# Patient Record
Sex: Female | Born: 1970 | Race: Black or African American | Hispanic: No | Marital: Single | Smoking: Current every day smoker
Health system: Southern US, Community
[De-identification: ages and names within clinical notes are randomized; demographics above are authoritative.]

## PROBLEM LIST (undated history)

## (undated) DIAGNOSIS — I10 Essential (primary) hypertension: Secondary | ICD-10-CM

## (undated) DIAGNOSIS — Z8669 Personal history of other diseases of the nervous system and sense organs: Secondary | ICD-10-CM

## (undated) DIAGNOSIS — K859 Acute pancreatitis without necrosis or infection, unspecified: Secondary | ICD-10-CM

## (undated) HISTORY — PX: LEEP: SHX91

---

## 1998-06-29 ENCOUNTER — Other Ambulatory Visit: Admission: RE | Admit: 1998-06-29 | Discharge: 1998-06-29 | Payer: Self-pay | Admitting: Obstetrics

## 1998-09-14 ENCOUNTER — Other Ambulatory Visit: Admission: RE | Admit: 1998-09-14 | Discharge: 1998-09-14 | Payer: Self-pay | Admitting: Obstetrics & Gynecology

## 1998-09-14 ENCOUNTER — Encounter: Admission: RE | Admit: 1998-09-14 | Discharge: 1998-09-14 | Payer: Self-pay | Admitting: Obstetrics & Gynecology

## 1998-10-19 ENCOUNTER — Encounter: Admission: RE | Admit: 1998-10-19 | Discharge: 1998-10-19 | Payer: Self-pay | Admitting: Obstetrics & Gynecology

## 1999-12-31 ENCOUNTER — Encounter: Admission: RE | Admit: 1999-12-31 | Discharge: 1999-12-31 | Payer: Self-pay | Admitting: Obstetrics & Gynecology

## 2000-02-18 ENCOUNTER — Encounter: Admission: RE | Admit: 2000-02-18 | Discharge: 2000-02-18 | Payer: Self-pay | Admitting: Obstetrics & Gynecology

## 2001-02-02 ENCOUNTER — Other Ambulatory Visit: Admission: RE | Admit: 2001-02-02 | Discharge: 2001-02-02 | Payer: Self-pay | Admitting: Internal Medicine

## 2001-11-12 ENCOUNTER — Emergency Department (HOSPITAL_COMMUNITY): Admission: EM | Admit: 2001-11-12 | Discharge: 2001-11-12 | Payer: Self-pay

## 2002-07-11 ENCOUNTER — Emergency Department (HOSPITAL_COMMUNITY): Admission: EM | Admit: 2002-07-11 | Discharge: 2002-07-11 | Payer: Self-pay | Admitting: Emergency Medicine

## 2002-10-18 ENCOUNTER — Inpatient Hospital Stay (HOSPITAL_COMMUNITY): Admission: AD | Admit: 2002-10-18 | Discharge: 2002-10-18 | Payer: Self-pay | Admitting: *Deleted

## 2004-08-05 ENCOUNTER — Inpatient Hospital Stay (HOSPITAL_COMMUNITY): Admission: AD | Admit: 2004-08-05 | Discharge: 2004-08-05 | Payer: Self-pay | Admitting: Obstetrics & Gynecology

## 2004-08-05 ENCOUNTER — Ambulatory Visit: Payer: Self-pay | Admitting: Certified Nurse Midwife

## 2004-10-12 ENCOUNTER — Ambulatory Visit: Payer: Self-pay | Admitting: Obstetrics and Gynecology

## 2004-10-12 ENCOUNTER — Inpatient Hospital Stay (HOSPITAL_COMMUNITY): Admission: AD | Admit: 2004-10-12 | Discharge: 2004-10-14 | Payer: Self-pay | Admitting: Obstetrics & Gynecology

## 2005-05-23 ENCOUNTER — Emergency Department (HOSPITAL_COMMUNITY): Admission: EM | Admit: 2005-05-23 | Discharge: 2005-05-23 | Payer: Self-pay | Admitting: Emergency Medicine

## 2005-05-23 IMAGING — US US OB COMP LESS 14 WK
1 series · 14 of 28 positions shown · non-contrast
Comparison: None.

CLINICAL DATA: Vaginal bleeding since [DATE].  Beta hCG [ZR] today.
OBSTETRICAL ULTRASOUND <14 WKS AND TRANSVAGINAL OB US:
TECHNIQUE: Both transabdominal and transvaginal ultrasound examinations were performed for complete evaluation of the gestation as well as the maternal uterus, adnexal regions, and pelvic cul-de-sac.

[Series 1: unknown · 0.28mm/px · 14 of 58 slices shown]
[im 3/58]
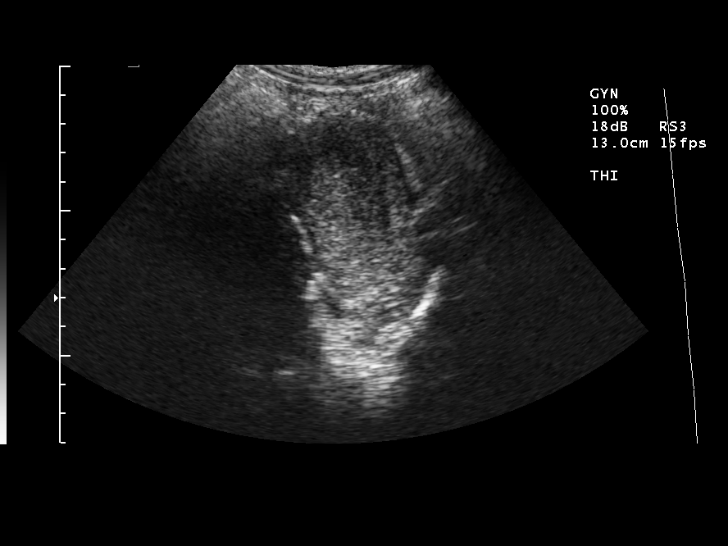
[im 7/58]
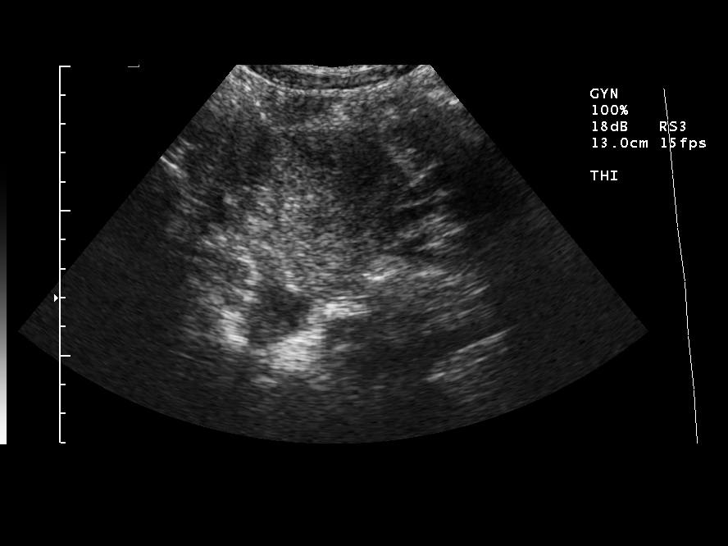
[im 11/58]
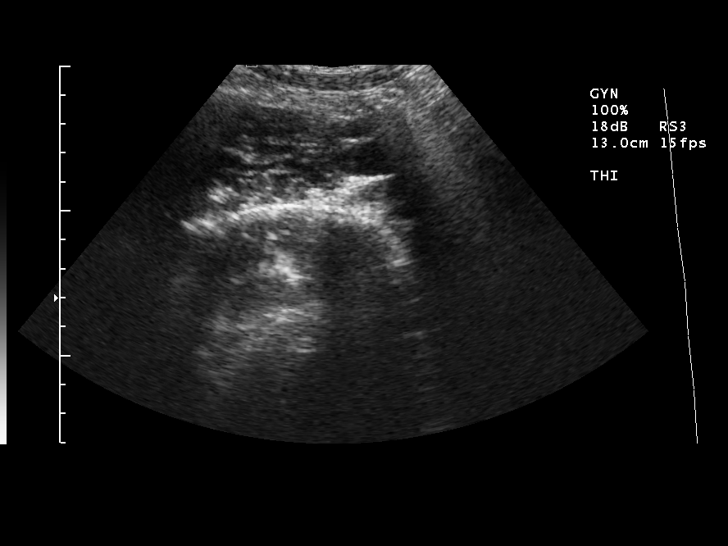
[im 15/58]
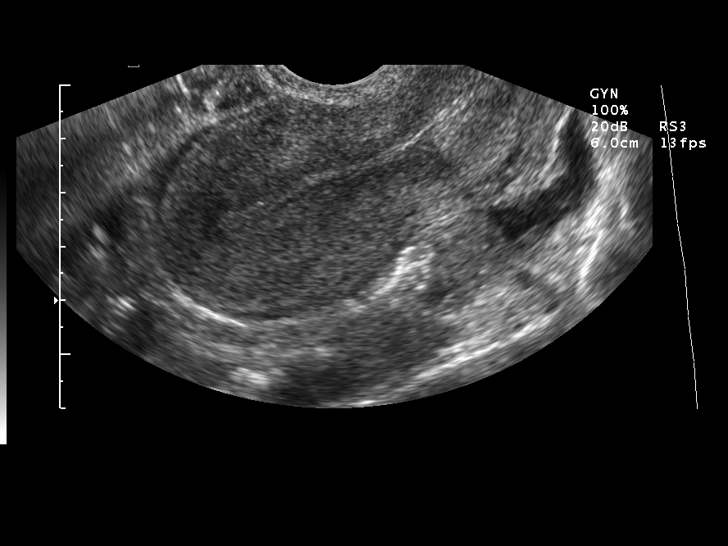
[im 20/58]
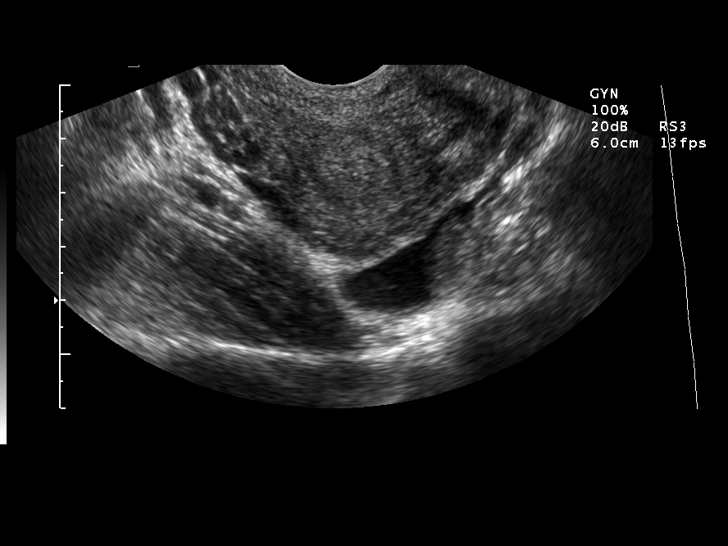
[im 24/58]
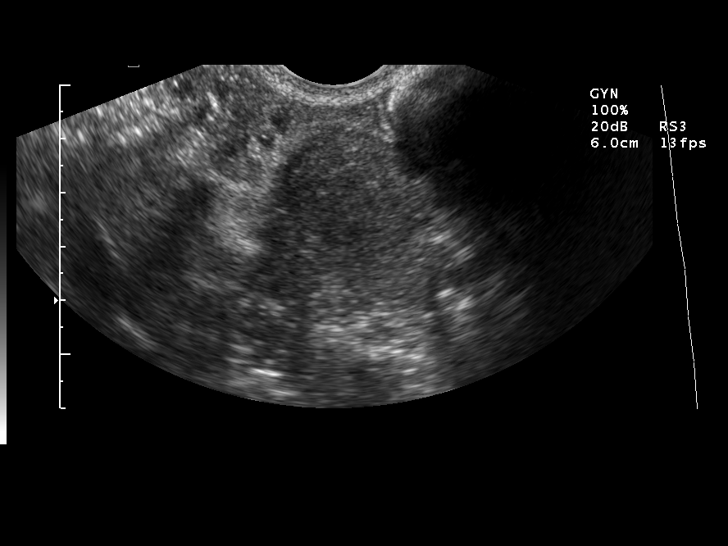
[im 28/58]
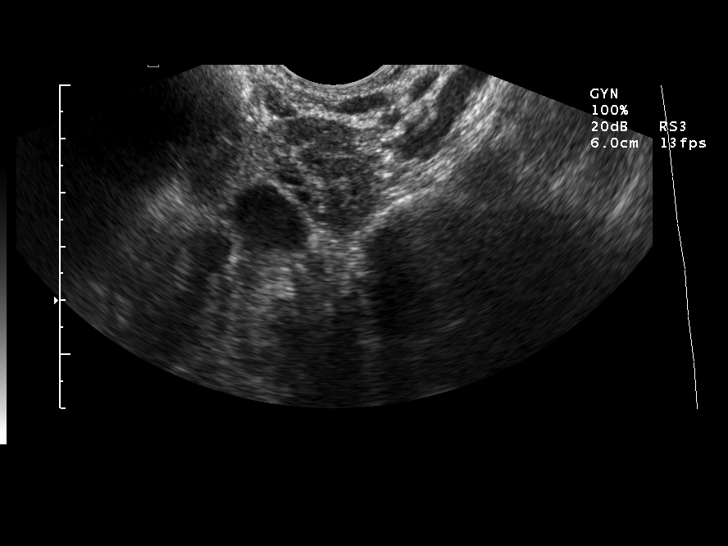
[im 32/58]
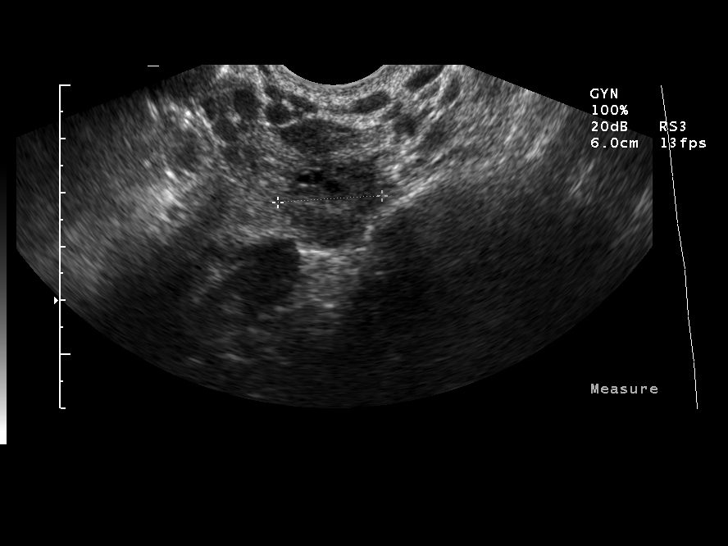
[im 36/58]
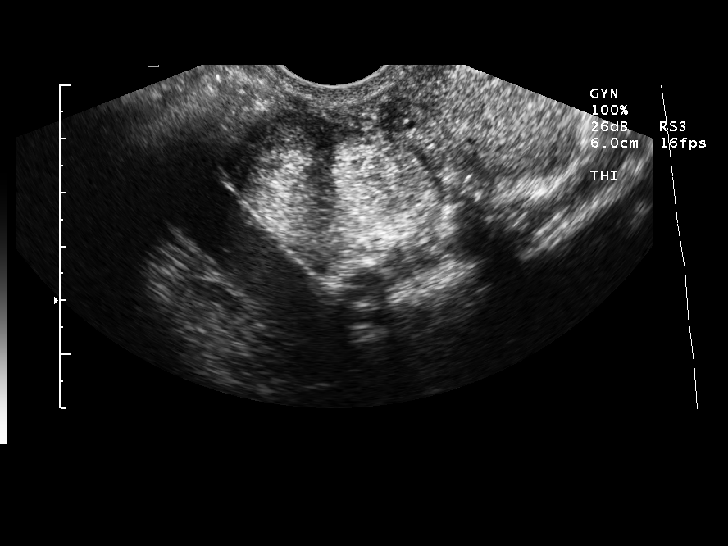
[im 41/58]
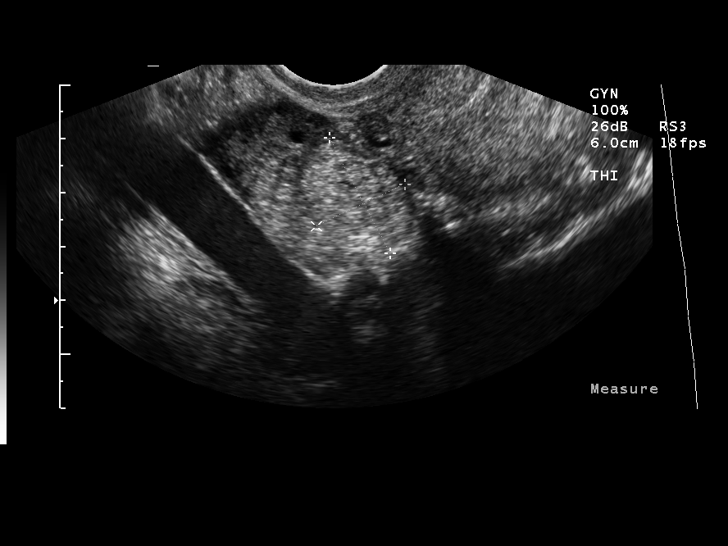
[im 45/58]
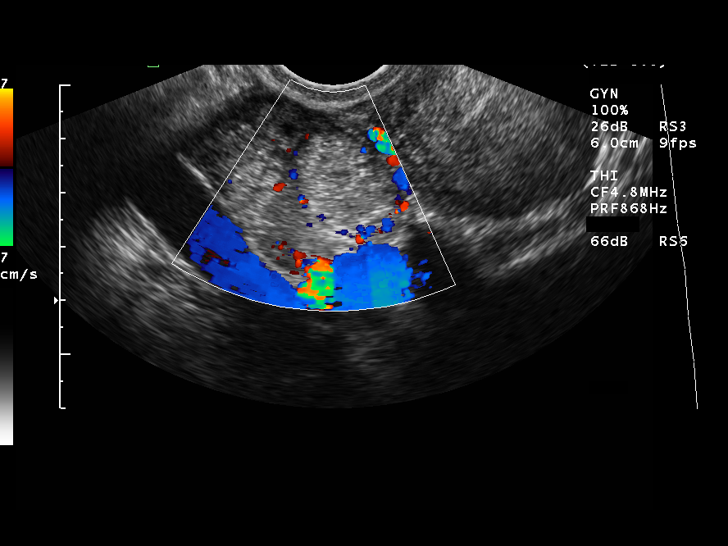
[im 49/58]
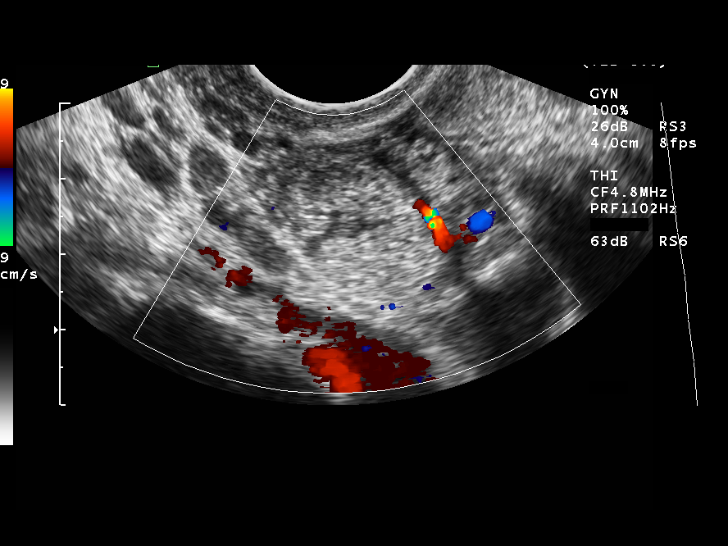
[im 53/58]
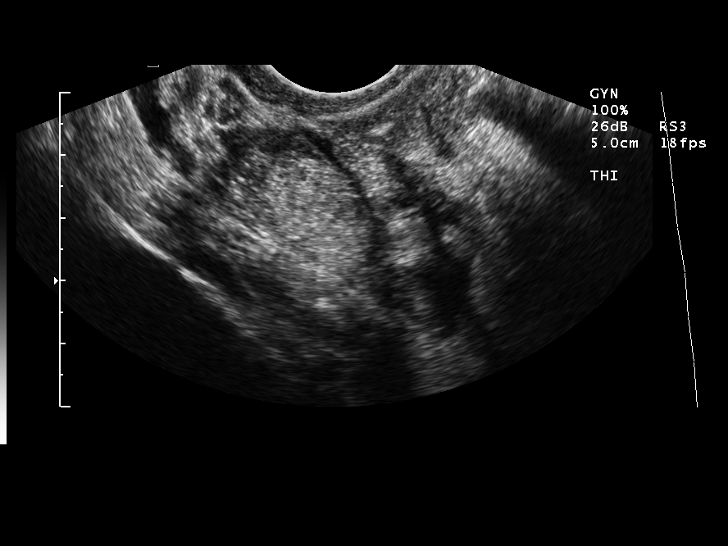
[im 58/58]
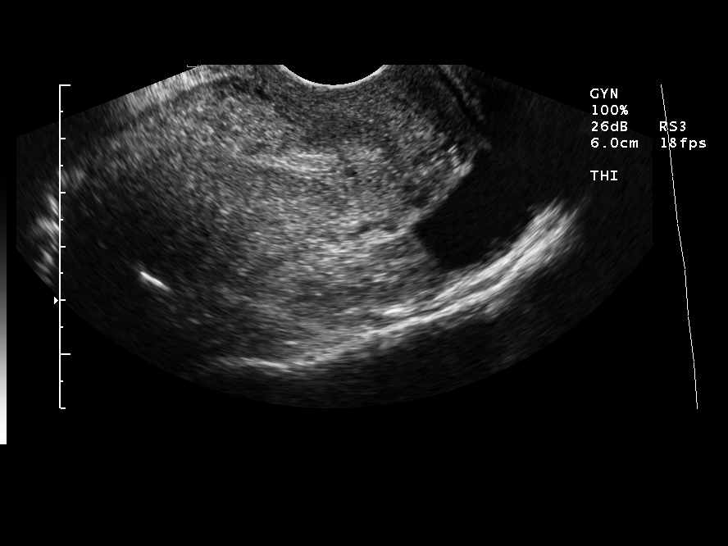

[14 of 28 positions shown; findings below may reference images not displayed]

FINDINGS: No intrauterine gestational sac is identified.  Endometrium measures 3.9 mm and is normal.  
The left ovary measures 25 x 18 mm and appears normal.  The right ovary measures 24 x 18 mm and appears normal with some small collapsed follicles.  There is a small amount of free fluid.  No adnexal mass is identified.
IMPRESSION: Negative for intrauterine pregnancy.  Findings could be due to ectopic pregnancy given the small amount of free fluid.  It is also possible that the patient has had a spontaneous abortion.  Continued  follow-up beta hCG and ultrasound is suggested.

## 2005-11-08 ENCOUNTER — Emergency Department (HOSPITAL_COMMUNITY): Admission: EM | Admit: 2005-11-08 | Discharge: 2005-11-08 | Payer: Self-pay | Admitting: Emergency Medicine

## 2005-11-08 IMAGING — CR DG HAND COMPLETE 3+V*R*
3 series · 3 of 3 positions shown · non-contrast
Comparison: none

CLINICAL DATA: 35 year old with question of fracture.  Right hand pain.
 RIGHT HAND COMPLETE ? 3 VIEWS:

[view not recorded (1 of 3)]
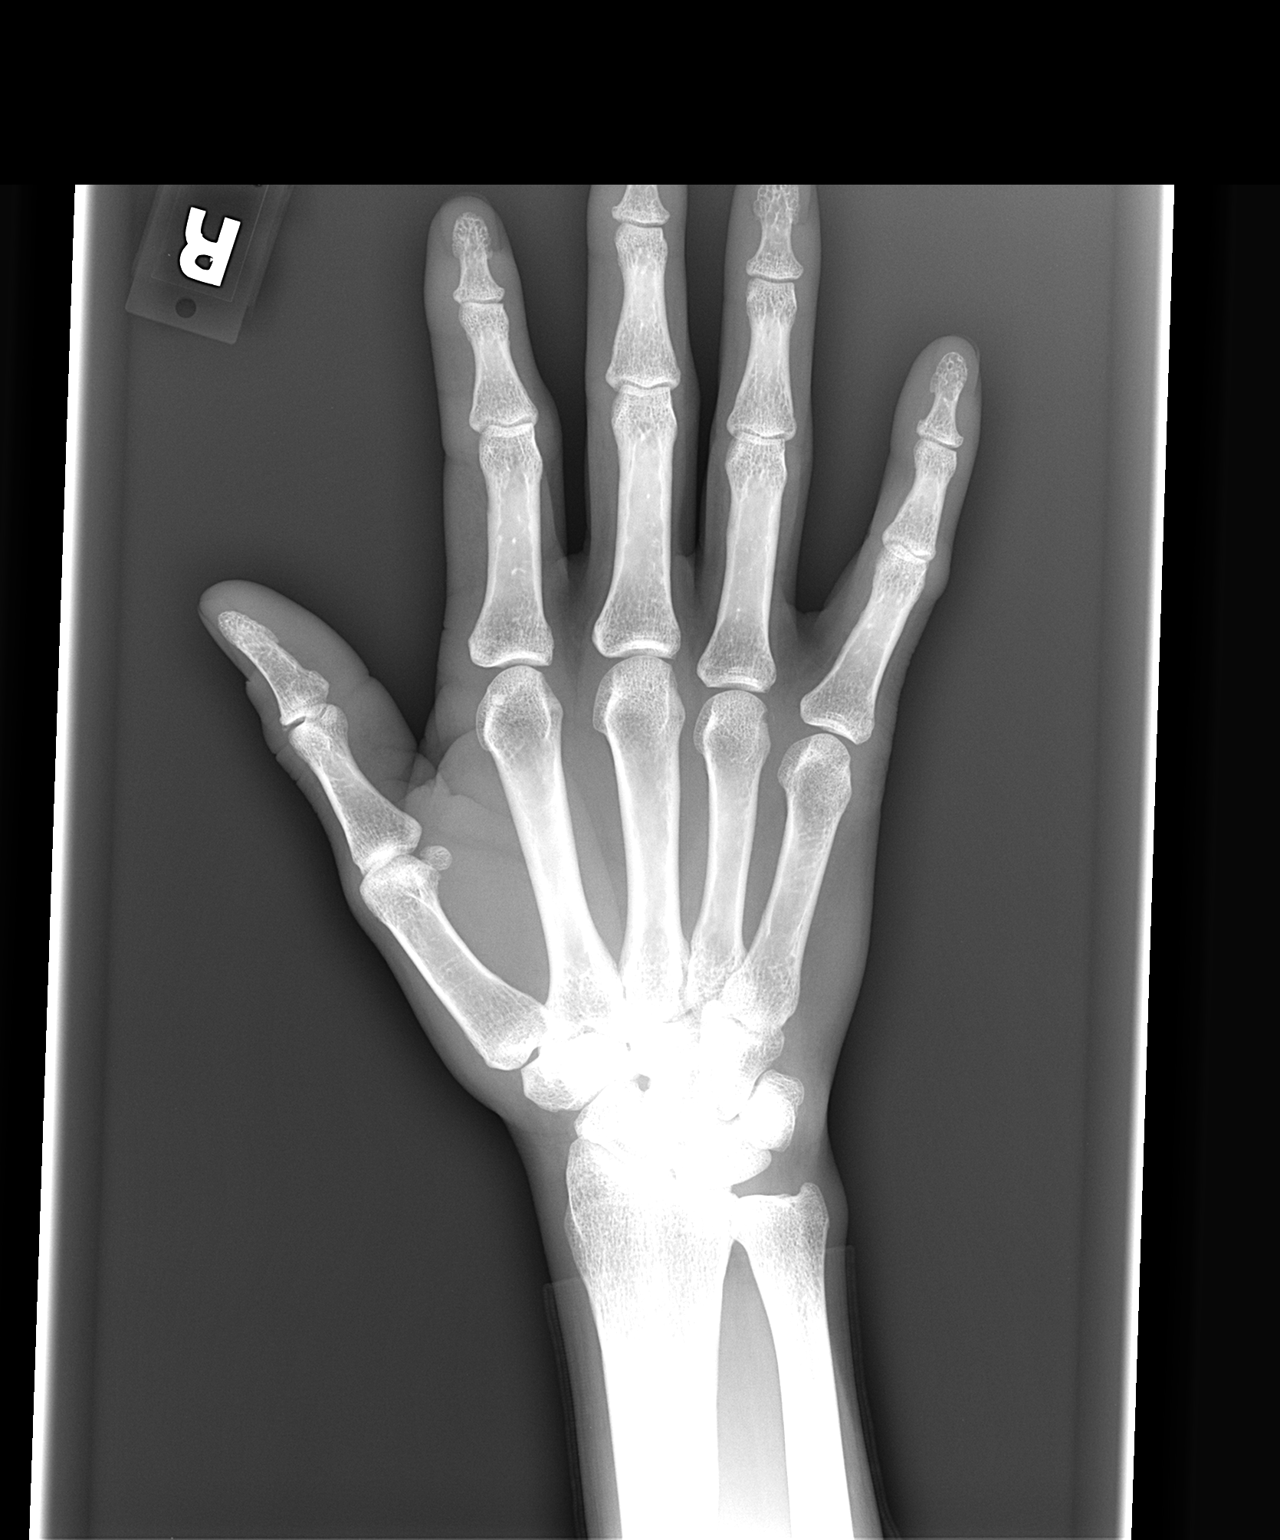

[view not recorded (2 of 3)]
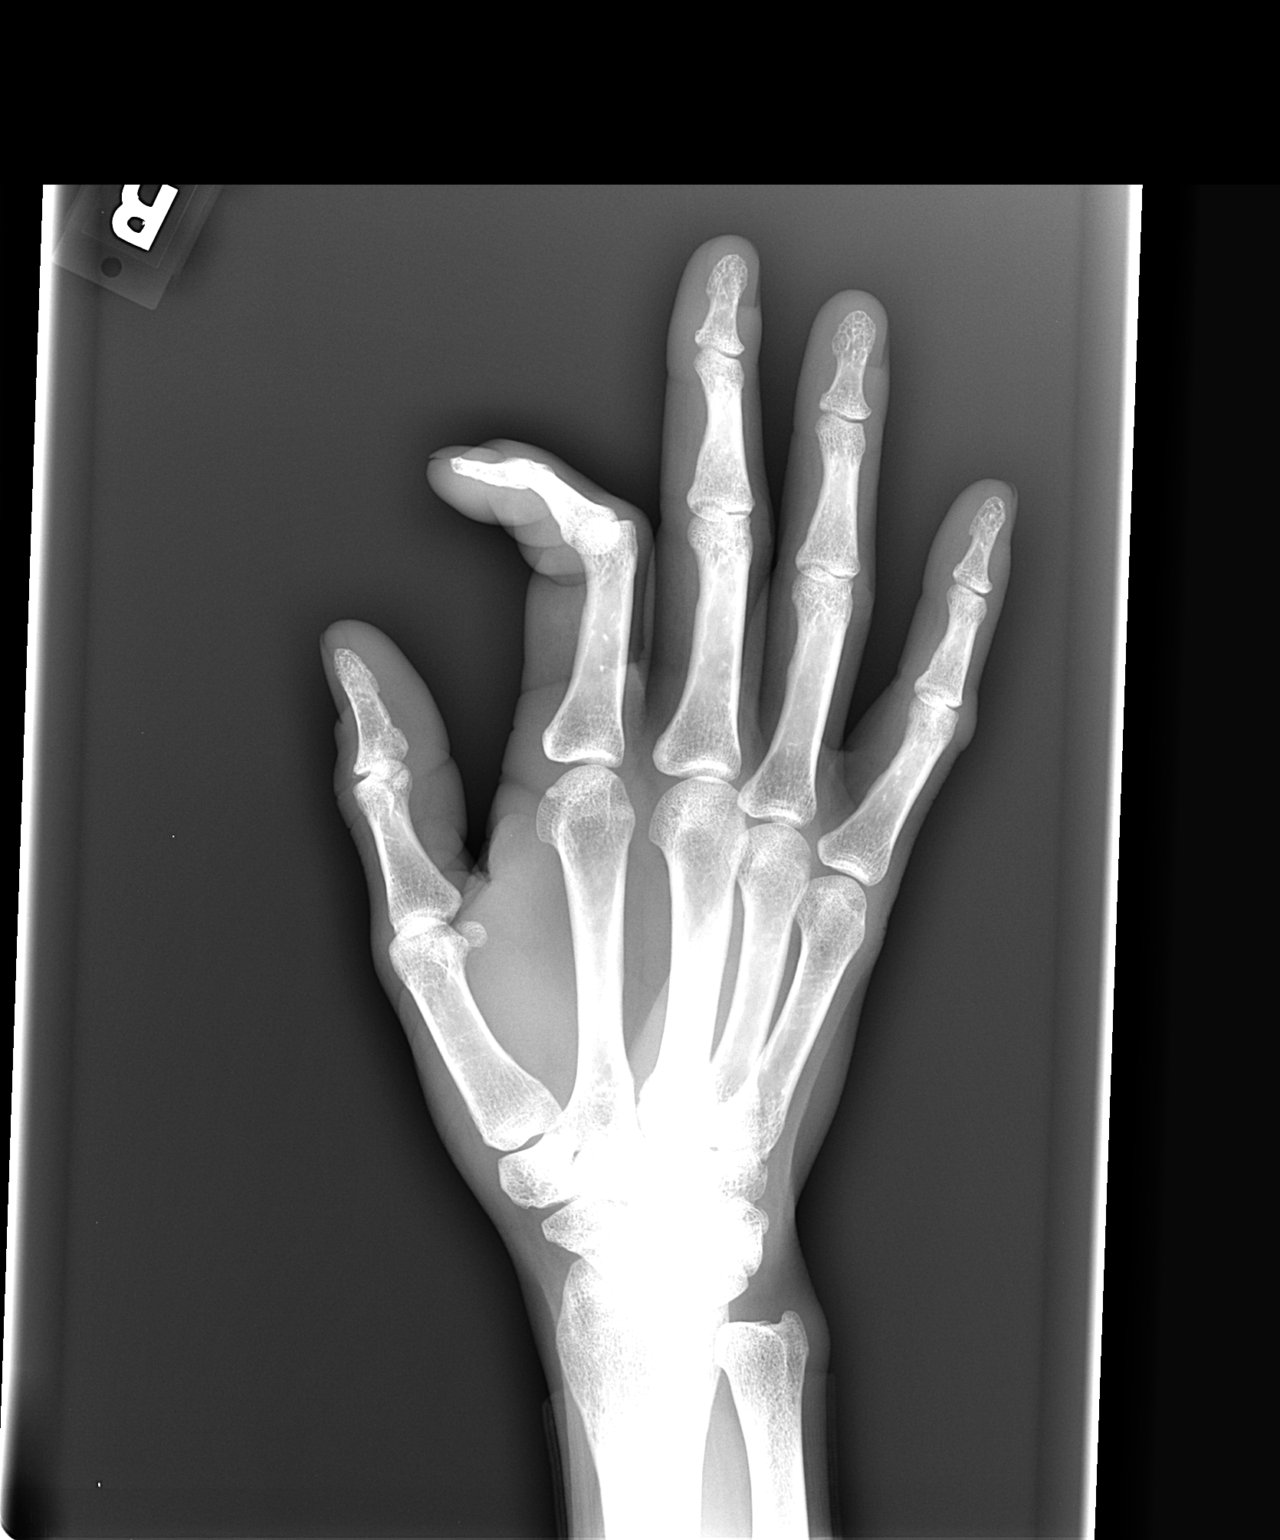

[view not recorded (3 of 3)]
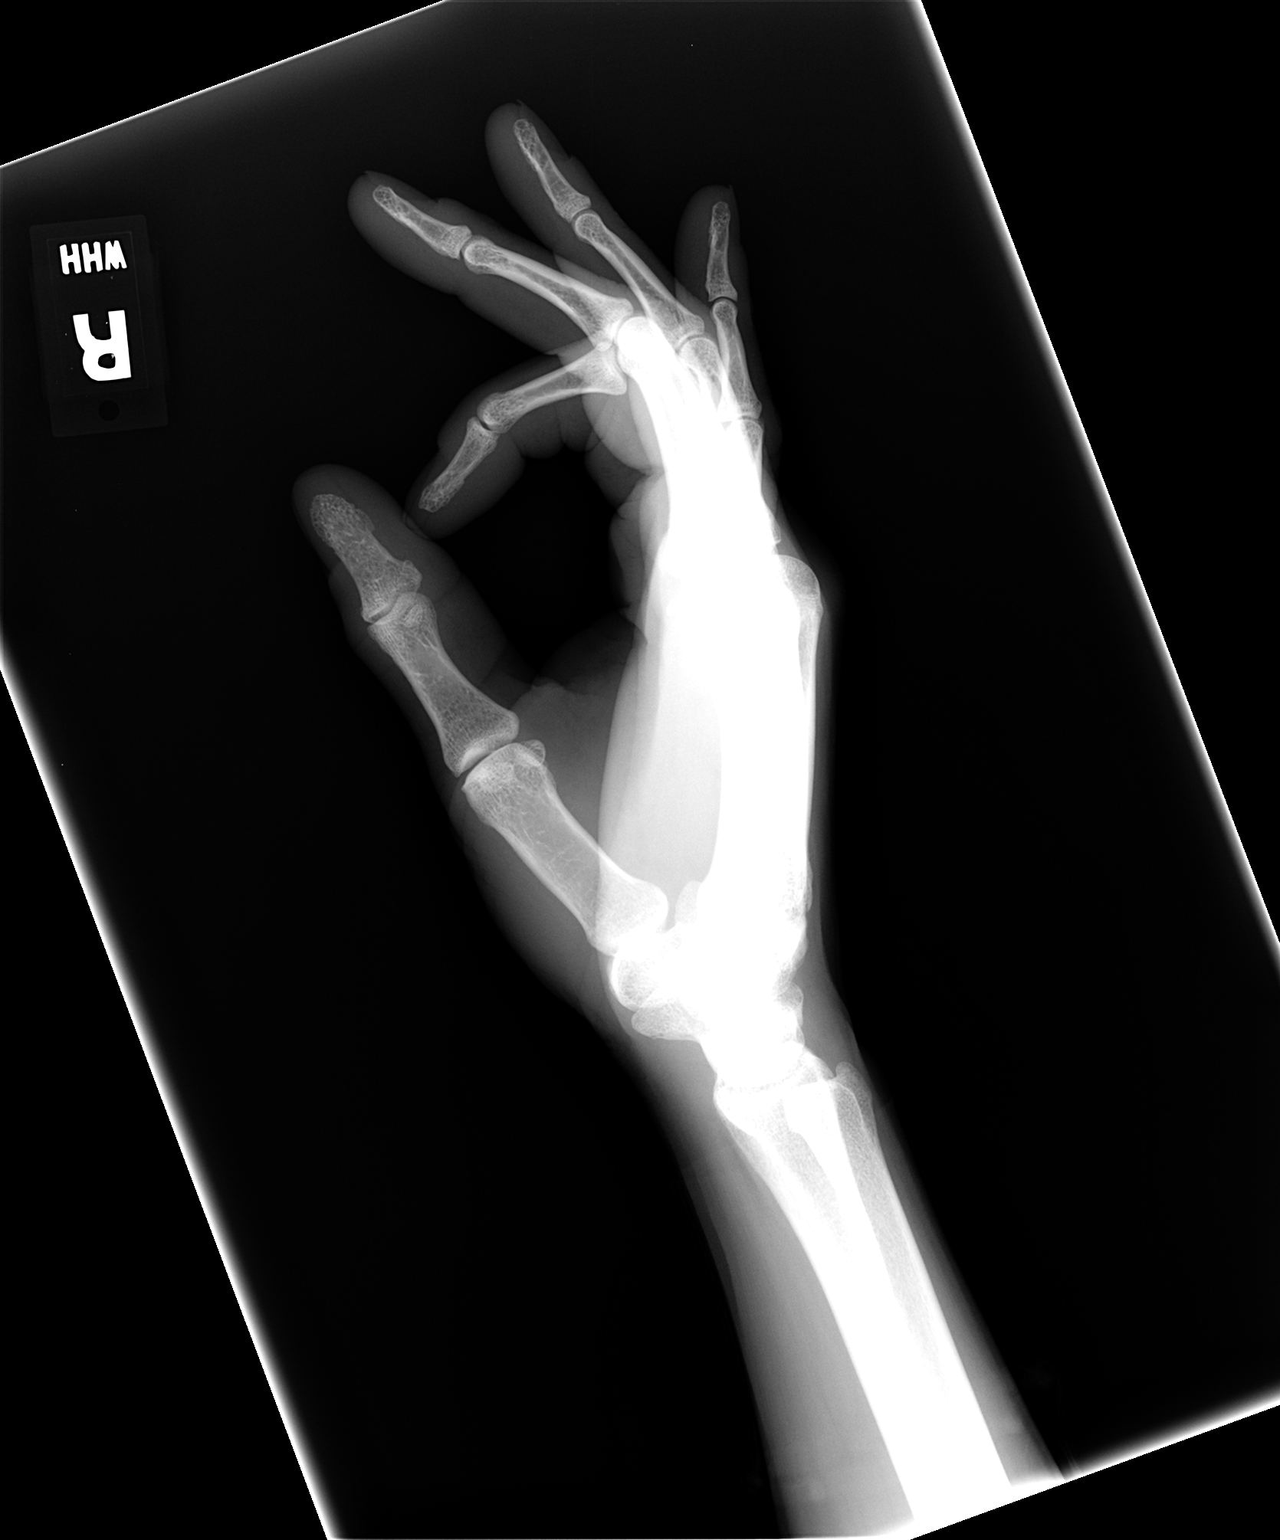

[3 of 3 positions shown; findings below may reference images not displayed]

FINDINGS: Two of the three views are performed with the patient flexing the second digit at the proximal interphalangeal joint.  There is no evidence for acute fracture or dislocation otherwise.
IMPRESSION: No evidence for acute abnormality.

## 2006-04-15 ENCOUNTER — Emergency Department (HOSPITAL_COMMUNITY): Admission: EM | Admit: 2006-04-15 | Discharge: 2006-04-15 | Payer: Self-pay | Admitting: Emergency Medicine

## 2006-12-26 ENCOUNTER — Emergency Department (HOSPITAL_COMMUNITY): Admission: EM | Admit: 2006-12-26 | Discharge: 2006-12-26 | Payer: Self-pay | Admitting: Emergency Medicine

## 2006-12-26 IMAGING — CR DG HAND COMPLETE 3+V*R*
3 series · 3 of 3 positions shown · non-contrast
Comparison: [DATE]

CLINICAL DATA: Trauma

RIGHT HAND - 3 VIEW

[x hand pa right]
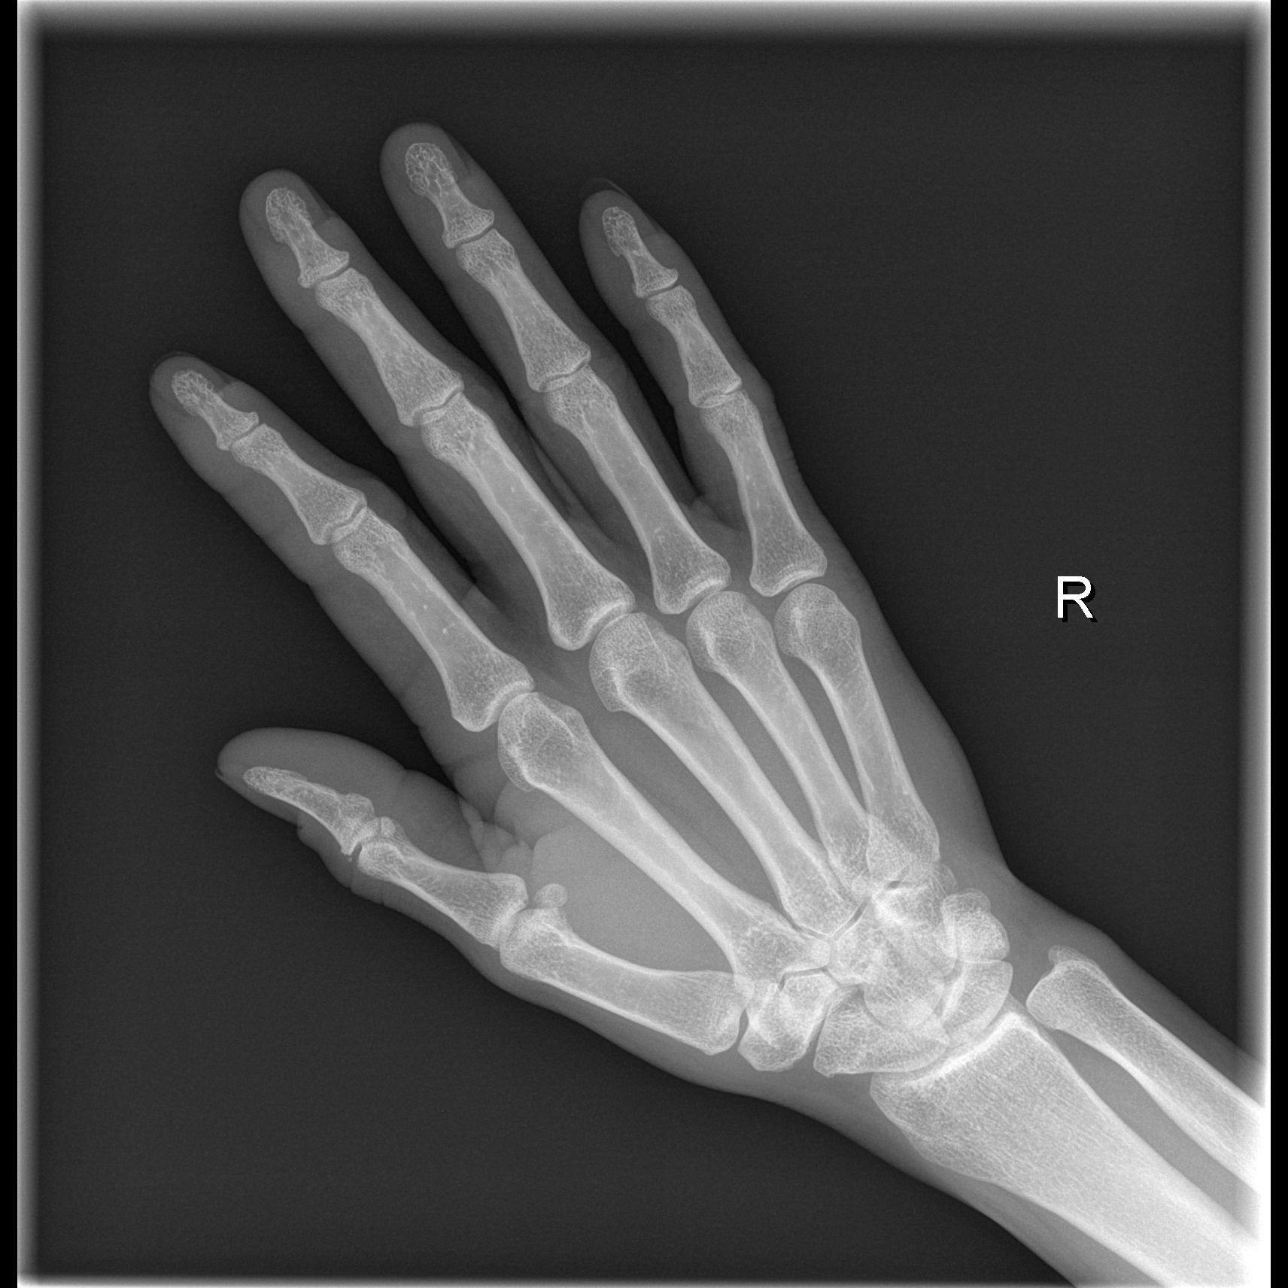

[x hand oblique right]
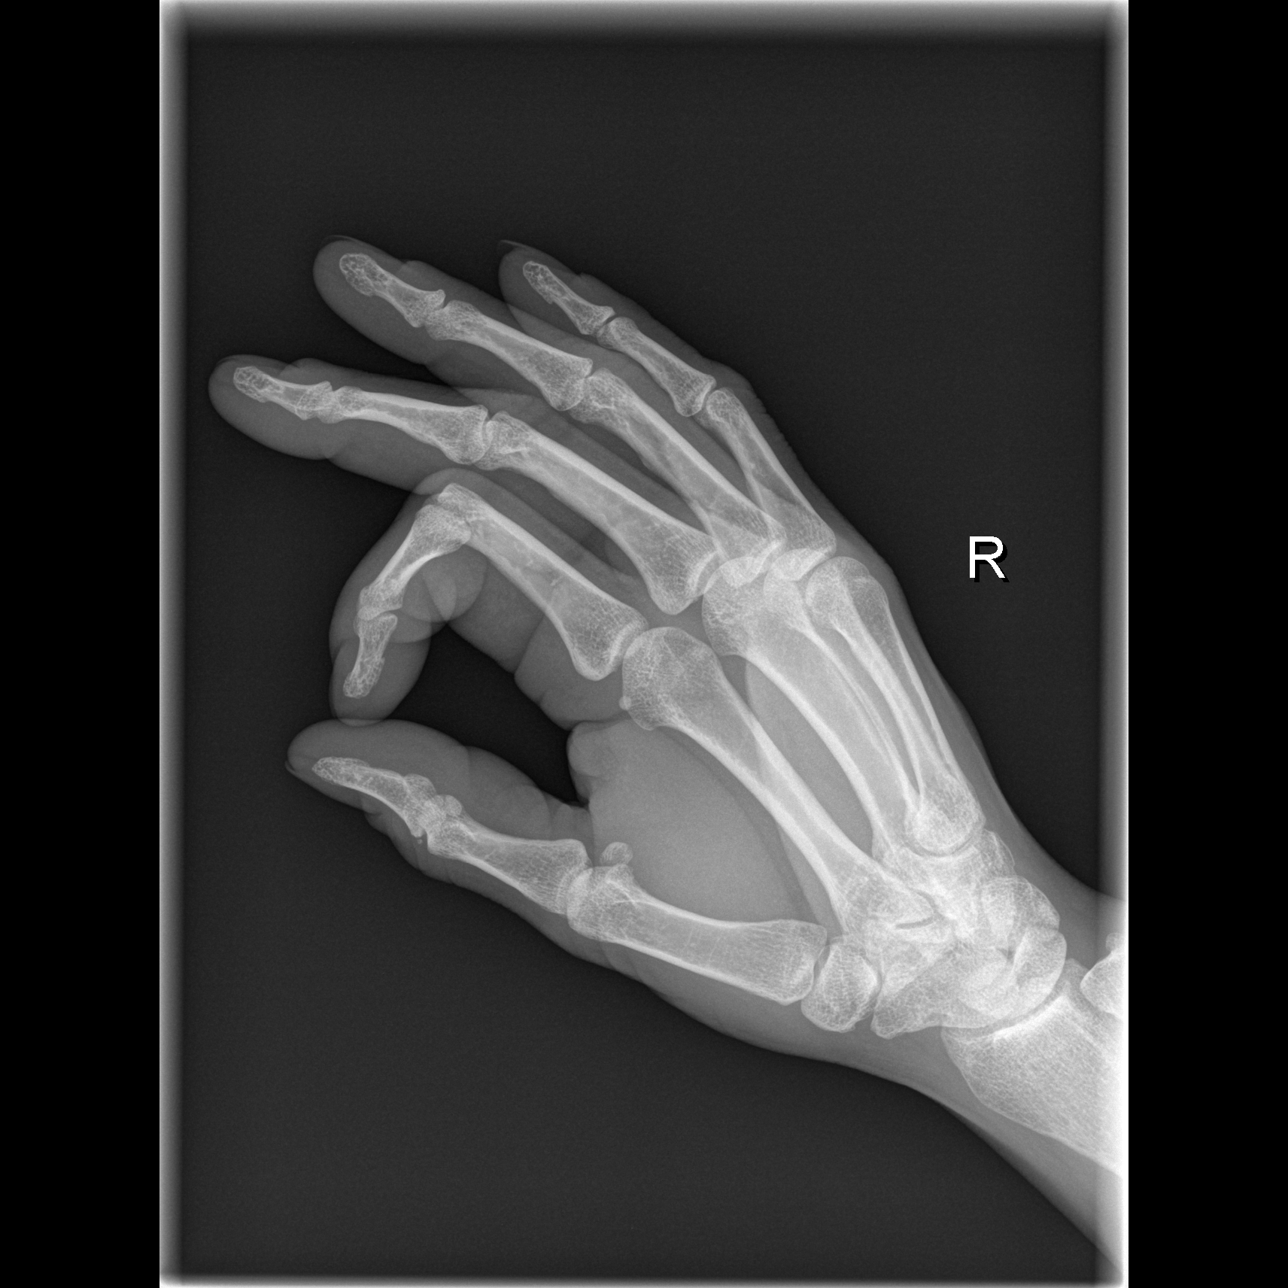

[x hand lat right]
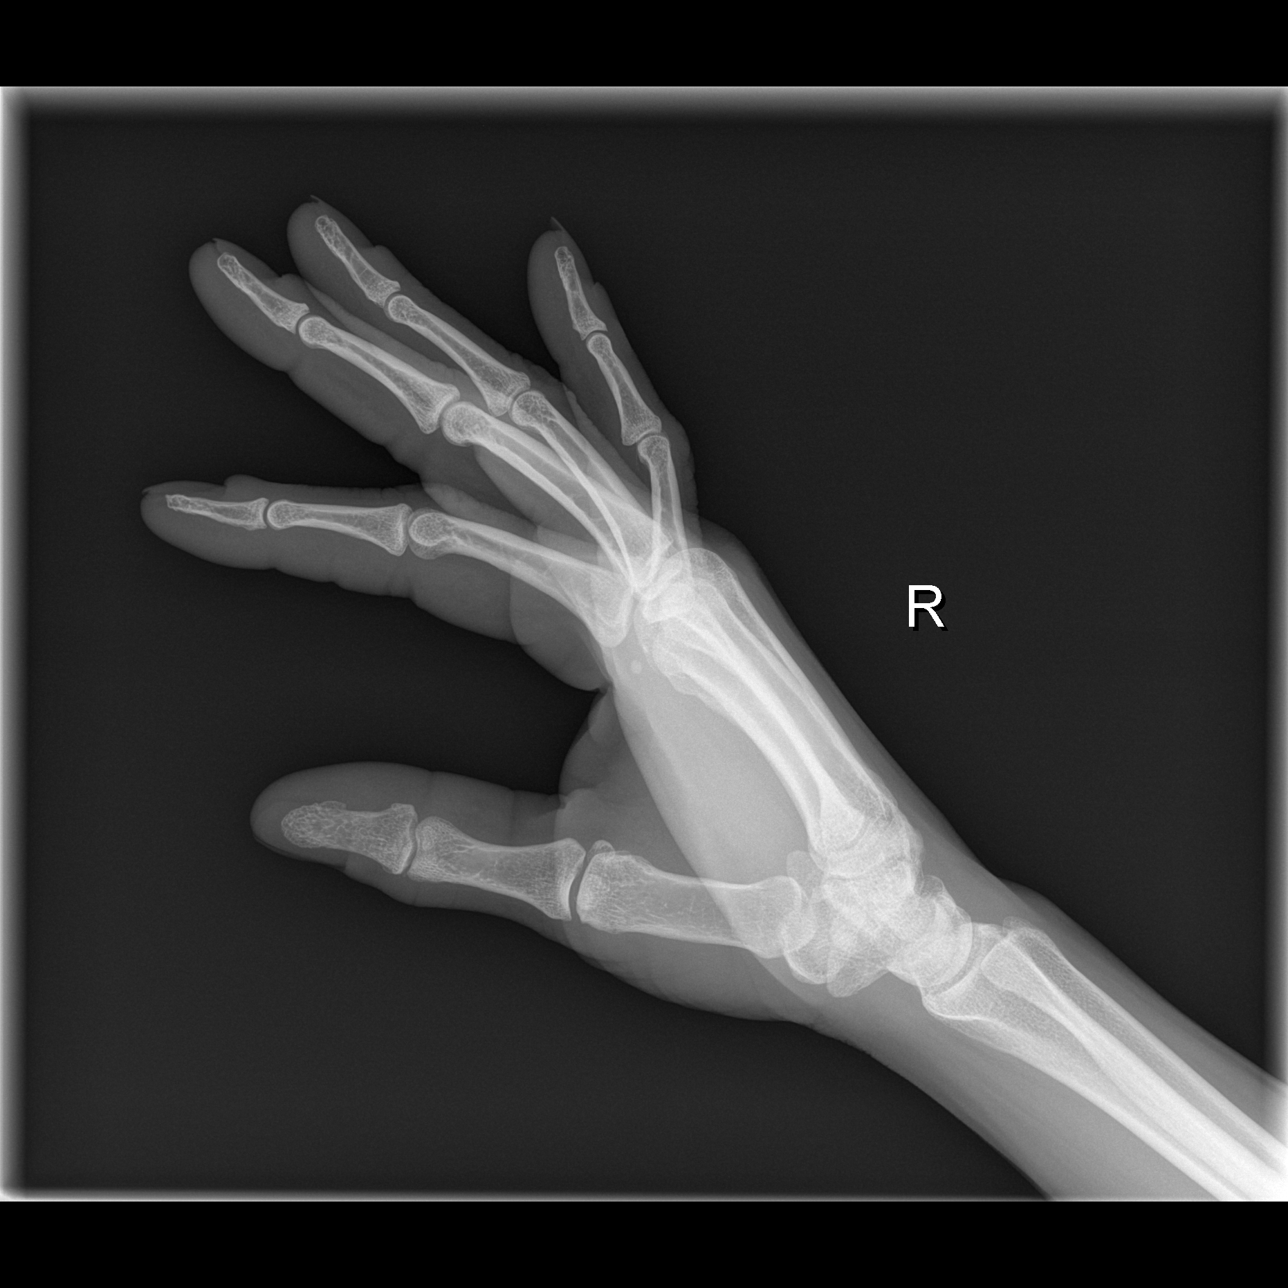

[3 of 3 positions shown; findings below may reference images not displayed]

FINDINGS: No fracture, dislocation, or significant soft tissue swelling.

IMPRESSION

No acute findings about the right hand.

## 2007-02-25 ENCOUNTER — Emergency Department (HOSPITAL_COMMUNITY): Admission: EM | Admit: 2007-02-25 | Discharge: 2007-02-26 | Payer: Self-pay | Admitting: Emergency Medicine

## 2007-02-26 ENCOUNTER — Inpatient Hospital Stay (HOSPITAL_COMMUNITY): Admission: AD | Admit: 2007-02-26 | Discharge: 2007-02-26 | Payer: Self-pay | Admitting: Obstetrics

## 2007-02-26 IMAGING — US US OB TRANSVAGINAL MODIFY
1 series · 14 of 28 positions shown · non-contrast
Comparison: None.

CLINICAL DATA: Pregnant female with vaginal bleeding.  
 OBSTETRICAL ULTRASOUND <14 WKS AND TRANSVAGINAL OB US:
TECHNIQUE: Both transabdominal and transvaginal ultrasound examinations were performed for complete evaluation of the gestation as well as the maternal uterus, adnexal regions, and pelvic cul-de-sac.

[Series 1: unknown · 0.22mm/px · 14 of 36 slices shown]
[im 2/36]
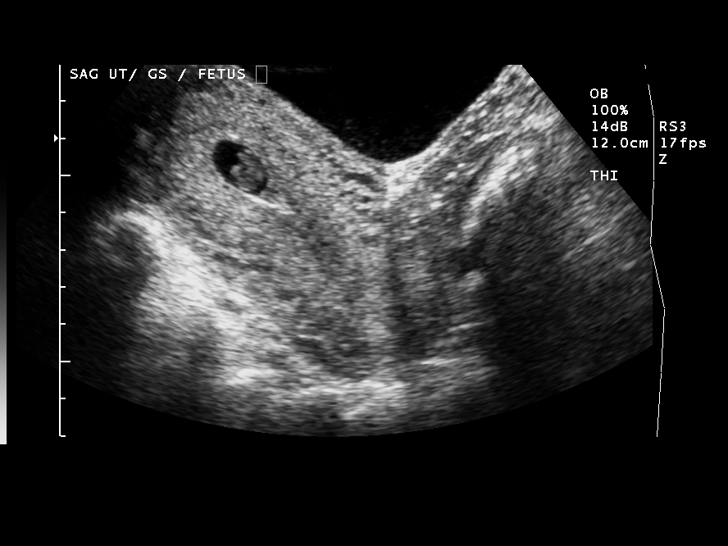
[im 4/36]
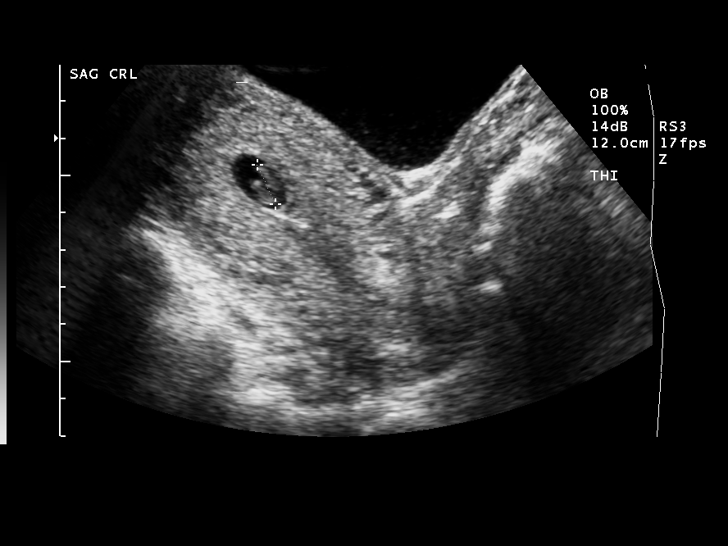
[im 7/36]
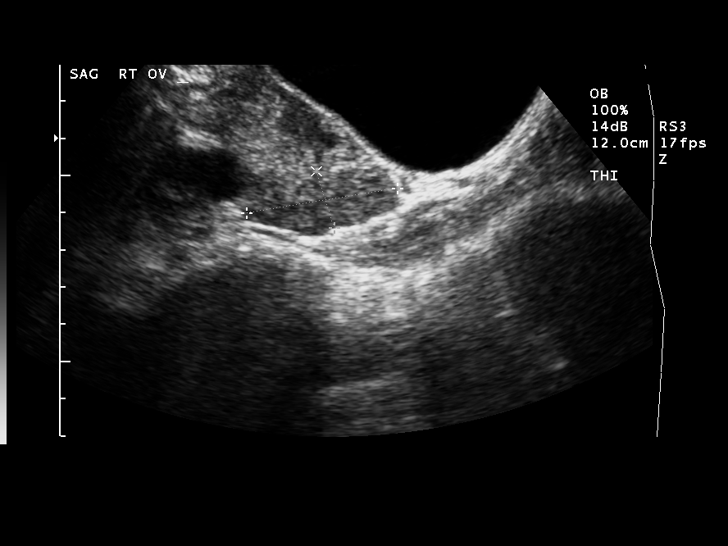
[im 10/36]
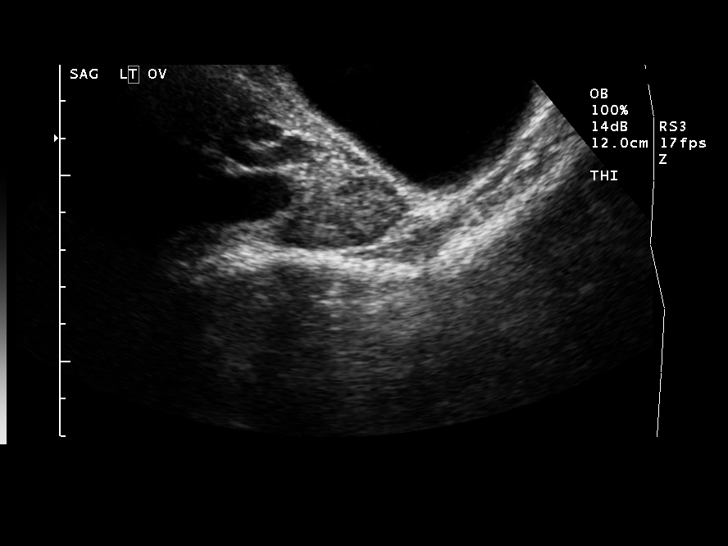
[im 12/36]
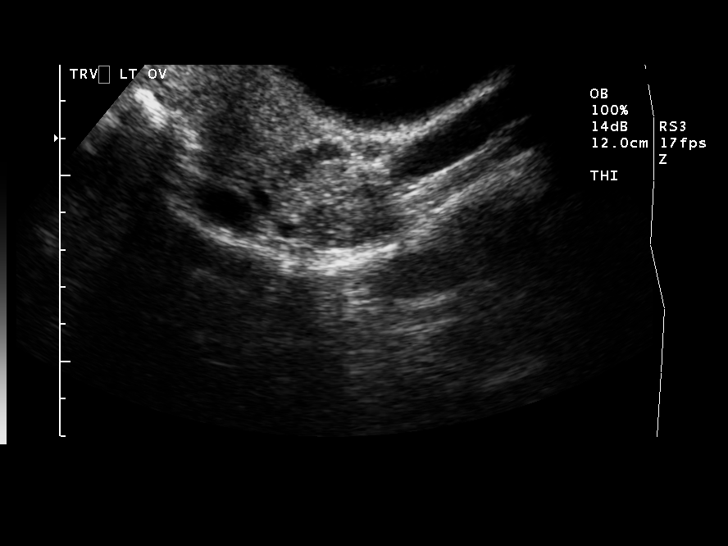
[im 15/36]
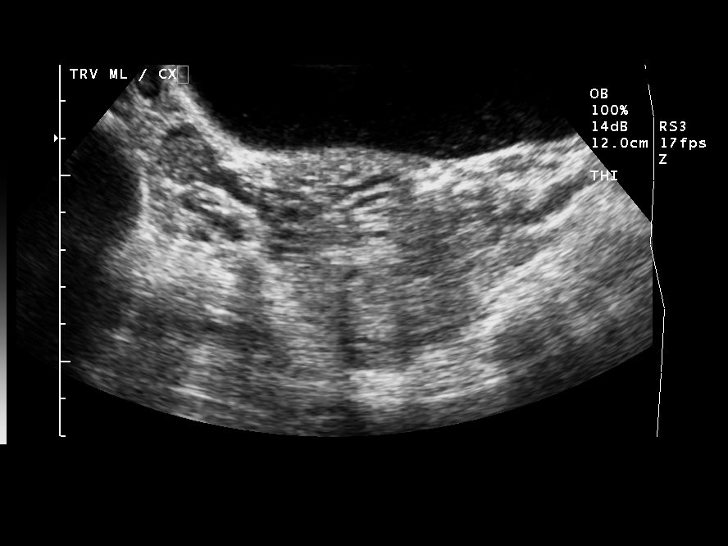
[im 17/36]
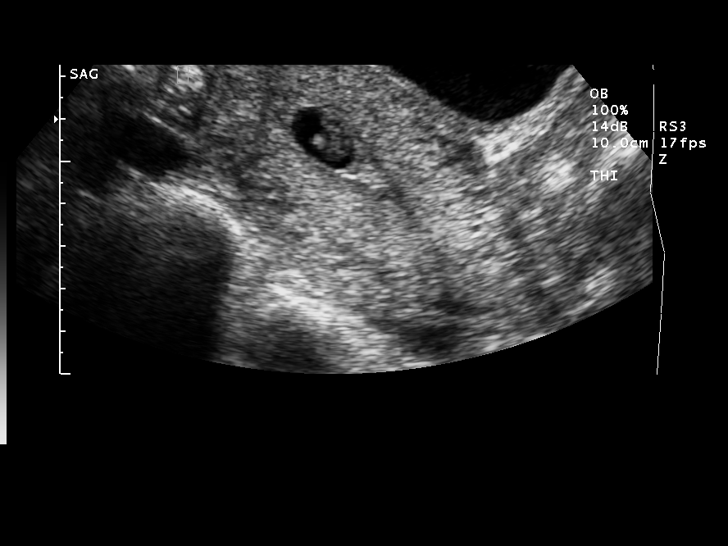
[im 20/36]
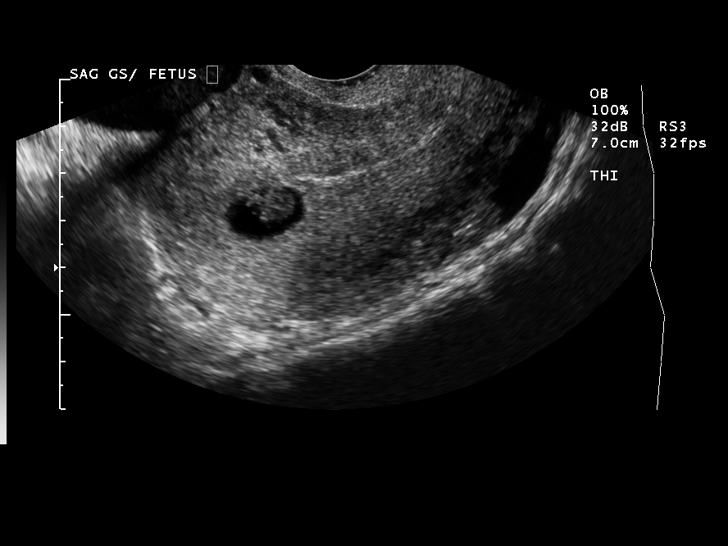
[im 23/36]
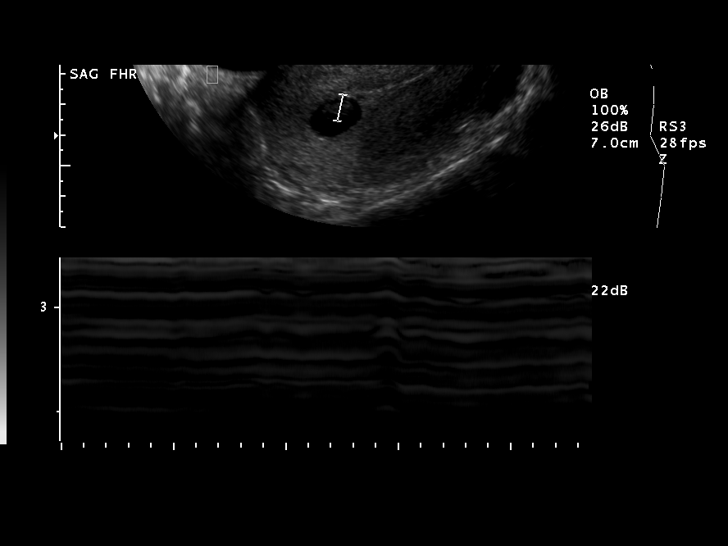
[im 25/36]
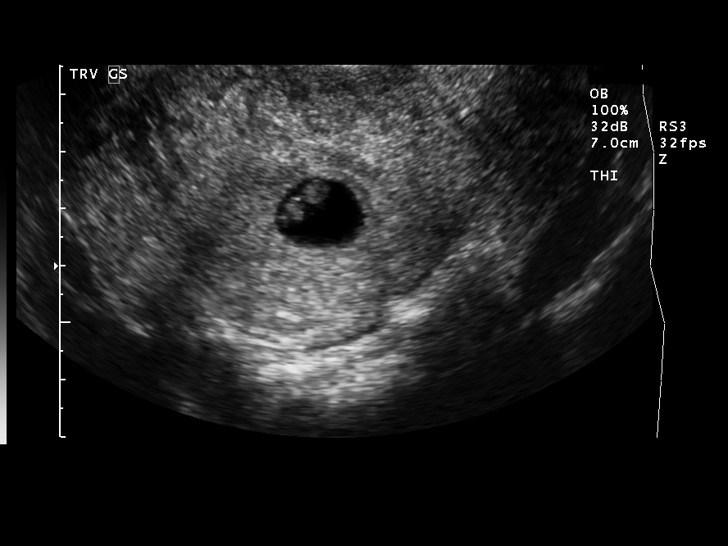
[im 28/36]
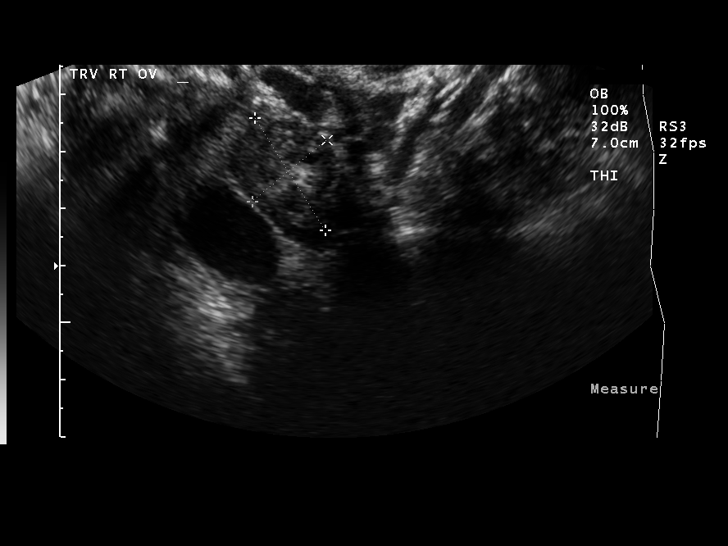
[im 30/36]
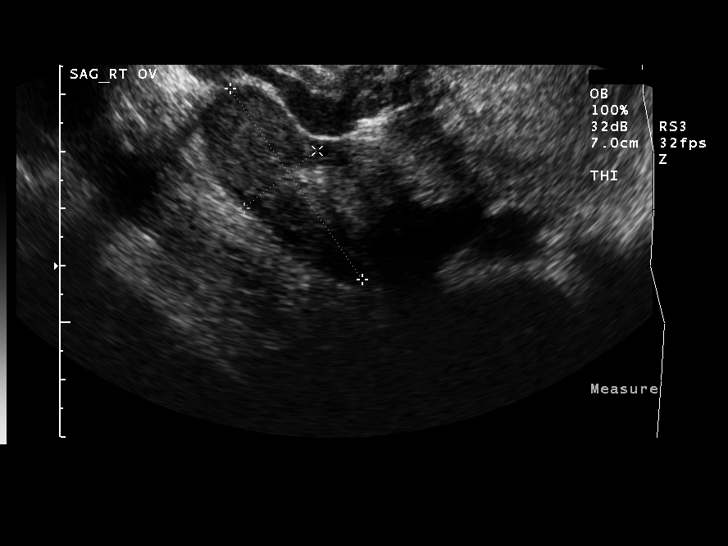
[im 33/36]
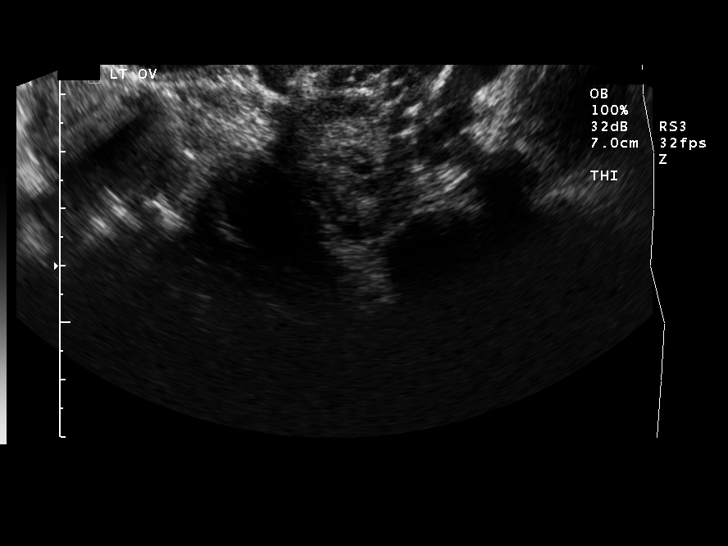
[im 36/36]
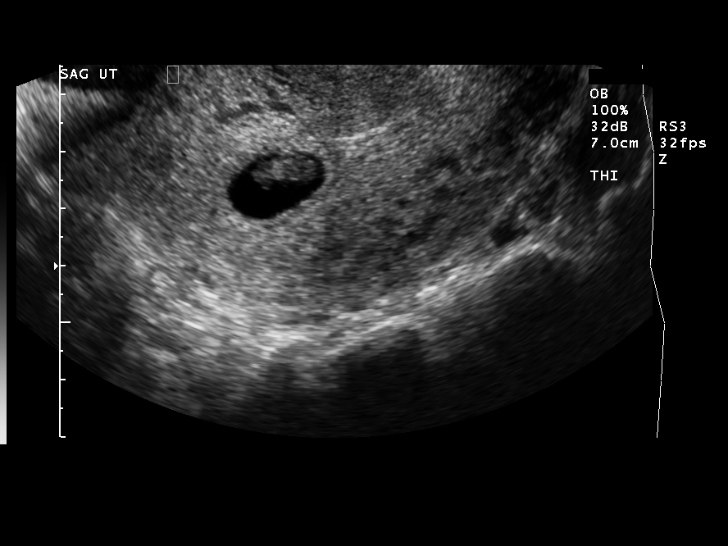

[14 of 28 positions shown; findings below may reference images not displayed]

FINDINGS: A single intrauterine pregnancy is identified.  Crown-rump length of the embryo is 11.8 mm for an estimated gestational age of 7 weeks 2 days.  No cardiac activity is detected.  No yolk sac is visualized.  Right ovary measures 4.1 x 2.3 x 1.7 cm.  The left ovary measures 3.7 x 1.8 x 1.4 cm.  Both ovaries are unremarkable.  No adnexal mass or pelvic fluid.
IMPRESSION: Single intrauterine pregnancy with no cardiac activity or yolk sac compatible with fetal demise.  Recommend correlation serial hCG and follow-up ultrasound as indicated.

## 2007-02-28 ENCOUNTER — Inpatient Hospital Stay (HOSPITAL_COMMUNITY): Admission: AD | Admit: 2007-02-28 | Discharge: 2007-02-28 | Payer: Self-pay | Admitting: Obstetrics & Gynecology

## 2007-03-02 ENCOUNTER — Encounter: Payer: Self-pay | Admitting: Obstetrics & Gynecology

## 2007-03-02 ENCOUNTER — Ambulatory Visit: Payer: Self-pay | Admitting: Obstetrics & Gynecology

## 2007-03-02 ENCOUNTER — Ambulatory Visit (HOSPITAL_COMMUNITY): Admission: RE | Admit: 2007-03-02 | Discharge: 2007-03-02 | Payer: Self-pay | Admitting: Obstetrics & Gynecology

## 2007-04-02 ENCOUNTER — Ambulatory Visit: Payer: Self-pay | Admitting: *Deleted

## 2007-06-15 ENCOUNTER — Ambulatory Visit: Payer: Self-pay | Admitting: Obstetrics & Gynecology

## 2007-08-24 ENCOUNTER — Ambulatory Visit: Payer: Self-pay | Admitting: Obstetrics & Gynecology

## 2007-11-11 ENCOUNTER — Ambulatory Visit: Payer: Self-pay | Admitting: Obstetrics & Gynecology

## 2008-02-11 ENCOUNTER — Ambulatory Visit: Payer: Self-pay | Admitting: Obstetrics & Gynecology

## 2008-02-11 ENCOUNTER — Encounter: Payer: Self-pay | Admitting: Family

## 2008-02-11 LAB — CONVERTED CEMR LAB
Clue Cells Wet Prep HPF POC: NONE SEEN
Trich, Wet Prep: NONE SEEN

## 2008-05-15 ENCOUNTER — Ambulatory Visit: Payer: Self-pay | Admitting: Obstetrics & Gynecology

## 2008-07-07 ENCOUNTER — Ambulatory Visit: Payer: Self-pay | Admitting: Obstetrics & Gynecology

## 2008-08-02 ENCOUNTER — Ambulatory Visit: Payer: Self-pay | Admitting: Obstetrics & Gynecology

## 2008-10-20 ENCOUNTER — Ambulatory Visit: Payer: Self-pay | Admitting: Obstetrics & Gynecology

## 2008-11-10 ENCOUNTER — Emergency Department (HOSPITAL_COMMUNITY): Admission: EM | Admit: 2008-11-10 | Discharge: 2008-11-10 | Payer: Self-pay | Admitting: Emergency Medicine

## 2009-01-09 ENCOUNTER — Ambulatory Visit: Payer: Self-pay | Admitting: Obstetrics & Gynecology

## 2009-04-03 ENCOUNTER — Ambulatory Visit: Payer: Self-pay | Admitting: Obstetrics & Gynecology

## 2009-06-28 ENCOUNTER — Ambulatory Visit: Payer: Self-pay | Admitting: Obstetrics and Gynecology

## 2009-06-28 LAB — CONVERTED CEMR LAB: Pap Smear: NEGATIVE

## 2009-07-05 ENCOUNTER — Encounter: Admission: RE | Admit: 2009-07-05 | Discharge: 2009-07-05 | Payer: Medicaid Other | Admitting: Obstetrics & Gynecology

## 2009-09-14 ENCOUNTER — Ambulatory Visit: Payer: Self-pay | Admitting: Obstetrics & Gynecology

## 2009-12-26 ENCOUNTER — Ambulatory Visit: Payer: Self-pay | Admitting: Obstetrics and Gynecology

## 2010-02-10 ENCOUNTER — Encounter: Payer: Self-pay | Admitting: Obstetrics & Gynecology

## 2010-02-25 ENCOUNTER — Ambulatory Visit: Payer: Self-pay | Admitting: Family Medicine

## 2010-03-12 ENCOUNTER — Ambulatory Visit: Payer: Self-pay

## 2010-03-13 ENCOUNTER — Ambulatory Visit: Payer: Self-pay

## 2010-03-15 ENCOUNTER — Ambulatory Visit (INDEPENDENT_AMBULATORY_CARE_PROVIDER_SITE_OTHER): Payer: Medicaid Other

## 2010-03-15 DIAGNOSIS — Z3049 Encounter for surveillance of other contraceptives: Secondary | ICD-10-CM

## 2010-04-01 LAB — POCT PREGNANCY, URINE: Preg Test, Ur: NEGATIVE

## 2010-04-04 ENCOUNTER — Ambulatory Visit (INDEPENDENT_AMBULATORY_CARE_PROVIDER_SITE_OTHER): Payer: Medicaid Other | Admitting: Physician Assistant

## 2010-04-04 ENCOUNTER — Encounter: Payer: Self-pay | Admitting: Obstetrics & Gynecology

## 2010-04-04 DIAGNOSIS — N760 Acute vaginitis: Secondary | ICD-10-CM

## 2010-04-04 DIAGNOSIS — B373 Candidiasis of vulva and vagina: Secondary | ICD-10-CM

## 2010-04-12 NOTE — Progress Notes (Signed)
NAME:  Mary Ramos, Mary Ramos NO.:  [id number]  MEDICAL RECORD NO.:  [id number]           PATIENT TYPE:  A  LOCATION:  [facility]                   FACILITY:  [facility]  PHYSICIAN:  Maylon Cos, CNM    DATE OF BIRTH:  [birth year]  DATE OF SERVICE:  04/04/2010                                 CLINIC NOTE  The patient presents with a chief complaint of vaginal discharge with odor.  The patient states the problem began about 2 weeks ago along with lower abdominal cramping and some fatigue.  The discharge is described as a thin, white, creamy mucousy discharge; no vaginal bleeding, burning; slight irritation and itching.  The patient was not on any recent antibiotics.  She did try over-the-counter Monistat treatment 2 days ago, experiencing no relief.  She has had yeast infections in the past and feels as though this is similar to those.  She also has a history of bacterial vaginosis, Chlamydia, and gonorrhea several years ago; all for which she has been treated.  The patient denies any fevers, chills, dysuria, frequency, blood in the urine, or back pain.  PHYSICAL EXAMINATION:  GENERAL:  The patient is alert and oriented, in no acute distress. PELVIC:  Mild white discharge.  No blood in the vaginal vault or any other fluid.  No cervical motion tenderness and moderate odor noted during exam.  Wet prep performed as well as cultures, and mild clue cells were found as well as hyphae; no evidence of Trichomonas.  ASSESSMENT: 1. Bacterial vaginosis. 2. Candidiasis.  PLAN:  The patient was given prescription for Flagyl 500 mg one p.o. b.i.d., #14, and counseled about not using alcohol during this time and for 48 hours after treatment and also given a prescription for Diflucan.          ______________________________ Maylon Cos, CNM    SS/MEDQ  D:  04/04/2010  T:  04/05/2010  Job:  [id number]

## 2010-04-22 ENCOUNTER — Ambulatory Visit: Payer: Medicaid Other | Admitting: Occupational Therapy

## 2010-04-29 LAB — POCT URINALYSIS DIP (DEVICE)
Nitrite: POSITIVE — AB
Urobilinogen, UA: 0.2 mg/dL (ref 0.0–1.0)
pH: 5.5 (ref 5.0–8.0)

## 2010-06-04 NOTE — Group Therapy Note (Signed)
NAME:  Mary Ramos, Mary Ramos NO.:  [id number]   MEDICAL RECORD NO.:  [id number]          PATIENT TYPE:  WOC   LOCATION:  [facility]                   FACILITY:  [facility]   PHYSICIAN:  Karlton Lemon, MD      DATE OF BIRTH:  [birth year]   DATE OF SERVICE:  04/02/2007                                  CLINIC NOTE   CHIEF COMPLAINT:  Follow-up from dilatation evacuation on 03/02/07.   HISTORY OF PRESENT ILLNESS:  This is a 40 year old gravida 6, para 2-0-4-  2 that had a menstrual abortion on 03/02/07 at [redacted] weeks gestation.  She  had D and A performed by Dr. Elsie Lincoln on 03/02/07.  She follows for  her postoperative examination.  She does complain of some fatigue and  vaginal discharge that will soak her panties.  She does now have vaginal  irritation or inflammation.  There is no odor with the discharge.  She  is also having some pain with intercourse. She states she is not having  any vaginal bleeding since her procedure. She did receive Depo-Provera  prior to discharge after D and A.   PAST MEDICAL HISTORY:  Complains of high cholesterol and arthritis.   OBSTETRICAL HISTORY:  The patient is a gravida 6, para 2-0-2-2.   GYNECOLOGIC HISTORY:  She has had a LEEP procedure for abnormal Pap  smears.  Severity of abnormality is unknown.   MEDICATIONS:  1. One a day vitamin, 1 tablet daily.  2. Tylenol as needed.   ALLERGIES:  No known drug allergies.   FAMILY HISTORY:  Noncontributory.   SOCIAL HISTORY:  The patient lives with her two kids and her mother. She  drinks socially and smokes half pack per day for the past 15 years.  She  denies drug abuse.   REVIEW OF SYSTEMS:  The patient's notes some frequent headaches, pain  with intercourse, vaginal itching and discharge. Remainder of review  systems is negative.   PHYSICAL EXAMINATION:  GENERAL:  This is a well-appearing female in no  distress.  VITALS:  Pulse 78, blood pressure 116/70, respirations 18, weight  140.9  pounds, height 5 feet 11 inches.  ABDOMEN:  Soft, nontender to palpation.  GENITOURINARY:  Normal external female genitalia.  Vaginal mucosa is  pink and moist.  There is a small amount of whitish slightly cottage  cheese appearing discharge.  Wet prep is collected.  Cervix appears  closed without any discharge or bleeding. On bimanual exam uterus is  slightly retroflexed and not enlarged.  Adnexa are difficult to palpate  but no masses felt.   ASSESSMENT/PLAN:  This is a 40 year old gravida 6, para 2-0-4-2 that is  postoperative from dilatation and evacuation for missed abortion.  She  is doing well today.  She was offered TSH for fatigue which she declines  today.  She has been instructed to follow with a primary care physician  if the fatigue persists.  Wet prep was performed today and will follow  the results and treat as needed. The patient is to follow-up as needed.  ______________________________  Karlton Lemon, MD     NS/MEDQ  D:  04/02/2007  T:  04/03/2007  Job:  [id number]

## 2010-06-04 NOTE — Op Note (Signed)
NAME:  Mary Ramos, Mary Ramos NO.:  [id number]   MEDICAL RECORD NO.:  [id number]          PATIENT TYPE:  AMB   LOCATION:  [facility]                           [facility]   PHYSICIAN:  Lesly Dukes, M.D. DATE OF BIRTH:  [birth year]   DATE OF PROCEDURE:  03/02/2007  DATE OF DISCHARGE:                               OPERATIVE REPORT   PREOPERATIVE DIAGNOSIS:  A 40 year old para 2-0-1-2 with seven week  missed abortion.   POSTOPERATIVE DIAGNOSIS:  A 40 year old para 2-0-1-2 with seven week  missed abortion.   PROCEDURE:  Dilatation and evacuation.   SURGEON:  Lesly Dukes, M.D.   ANESTHESIA:  MAC and local.   FINDINGS:  Slightly enlarged uterus preoperatively.   SPECIMENS:  Endometrial curettings to pathology.  Of note, there was  minimal tissue during the D&C.   ESTIMATED BLOOD LOSS:  Minimal.   COMPLICATIONS:  None.   PROCEDURE:  After informed consent was obtained, the patient was taken  to the operating room where MAC anesthesia was found to be adequate.  The patient was placed in a dorsal lithotomy position and prepared in a  normal sterile fashion.  The bladder was emptied with a Foley.  Exam  under anesthesia revealed an antegrade, slightly enlarged uterus.  A  bivalve speculum was placed into the patient's vagina, and the anterior  lip of the cervix was grasped with a single-tooth tenaculum, and 20 cc  of 1% lidocaine was injected at 3 and 9 o'clock.  The cervix was gently  dilated to a #9 Hagar with the half size Hagar dilators.  A #8 curved  suction curette was gently introduced into the uterus, and gentle  suction curettage was performed.  Again, minimal tissue was obtained.  The suction curette was removed, and a sharp curettage was performed,  and there was good cry on all four sides of the uterus.  A second pass  with the suction curette revealed again minimal tissue obtained.  All  instruments were removed from the patient's vagina, and there  was good  hemostasis from the cervix.  Patient tolerated the procedure well.  Sponge, lap, instrument, and needle count were correct x2.  Patient went  to the recovery room in stable condition.      Lesly Dukes, M.D.  Electronically Signed     KHL/MEDQ  D:  03/02/2007  T:  03/03/2007  Job:  [id number]

## 2010-06-04 NOTE — Group Therapy Note (Signed)
NAME:  Mary Ramos, Mary Ramos NO.:  [id number]   MEDICAL RECORD NO.:  [id number]          PATIENT TYPE:  WOC   LOCATION:  [facility]                   FACILITY:  [facility]   PHYSICIAN:  Caren Griffins, CNM       DATE OF BIRTH:  24-Mar-1970   DATE OF SERVICE:                                  CLINIC NOTE   REASON FOR VISIT:  Pelvic pressure and vaginal discharge.   HISTORY:  This is a 40 year old African American female who was recently  seen here in January 2010 for her Well Woman exam.  At that time, she  had negative wet prep, GC chlamydia, and Pap smear.  She also comes  every 3 months for her Depo.  She is very pleased with that method and  has amenorrhea.  She has a single partner which is the father of her 38-  year-old and her 14 year old.  She did use Monistat about a week ago  because she had slight vaginal itching.  She is amenorrheic due to Depo-  Provera.  She has a little bit of suprapubic discomfort with voiding.  No dyspareunia.  She has no other concerns.   VITAL SIGNS:  97.9, pulse is 54, BP 138/76, weight 144, height 5 feet  11.  ABDOMEN:  Soft, flat, nontender.  PELVIC:  NEFG.  BUS negative.  Vagina pink, rugated, clean.  Cervix  parous, no lesions.  There is physiologic-appearing white discharge.  Bimanual, uterus NSSP.  There is minimal tenderness suprapubically,  however, no CMT.  No adnexal masses or tenderness appreciated.   Wet prep is negative for trich, yeast, or BV.  Urinalysis is pending.   ASSESSMENT:  No evidence of vaginitis, rule out urinary tract infection.   PLAN:  Patient is reassured and is advised in vaginal hygiene.  She will  be called if there is any evidence of a urinary tract infection and  until then is advised to push fluids.  We will see her again for her  Depo shot in 1 month.           ______________________________  Caren Griffins, CNM     DP/MEDQ  D:  07/07/2008  T:  07/07/2008  Job:  [id number]

## 2010-06-04 NOTE — Group Therapy Note (Signed)
NAME:  Mary Ramos, Mary Ramos NO.:  [id number]   MEDICAL RECORD NO.:  [id number]          PATIENT TYPE:  WOC   LOCATION:  [facility]                   FACILITY:  [facility]   PHYSICIAN:  Sid Falcon, CNM  DATE OF BIRTH:  December 29, 1970   DATE OF SERVICE:                                  CLINIC NOTE   The patient is here for annual well-woman exam and Depo injection.   In review of medical history, the patient reports no changes in the past  year.   REVIEW OF SYSTEMS:  The patient indicates she has this right upper chest  pain, has been doing a lot of increased mapping at work, and feels it is  related to that, pain increases with movement and when she is eating in  her opinion too much food.  No shortness of breath, no diaphoresis with  the pain.  The patient denies taking any medications to alleviate the  pain.  Last menstrual period, the patient does not recall due to being  on Depo, last Depo injection was November 11, 2007, and was due on  February 10, 2008.   PHYSICAL EXAMINATION:  VITAL SIGNS:  Stable, 98.1 temperature, pulse 66,  blood pressure 110/66, and weight 147 pounds.  GENERAL:  The patient is alert and oriented x3.  No signs of acute  distress.  NECK:  No thyromegaly.  No nontender with palpation.  No dominant  masses.  CHEST:  Lungs clear to auscultation bilaterally.  CARDIOVASCULAR:  Regular rate and rhythm without murmurs, gallops, or  rubs.  BREAST:  Soft and nontender.  No dominant masses.  No retractions.  No  nipple discharge bilaterally.  ABDOMEN:  Positive bowel sounds x4.  No hepatosplenomegaly.  Nontender  with palpation and soft.  PELVIC:  Vagina, no abnormal lesions or abnormal discharge on the  external vaginal area.  Internal vagina, there was a white and adherent  discharge.  No odor noted.  Cervix, no abnormal discharge.  No abnormal  lesions.  Negative cervical motion tenderness.  Pap smear obtained  without difficulty.   ASSESSMENT:  1. Well-woman exam.  2. Depo injection.  3. Right shoulder pain.   PLAN:  Education informed the patient regarding that condoms is needed  for STD protection.  I reviewed breast self examination with the  patient.   PRESCRIPTIONS:  Ibuprofen 600 mg one p.o. q.6-8 hours as needed for  pain.  Depo injection today.   LABORATORY DATA:  Wet prep, Chlamydia, gonorrhea, Pap smear, follow up  in 3 months for Depo injection.  The patient was instructed that if the  shoulder and right chest pain does not improve with ibuprofen, then she  is to follow up with a primary care [place] [facility] or urgent care as  indicated.      Sid Falcon, CNM     WM/MEDQ  D:  02/11/2008  T:  02/11/2008  Job:  [id number]

## 2010-06-12 ENCOUNTER — Ambulatory Visit: Payer: Medicaid Other

## 2010-06-14 ENCOUNTER — Ambulatory Visit (INDEPENDENT_AMBULATORY_CARE_PROVIDER_SITE_OTHER): Payer: Medicaid Other

## 2010-06-14 DIAGNOSIS — Z3049 Encounter for surveillance of other contraceptives: Secondary | ICD-10-CM

## 2010-09-03 ENCOUNTER — Ambulatory Visit (INDEPENDENT_AMBULATORY_CARE_PROVIDER_SITE_OTHER): Payer: Medicaid Other

## 2010-09-03 VITALS — BP 128/77 | HR 69

## 2010-09-03 DIAGNOSIS — IMO0001 Reserved for inherently not codable concepts without codable children: Secondary | ICD-10-CM

## 2010-09-03 DIAGNOSIS — Z3049 Encounter for surveillance of other contraceptives: Secondary | ICD-10-CM

## 2010-09-03 MED ORDER — MEDROXYPROGESTERONE ACETATE 150 MG/ML IM SUSP
150.0000 mg | Freq: Once | INTRAMUSCULAR | Status: AC
  Administered 2010-09-03: 150 mg via INTRAMUSCULAR

## 2010-09-03 NOTE — Progress Notes (Signed)
Pt was informed to schedule an annual appt and the provider will evaluate her continuing on Depo Provera.

## 2010-09-06 ENCOUNTER — Emergency Department (HOSPITAL_COMMUNITY)
Admission: EM | Admit: 2010-09-06 | Discharge: 2010-09-07 | Disposition: A | Discharge status: Home / Self Care | Payer: Medicaid Other | Attending: Emergency Medicine | Admitting: Emergency Medicine

## 2010-09-06 ENCOUNTER — Emergency Department (HOSPITAL_COMMUNITY): Payer: Medicaid Other

## 2010-09-06 DIAGNOSIS — R000 Tachycardia, unspecified: Secondary | ICD-10-CM | POA: Insufficient documentation

## 2010-09-06 DIAGNOSIS — R509 Fever, unspecified: Secondary | ICD-10-CM | POA: Insufficient documentation

## 2010-09-06 DIAGNOSIS — J189 Pneumonia, unspecified organism: Secondary | ICD-10-CM | POA: Insufficient documentation

## 2010-09-06 IMAGING — CR DG CHEST 2V
2 series · 2 of 2 positions shown · non-contrast
Comparison: None.

CLINICAL DATA: Cough, congestion, shortness of breath and mid chest
pain.  History of smoking.

CHEST - 2 VIEW

[w chest pa]
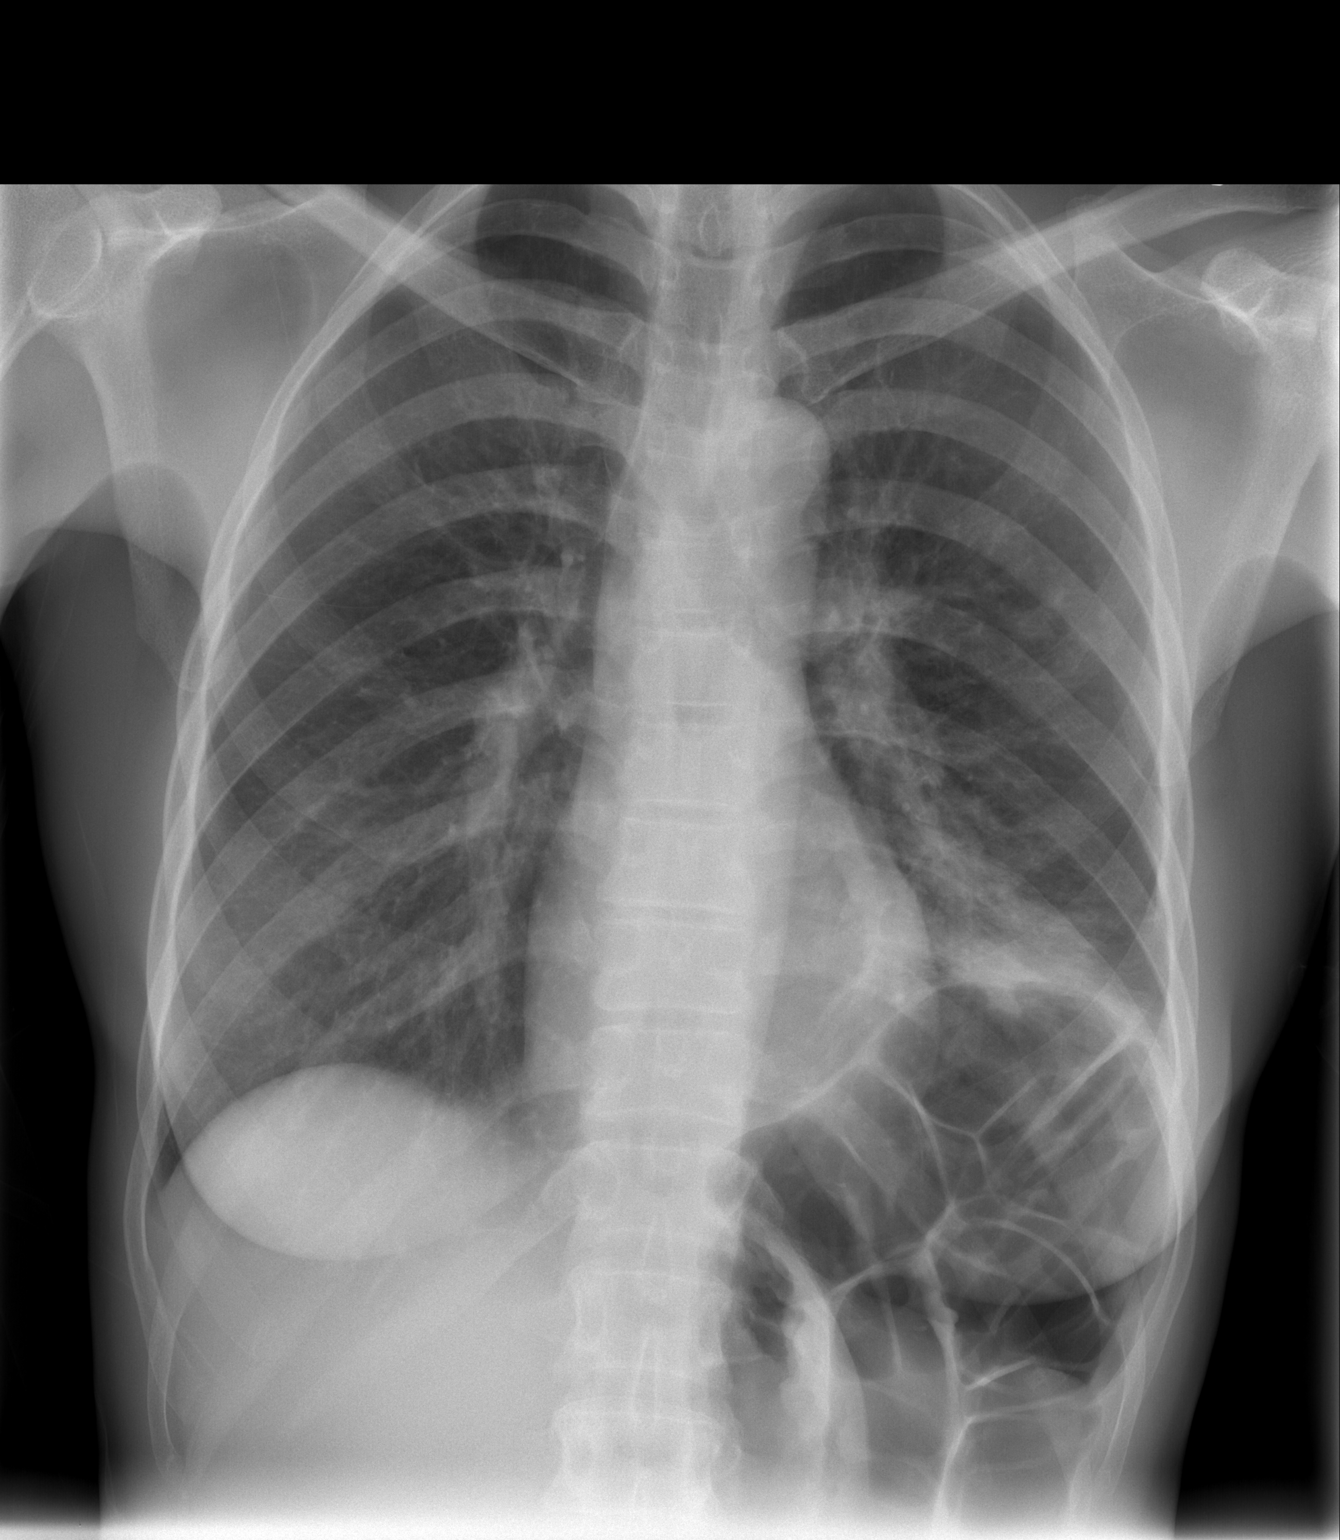

[w chest lat]
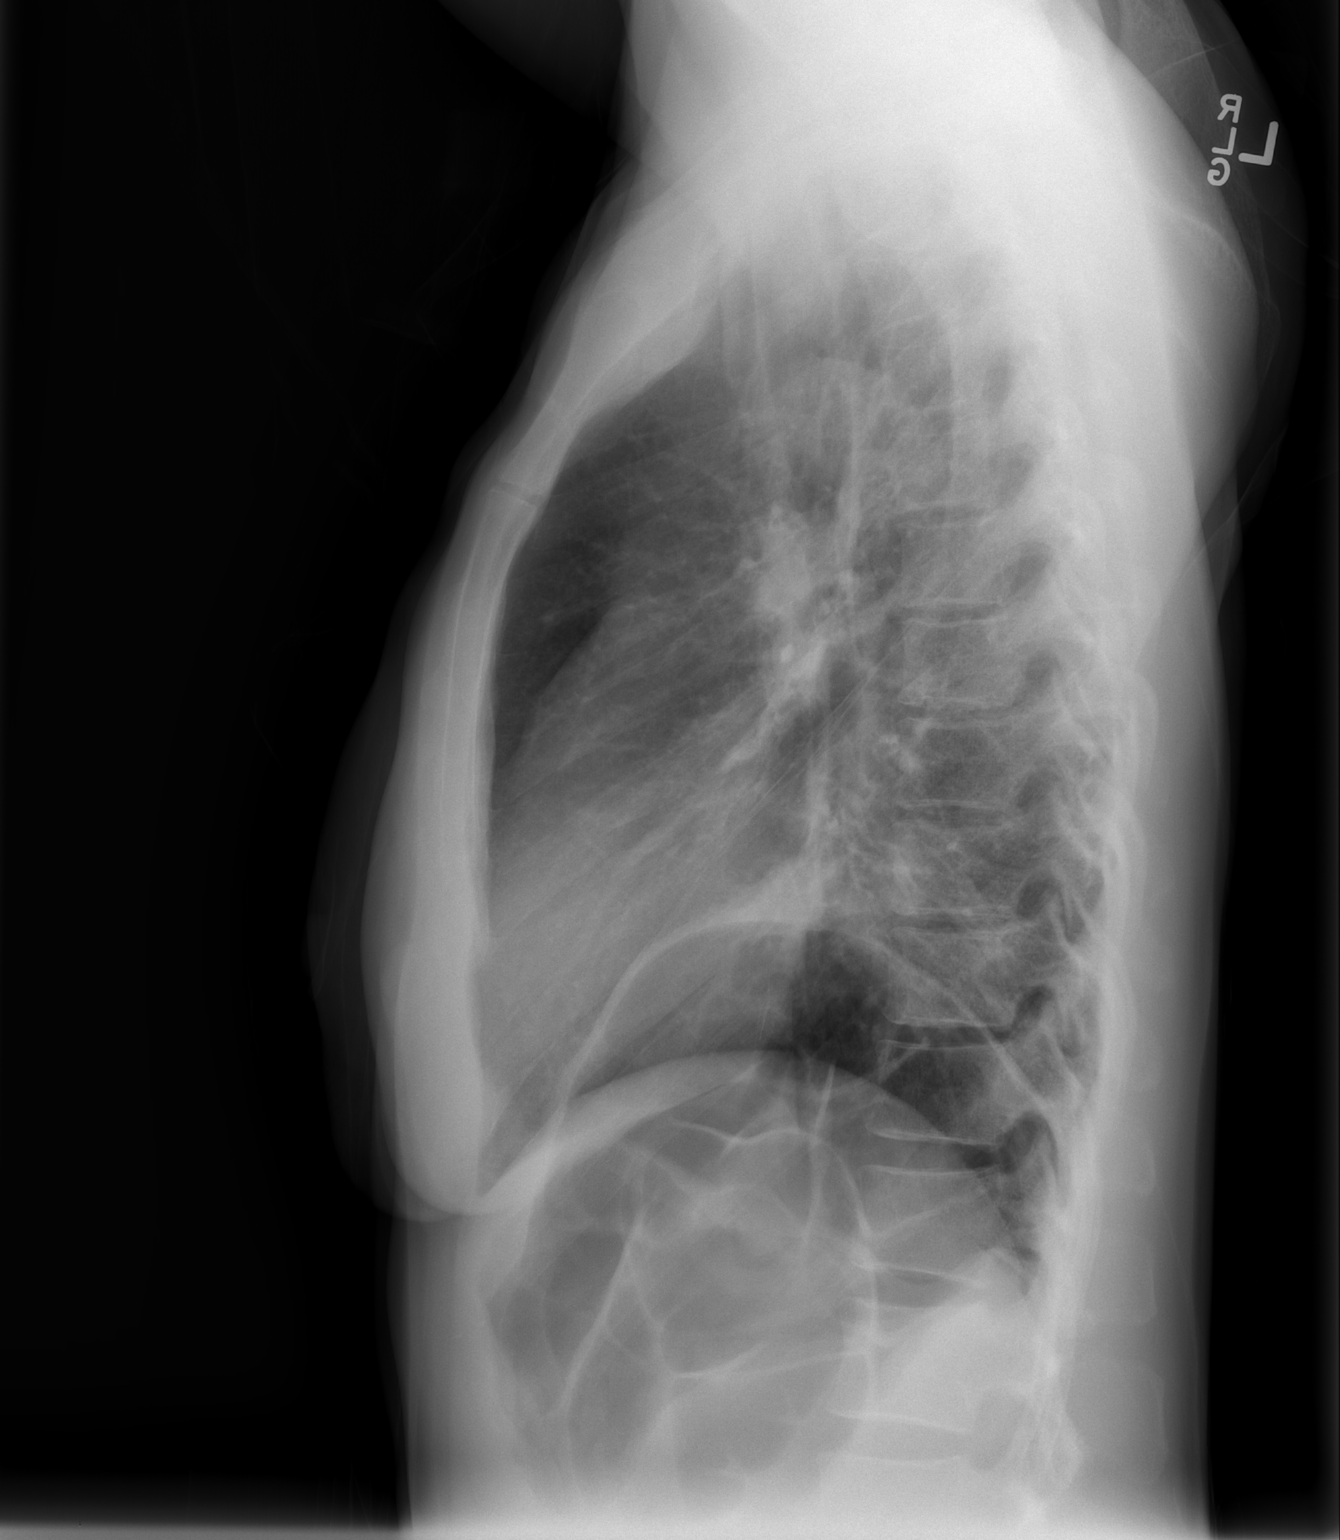

[2 of 2 positions shown; findings below may reference images not displayed]

FINDINGS: There is focal left lower lobe airspace opacification,
compatible with pneumonia.  Underlying atelectasis is noted.  The
right lung appears clear.  No pleural effusion or pneumothorax is
seen.

The heart is normal in size; the mediastinal contour is within
normal limits.  No acute osseous abnormalities are seen.
IMPRESSION: Left lower lobe pneumonia, with associated atelectasis.

## 2010-09-07 LAB — CBC
MCH: 30.6 pg (ref 26.0–34.0)
MCV: 87.1 fL (ref 78.0–100.0)
Platelets: 176 10*3/uL (ref 150–400)
RBC: 4.51 MIL/uL (ref 3.87–5.11)

## 2010-09-07 LAB — DIFFERENTIAL
Basophils Relative: 0 % (ref 0–1)
Eosinophils Absolute: 0 10*3/uL (ref 0.0–0.7)
Monocytes Absolute: 0.6 10*3/uL (ref 0.1–1.0)
Neutro Abs: 8.8 10*3/uL — ABNORMAL HIGH (ref 1.7–7.7)

## 2010-09-07 LAB — BASIC METABOLIC PANEL
BUN: 9 mg/dL (ref 6–23)
CO2: 20 mEq/L (ref 19–32)
Calcium: 9.5 mg/dL (ref 8.4–10.5)
Creatinine, Ser: 0.93 mg/dL (ref 0.50–1.10)

## 2010-10-11 LAB — I-STAT 8, (EC8 V) (CONVERTED LAB)
Bicarbonate: 24.5 — ABNORMAL HIGH
Glucose, Bld: 96
TCO2: 26
pH, Ven: 7.372 — ABNORMAL HIGH

## 2010-10-11 LAB — URINE MICROSCOPIC-ADD ON

## 2010-10-11 LAB — URINALYSIS, ROUTINE W REFLEX MICROSCOPIC
Bilirubin Urine: NEGATIVE
Ketones, ur: NEGATIVE
Specific Gravity, Urine: 1.023
Urobilinogen, UA: 1

## 2010-10-11 LAB — CBC
HCT: 35.4 — ABNORMAL LOW
HCT: 34.2 — ABNORMAL LOW
Hemoglobin: 11.6 — ABNORMAL LOW
MCHC: 34
MCHC: 33.8
MCV: 89.7
MCV: 89.5
Platelets: 252
RDW: 14.4
RDW: 14.4

## 2010-10-11 LAB — DIFFERENTIAL
Basophils Absolute: 0
Basophils Relative: 0
Eosinophils Absolute: 0.1
Eosinophils Relative: 2
Monocytes Absolute: 0.3
Monocytes Relative: 6

## 2010-10-11 LAB — GC/CHLAMYDIA PROBE AMP, GENITAL
Chlamydia, DNA Probe: NEGATIVE
GC Probe Amp, Genital: NEGATIVE

## 2010-10-11 LAB — RPR: RPR Ser Ql: NONREACTIVE

## 2010-10-11 LAB — HCG, QUANTITATIVE, PREGNANCY
hCG, Beta Chain, Quant, S: 3686 — ABNORMAL HIGH
hCG, Beta Chain, Quant, S: 4821 — ABNORMAL HIGH

## 2010-10-11 LAB — WET PREP, GENITAL

## 2010-10-11 LAB — TYPE AND SCREEN

## 2010-10-11 LAB — PREGNANCY, URINE: Preg Test, Ur: POSITIVE

## 2010-11-29 ENCOUNTER — Ambulatory Visit (INDEPENDENT_AMBULATORY_CARE_PROVIDER_SITE_OTHER): Payer: Medicaid Other | Admitting: *Deleted

## 2010-11-29 VITALS — BP 115/70 | HR 69

## 2010-11-29 DIAGNOSIS — Z3042 Encounter for surveillance of injectable contraceptive: Secondary | ICD-10-CM

## 2010-11-29 DIAGNOSIS — Z3049 Encounter for surveillance of other contraceptives: Secondary | ICD-10-CM

## 2010-11-29 MED ORDER — MEDROXYPROGESTERONE ACETATE 150 MG/ML IM SUSP
150.0000 mg | Freq: Once | INTRAMUSCULAR | Status: AC
  Administered 2010-11-29: 150 mg via INTRAMUSCULAR

## 2011-02-13 ENCOUNTER — Ambulatory Visit: Payer: Medicaid Other | Admitting: Obstetrics and Gynecology

## 2011-02-13 VITALS — BP 134/82 | HR 76 | Ht 71.0 in | Wt 157.6 lb

## 2011-02-13 DIAGNOSIS — Z23 Encounter for immunization: Secondary | ICD-10-CM

## 2011-02-13 DIAGNOSIS — Z3049 Encounter for surveillance of other contraceptives: Secondary | ICD-10-CM

## 2011-02-13 MED ORDER — MEDROXYPROGESTERONE ACETATE 150 MG/ML IM SUSP
150.0000 mg | Freq: Once | INTRAMUSCULAR | Status: AC
  Administered 2011-02-13: 150 mg via INTRAMUSCULAR

## 2011-02-14 ENCOUNTER — Ambulatory Visit: Payer: Medicaid Other

## 2011-03-18 ENCOUNTER — Telehealth: Payer: Self-pay | Admitting: *Deleted

## 2011-03-18 NOTE — Telephone Encounter (Signed)
Pt left message requesting a referral to [facility]. She states she has been feeling depressed. I returned call to pt and informed her that she can call [phone] for OP treatment [facility] and she does not need a referral. I asked if she had any feelings of wanting to harm herself or someone else. She said no, but she is just depressed. I also advised pt that she is past due for her annual Gyn exam @ our clinic. I offered to transfer her call for appt to be scheduled but she stated that she will just call back. Pt voiced understanding of all information given.

## 2011-05-01 ENCOUNTER — Ambulatory Visit: Payer: Medicaid Other

## 2011-05-01 ENCOUNTER — Ambulatory Visit (INDEPENDENT_AMBULATORY_CARE_PROVIDER_SITE_OTHER): Payer: Medicaid Other | Admitting: Obstetrics and Gynecology

## 2011-05-01 VITALS — BP 133/84 | HR 64 | Ht 71.0 in | Wt 162.4 lb

## 2011-05-01 DIAGNOSIS — Z3049 Encounter for surveillance of other contraceptives: Secondary | ICD-10-CM

## 2011-05-01 MED ORDER — MEDROXYPROGESTERONE ACETATE 150 MG/ML IM SUSP
150.0000 mg | INTRAMUSCULAR | Status: AC
  Administered 2011-05-01: 150 mg via INTRAMUSCULAR

## 2011-05-22 ENCOUNTER — Ambulatory Visit: Payer: Self-pay | Admitting: Family Medicine

## 2011-07-14 ENCOUNTER — Ambulatory Visit: Payer: Medicaid Other | Admitting: Advanced Practice Midwife

## 2011-07-17 ENCOUNTER — Ambulatory Visit (INDEPENDENT_AMBULATORY_CARE_PROVIDER_SITE_OTHER): Payer: Medicaid Other | Admitting: Obstetrics and Gynecology

## 2011-07-17 ENCOUNTER — Encounter: Payer: Self-pay | Admitting: Obstetrics and Gynecology

## 2011-07-17 VITALS — BP 106/67 | HR 69 | Temp 98.8°F | Ht 71.0 in | Wt 158.5 lb

## 2011-07-17 DIAGNOSIS — R319 Hematuria, unspecified: Secondary | ICD-10-CM

## 2011-07-17 DIAGNOSIS — B373 Candidiasis of vulva and vagina: Secondary | ICD-10-CM

## 2011-07-17 DIAGNOSIS — B3731 Acute candidiasis of vulva and vagina: Secondary | ICD-10-CM

## 2011-07-17 LAB — POCT URINALYSIS DIP (DEVICE)
Bilirubin Urine: NEGATIVE
Leukocytes, UA: NEGATIVE
Nitrite: NEGATIVE
Protein, ur: NEGATIVE mg/dL
pH: 5.5 (ref 5.0–8.0)

## 2011-07-17 MED ORDER — FLUCONAZOLE 150 MG PO TABS: 150.0000 mg | ORAL_TABLET | Freq: Once | ORAL | Status: AC

## 2011-07-17 NOTE — Patient Instructions (Signed)
Candida Infection, Adult A candida infection (also called yeast, fungus and Monilia infection) is an overgrowth of yeast that can occur anywhere on the body. A yeast infection commonly occurs in warm, moist body areas. Usually, the infection remains localized but can spread to become a systemic infection. A yeast infection may be a sign of a more severe disease such as diabetes, leukemia, or AIDS. A yeast infection can occur in both men and women. In women, Candida vaginitis is a vaginal infection. It is one of the most common causes of vaginitis. Men usually do not have symptoms or know they have an infection until other problems develop. Men may find out they have a yeast infection because their sex partner has a yeast infection. Uncircumcised men are more likely to get a yeast infection than circumcised men. This is because the uncircumcised glans is not exposed to air and does not remain as dry as that of a circumcised glans. Older adults may develop yeast infections around dentures. CAUSES  Women  Antibiotics.   Steroid medication taken for a long time.   Being overweight (obese).   Diabetes.   Poor immune condition.   Certain serious medical conditions.   Immune suppressive medications for organ transplant patients.   Chemotherapy.   Pregnancy.   Menstration.   Stress and fatigue.   Intravenous drug use.   Oral contraceptives.   Wearing tight-fitting clothes in the crotch area.   Catching it from a sex partner who has a yeast infection.   Spermicide.   Intravenous, urinary, or other catheters.  Men  Catching it from a sex partner who has a yeast infection.   Having oral or anal sex with a person who has the infection.   Spermicide.   Diabetes.   Antibiotics.   Poor immune system.   Medications that suppress the immune system.   Intravenous drug use.   Intravenous, urinary, or other catheters.  SYMPTOMS  Women  Thick, white vaginal discharge.    Vaginal itching.   Redness and swelling in and around the vagina.   Irritation of the lips of the vagina and perineum.   Blisters on the vaginal lips and perineum.   Painful sexual intercourse.   Low blood sugar (hypoglycemia).   Painful urination.   Bladder infections.   Intestinal problems such as constipation, indigestion, bad breath, bloating, increase in gas, diarrhea, or loose stools.  Men  Men may develop intestinal problems such as constipation, indigestion, bad breath, bloating, increase in gas, diarrhea, or loose stools.   Dry, cracked skin on the penis with itching or discomfort.   Jock itch.   Dry, flaky skin.   Athlete's foot.   Hypoglycemia.  DIAGNOSIS  Women  A history and an exam are performed.   The discharge may be examined under a microscope.   A culture may be taken of the discharge.  Men  A history and an exam are performed.   Any discharge from the penis or areas of cracked skin will be looked at under the microscope and cultured.   Stool samples may be cultured.  TREATMENT  Women  Vaginal antifungal suppositories and creams.   Medicated creams to decrease irritation and itching on the outside of the vagina.   Warm compresses to the perineal area to decrease swelling and discomfort.   Oral antifungal medications.   Medicated vaginal suppositories or cream for repeated or recurrent infections.   Wash and dry the irritation areas before applying the cream.     Eating yogurt with lactobacillus may help with prevention and treatment.   Sometimes painting the vagina with gentian violet solution may help if creams and suppositories do not work.  Men  Antifungal creams and oral antifungal medications.   Sometimes treatment must continue for 30 days after the symptoms go away to prevent recurrence.  HOME CARE INSTRUCTIONS  Women  Use cotton underwear and avoid tight-fitting clothing.   Avoid colored, scented toilet paper and  deodorant tampons or pads.   Do not douche.   Keep your diabetes under control.   Finish all the prescribed medications.   Keep your skin clean and dry.   Consume milk or yogurt with lactobacillus active culture regularly. If you get frequent yeast infections and think that is what the infection is, there are over-the-counter medications that you can get. If the infection does not show healing in 3 days, talk to your caregiver.   Tell your sex partner you have a yeast infection. Your partner may need treatment also, especially if your infection does not clear up or recurs.  Men  Keep your skin clean and dry.   Keep your diabetes under control.   Finish all prescribed medications.   Tell your sex partner that you have a yeast infection so they can be treated if necessary.  SEEK MEDICAL CARE IF:   Your symptoms do not clear up or worsen in one week after treatment.   You have an oral temperature above 102 F (38.9 C).   You have trouble swallowing or eating for a prolonged time.   You develop blisters on and around your vagina.   You develop vaginal bleeding and it is not your menstrual period.   You develop abdominal pain.   You develop intestinal problems as mentioned above.   You get weak or lightheaded.   You have painful or increased urination.   You have pain during sexual intercourse.  MAKE SURE YOU:   Understand these instructions.   Will watch your condition.   Will get help right away if you are not doing well or get worse.  Document Released: 02/14/2004 Document Revised: 12/26/2010 Document Reviewed: 05/28/2009 [employer] Patient Information 2012 [employer]. 

## 2011-07-17 NOTE — Progress Notes (Signed)
Mary Ramos [id number]  Chief Complaint  Patient presents with  . Vaginal Itching      SUBJECTIVE  HPI:  She presents with a one-day history of vulvar itching which she self treated with Monistat last night with some improvement. She has used a new soap recently. She has not been sexually active times several months but does want GC Chlamydia testing. She has been on Depo for years and has been amenorrheic however she noted some pink vaginal spotting yesterday and today. Denies dysuria, frequency or urgency of urination, gross hematuria.Last 3 Paps negative; last was 07/01/2010.  History reviewed. No pertinent past medical history. Past Surgical History  Procedure Date  . Leep    History   Social History  . Marital Status: Single    Spouse Name: N/A    Number of Children: N/A  . Years of Education: N/A   Occupational History  . Not on file.   Social History Main Topics  . Smoking status: Current Everyday Smoker -- 0.5 packs/day    Types: Cigarettes  . Smokeless tobacco: Never Used  . Alcohol Use: Not on file  . Drug Use: No  . Sexually Active: Not Currently    Birth Control/ Protection: Injection   Other Topics Concern  . Not on file   Social History Narrative  . No narrative on file   No current outpatient prescriptions on file prior to visit.   No Known Allergies  ROS: Pertinent items in HPI  OBJECTIVE Blood pressure 106/67, pulse 69, temperature 98.8 F (37.1 C), temperature source Oral, height 5\' 11"  (1.803 m), weight 158 lb 8 oz (71.895 kg).  GENERAL: Well-developed, well-nourished female in no acute distress.  HEENT: Normocephalic, good dentition HEART: normal rate RESP: normal effort ABDOMEN: Soft, nontender EXTREMITIES: Nontender, no edema NEURO: Alert and oriented SPECULUM EXAM: NEFG except mild erythema labium minora, scant brown blood noted, cervix clean BIMANUAL: cervix nontender; uterus NSSP; no adnexal tenderness or masses    LAB  RESULTS  Results for orders placed in visit on 07/17/11 (from the past 24 hour(s))  POCT URINALYSIS DIP (DEVICE)     Status: Abnormal   Collection Time   07/17/11  2:12 PM      Component Value Range   Glucose, UA NEGATIVE  NEGATIVE mg/dL   Bilirubin Urine NEGATIVE  NEGATIVE   Ketones, ur NEGATIVE  NEGATIVE mg/dL   Specific Gravity, Urine 1.025  1.005 - 1.030   Hgb urine dipstick MODERATE (*) NEGATIVE   pH 5.5  5.0 - 8.0   Protein, ur NEGATIVE  NEGATIVE mg/dL   Urobilinogen, UA 0.2  0.0 - 1.0 mg/dL   Nitrite NEGATIVE  NEGATIVE   Leukocytes, UA NEGATIVE  NEGATIVE    IMAGING   ASSESSMENT  Probable yeast vulvovaginitis Hematuria on dipstick probably due to vaginal spotting on Depo-Provera  PLAN  Rx Diflucan Wet prep sent; GC/CT sent Will check urine culture and repeat UA to followup hematuria if urine culture negative     Mary Ramos 07/17/2011 3:03 PM

## 2011-07-17 NOTE — Addendum Note (Signed)
Addended by: Danae Orleans on: 07/17/2011 03:48 PM   Modules accepted: Orders

## 2011-07-18 LAB — WET PREP, GENITAL

## 2011-07-29 ENCOUNTER — Other Ambulatory Visit (INDEPENDENT_AMBULATORY_CARE_PROVIDER_SITE_OTHER): Payer: Medicaid Other | Admitting: *Deleted

## 2011-07-29 VITALS — BP 128/83 | HR 62 | Temp 98.3°F | Ht 71.0 in | Wt 156.2 lb

## 2011-07-29 DIAGNOSIS — Z3049 Encounter for surveillance of other contraceptives: Secondary | ICD-10-CM

## 2011-07-29 MED ORDER — MEDROXYPROGESTERONE ACETATE 150 MG/ML IM SUSP
150.0000 mg | Freq: Once | INTRAMUSCULAR | Status: AC
  Administered 2011-07-29: 150 mg via INTRAMUSCULAR

## 2011-08-13 ENCOUNTER — Encounter (HOSPITAL_COMMUNITY): Payer: Self-pay | Admitting: *Deleted

## 2011-08-13 ENCOUNTER — Emergency Department (HOSPITAL_COMMUNITY): Payer: Medicaid Other

## 2011-08-13 ENCOUNTER — Emergency Department (HOSPITAL_COMMUNITY)
Admission: EM | Admit: 2011-08-13 | Discharge: 2011-08-13 | Disposition: A | Discharge status: Home / Self Care | Payer: Medicaid Other | Attending: Emergency Medicine | Admitting: Emergency Medicine

## 2011-08-13 DIAGNOSIS — S8991XA Unspecified injury of right lower leg, initial encounter: Secondary | ICD-10-CM

## 2011-08-13 DIAGNOSIS — F17200 Nicotine dependence, unspecified, uncomplicated: Secondary | ICD-10-CM | POA: Insufficient documentation

## 2011-08-13 DIAGNOSIS — M25469 Effusion, unspecified knee: Secondary | ICD-10-CM | POA: Insufficient documentation

## 2011-08-13 DIAGNOSIS — S8990XA Unspecified injury of unspecified lower leg, initial encounter: Secondary | ICD-10-CM | POA: Insufficient documentation
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
## 2011-08-13 MED ORDER — TRAMADOL HCL 50 MG PO TABS: 50.0000 mg | ORAL_TABLET | Freq: Four times a day (QID) | ORAL | Status: AC | PRN

## 2011-08-13 NOTE — ED Provider Notes (Signed)
Medical screening examination/treatment/procedure(s) were performed by non-physician practitioner and as supervising physician I was immediately available for consultation/collaboration.   Nat Christen, MD 08/13/11 [id number]
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
## 2011-10-13 ENCOUNTER — Ambulatory Visit: Payer: Medicaid Other

## 2011-10-15 ENCOUNTER — Ambulatory Visit (INDEPENDENT_AMBULATORY_CARE_PROVIDER_SITE_OTHER): Payer: Medicaid Other | Admitting: *Deleted

## 2011-10-15 VITALS — BP 128/81 | HR 95 | Temp 98.3°F | Ht 71.0 in | Wt 159.3 lb

## 2011-10-15 DIAGNOSIS — Z3049 Encounter for surveillance of other contraceptives: Secondary | ICD-10-CM

## 2011-10-15 MED ORDER — MEDROXYPROGESTERONE ACETATE 150 MG/ML IM SUSP
150.0000 mg | Freq: Once | INTRAMUSCULAR | Status: AC
  Administered 2011-10-15: 150 mg via INTRAMUSCULAR

## 2011-12-31 ENCOUNTER — Ambulatory Visit (INDEPENDENT_AMBULATORY_CARE_PROVIDER_SITE_OTHER): Payer: Medicaid Other | Admitting: General Practice

## 2011-12-31 VITALS — BP 131/83 | HR 77 | Temp 97.3°F | Ht 71.0 in | Wt 155.8 lb

## 2011-12-31 DIAGNOSIS — Z3049 Encounter for surveillance of other contraceptives: Secondary | ICD-10-CM

## 2011-12-31 MED ORDER — MEDROXYPROGESTERONE ACETATE 150 MG/ML IM SUSP
150.0000 mg | Freq: Once | INTRAMUSCULAR | Status: AC
  Administered 2011-12-31: 150 mg via INTRAMUSCULAR

## 2012-03-08 ENCOUNTER — Other Ambulatory Visit (HOSPITAL_COMMUNITY)
Admission: RE | Admit: 2012-03-08 | Discharge: 2012-03-08 | Disposition: A | Discharge status: Home / Self Care | Payer: Medicaid Other | Source: Ambulatory Visit | Attending: Obstetrics and Gynecology | Admitting: Obstetrics and Gynecology

## 2012-03-08 ENCOUNTER — Encounter: Payer: Self-pay | Admitting: Obstetrics and Gynecology

## 2012-03-08 ENCOUNTER — Ambulatory Visit (INDEPENDENT_AMBULATORY_CARE_PROVIDER_SITE_OTHER): Payer: Medicaid Other | Admitting: Obstetrics and Gynecology

## 2012-03-08 VITALS — BP 131/72 | HR 73 | Temp 97.2°F | Wt 158.2 lb

## 2012-03-08 DIAGNOSIS — N760 Acute vaginitis: Secondary | ICD-10-CM | POA: Insufficient documentation

## 2012-03-08 LAB — POCT URINALYSIS DIP (DEVICE)
Bilirubin Urine: NEGATIVE
Glucose, UA: NEGATIVE mg/dL
Specific Gravity, Urine: >=1.03 (ref 1.005–1.030)

## 2012-03-08 NOTE — Progress Notes (Signed)
Patient ID: Mary Ramos, female   DOB: 1970-01-29, 42 y.o.   MRN: [id number] 42 yo G3P2012 with depo-provera induced amenorrhea presenting today for evaluation of abnormal discharge. Patient reports discharge has been present for the past week. She describes it as yellow in color, without odor and some pruritis. Patient has not had intercourse since her last visit and is not concerned about an STD. She thinks it may be another yeast infection or allergic reaction to new toilet paper, soap or underwear she has been wearing.   Pelvic exam: Normal vaginal mucosa, white discharge, no odor  A/P 42 yo G3P2 with vaginitis - Wet prep collected - UA negative - Patient will be contacted with any abnormal results - perineal hygiene reviewed with patient - RTC for depo-provera and annual exam

## 2012-03-08 NOTE — Patient Instructions (Signed)
Vaginitis  Vaginitis is an infection. It causes soreness, swelling, and redness (inflammation) of the vagina. Many of these infections are sexually transmitted diseases (STDs). Having unprotected sex can cause further problems and complications such as:   Chronic pelvic pain.   Infertility.   Unwanted pregnancy.   Abortion.   Tubal pregnancy.   Infection passed on to the newborn.   Cancer.  CAUSES    Monilia. This is a yeast or fungus infection, not an STD.   Bacterial vaginosis. The normal balance of bacteria in the vagina is disrupted and is replaced by an overgrowth of certain bacteria.   Gonorrhea, chlamydia. These are bacterial infections that are STDs.   Vaginal sponges, diaphragms, and intrauterine devices.   Trichomoniasis. This is a STD infection caused by a parasite.   Viruses like herpes and human papillomavirus. Both are STDs.   Pregnancy.   Immunosuppression. This occurs with certain conditions such as HIV infection or cancer.   Using bubble bath.   Taking certain antibiotic medicines.   Sporadic recurrence can occur if you become sick.   Diabetes.   Steroids.   Allergic reaction. If you have an allergy to:   Douches.   Soaps.   Spermicides.   Condoms.   Scented tampons or vaginal sprays.  SYMPTOMS    Abnormal vaginal discharge.   Itching of the vagina.   Pain in the vagina.   Swelling of the vagina.  In some cases, there are no symptoms.  TREATMENT   Treatment will vary depending on the type of infection.   Bacteria or trichomonas are usually treated with oral antibiotics and sometimes vaginal cream or suppositories.   Monilia vaginitis is usually treated with vaginal creams, suppositories, or oral antifungal pills.   Viral vaginitis has no cure. However, the symptoms of herpes (a viral vaginitis) can be treated to relieve the discomfort. Human papillomavirus has no symptoms. However, there are treatments for the diseases caused by human papillomavirus.   With allergic  vaginitis, you need to stop using the product that is causing the problem. Vaginal creams can be used to treat the symptoms.   When treating an STD, the sex partner should also be treated.  HOME CARE INSTRUCTIONS    Take all the medicines as directed by your caregiver.   Do not use scented tampons, soaps, or vaginal sprays.   Do not douche.   Tell your sex partner if you have a vaginal infection or an STD.   Do not have sexual intercourse until you have treated the vaginitis.   Practice safe sex by using condoms.  SEEK MEDICAL CARE IF:    You have abdominal pain.   Your symptoms get worse during treatment.  Document Released: 11/03/2006 Document Revised: 03/31/2011 Document Reviewed: 06/29/2008  Exit[employer] Patient Information 2013 [employer].

## 2012-03-11 ENCOUNTER — Telehealth: Payer: Self-pay | Admitting: General Practice

## 2012-03-11 MED ORDER — METRONIDAZOLE 500 MG PO TABS: 500.0000 mg | ORAL_TABLET | Freq: Two times a day (BID) | ORAL | Status: DC

## 2012-03-11 NOTE — Addendum Note (Signed)
Addended by: Catalina Antigua on: 03/11/2012 11:03 AM   Modules accepted: Orders

## 2012-03-11 NOTE — Telephone Encounter (Signed)
Patient called back and I informed her of BV and that a medicine to treat it called Flagyl was called in to her [facility] on [address]. Patient verbalized understanding and had no further questions

## 2012-03-11 NOTE — Telephone Encounter (Signed)
Called patient and someone other than the patient answered the phone and said she wasn't there but could take a message. Told them to let the patient know that a nurse from the [facility] is trying to get in touch with the patient. They said they would relay the message

## 2012-03-11 NOTE — Telephone Encounter (Signed)
Message copied by Kathee Delton on Thu Mar 11, 2012 11:38 AM ------      Message from: CONSTANT, PEGGY      Created: Thu Mar 11, 2012 11:03 AM       Please inform patient of positive BV. Flagyl e-prescribed            Peggy ------

## 2012-03-17 ENCOUNTER — Ambulatory Visit (INDEPENDENT_AMBULATORY_CARE_PROVIDER_SITE_OTHER): Payer: Medicaid Other

## 2012-03-17 VITALS — BP 129/81 | HR 70 | Temp 97.1°F | Ht 71.0 in | Wt 158.6 lb

## 2012-03-17 DIAGNOSIS — Z3042 Encounter for surveillance of injectable contraceptive: Secondary | ICD-10-CM

## 2012-03-17 DIAGNOSIS — Z3049 Encounter for surveillance of other contraceptives: Secondary | ICD-10-CM

## 2012-03-17 MED ORDER — MEDROXYPROGESTERONE ACETATE 150 MG/ML IM SUSP
150.0000 mg | Freq: Once | INTRAMUSCULAR | Status: AC
  Administered 2012-03-17: 150 mg via INTRAMUSCULAR

## 2012-06-02 ENCOUNTER — Ambulatory Visit: Payer: Medicaid Other

## 2012-06-03 ENCOUNTER — Ambulatory Visit (INDEPENDENT_AMBULATORY_CARE_PROVIDER_SITE_OTHER): Payer: Medicaid Other | Admitting: *Deleted

## 2012-06-03 VITALS — BP 118/77 | HR 67 | Temp 98.7°F | Ht 71.0 in | Wt 156.8 lb

## 2012-06-03 DIAGNOSIS — Z3049 Encounter for surveillance of other contraceptives: Secondary | ICD-10-CM

## 2012-06-03 MED ORDER — MEDROXYPROGESTERONE ACETATE 150 MG/ML IM SUSP
150.0000 mg | Freq: Once | INTRAMUSCULAR | Status: AC
  Administered 2012-06-03: 150 mg via INTRAMUSCULAR

## 2012-08-19 ENCOUNTER — Ambulatory Visit (INDEPENDENT_AMBULATORY_CARE_PROVIDER_SITE_OTHER): Payer: Medicaid Other

## 2012-08-19 VITALS — BP 137/76 | HR 86 | Wt 149.4 lb

## 2012-08-19 DIAGNOSIS — Z3049 Encounter for surveillance of other contraceptives: Secondary | ICD-10-CM

## 2012-08-19 DIAGNOSIS — O09219 Supervision of pregnancy with history of pre-term labor, unspecified trimester: Secondary | ICD-10-CM

## 2012-08-19 MED ORDER — MEDROXYPROGESTERONE ACETATE 150 MG/ML IM SUSP
150.0000 mg | Freq: Once | INTRAMUSCULAR | Status: AC
  Administered 2012-08-19: 150 mg via INTRAMUSCULAR

## 2012-08-20 ENCOUNTER — Ambulatory Visit: Payer: Medicaid Other

## 2012-09-10 ENCOUNTER — Encounter (HOSPITAL_COMMUNITY): Payer: Self-pay | Admitting: Emergency Medicine

## 2012-09-10 ENCOUNTER — Emergency Department (HOSPITAL_COMMUNITY)
Admission: EM | Admit: 2012-09-10 | Discharge: 2012-09-10 | Disposition: A | Discharge status: Home / Self Care | Payer: Medicaid Other | Attending: Emergency Medicine | Admitting: Emergency Medicine

## 2012-09-10 ENCOUNTER — Emergency Department (HOSPITAL_COMMUNITY): Payer: Medicaid Other

## 2012-09-10 DIAGNOSIS — R3915 Urgency of urination: Secondary | ICD-10-CM | POA: Insufficient documentation

## 2012-09-10 DIAGNOSIS — F17200 Nicotine dependence, unspecified, uncomplicated: Secondary | ICD-10-CM | POA: Insufficient documentation

## 2012-09-10 DIAGNOSIS — B9689 Other specified bacterial agents as the cause of diseases classified elsewhere: Secondary | ICD-10-CM

## 2012-09-10 DIAGNOSIS — Z87440 Personal history of urinary (tract) infections: Secondary | ICD-10-CM | POA: Insufficient documentation

## 2012-09-10 DIAGNOSIS — Z3202 Encounter for pregnancy test, result negative: Secondary | ICD-10-CM | POA: Insufficient documentation

## 2012-09-10 DIAGNOSIS — R109 Unspecified abdominal pain: Secondary | ICD-10-CM

## 2012-09-10 DIAGNOSIS — N200 Calculus of kidney: Secondary | ICD-10-CM

## 2012-09-10 DIAGNOSIS — R350 Frequency of micturition: Secondary | ICD-10-CM | POA: Insufficient documentation

## 2012-09-10 DIAGNOSIS — N760 Acute vaginitis: Secondary | ICD-10-CM

## 2012-09-10 DIAGNOSIS — R10A2 Flank pain, left side: Secondary | ICD-10-CM

## 2012-09-10 DIAGNOSIS — R61 Generalized hyperhidrosis: Secondary | ICD-10-CM | POA: Insufficient documentation

## 2012-09-10 DIAGNOSIS — R110 Nausea: Secondary | ICD-10-CM | POA: Insufficient documentation

## 2012-09-10 DIAGNOSIS — R209 Unspecified disturbances of skin sensation: Secondary | ICD-10-CM | POA: Insufficient documentation

## 2012-09-10 LAB — WET PREP, GENITAL: Trich, Wet Prep: NONE SEEN

## 2012-09-10 LAB — URINALYSIS W MICROSCOPIC + REFLEX CULTURE
Bilirubin Urine: NEGATIVE
Ketones, ur: NEGATIVE mg/dL
Leukocytes, UA: NEGATIVE
Nitrite: NEGATIVE
Protein, ur: NEGATIVE mg/dL
Urobilinogen, UA: 1 mg/dL (ref 0.0–1.0)
pH: 6 (ref 5.0–8.0)

## 2012-09-10 IMAGING — CT CT ABD-PELV W/O CM
1 series · 14 of 17 positions shown, 19 images · non-contrast
Comparison: None.

CLINICAL DATA: Left-sided abdominal pain.  Urinary frequency.
Painful urination.

CT ABDOMEN AND PELVIS WITHOUT CONTRAST
TECHNIQUE: Multidetector CT imaging of the abdomen and pelvis was
performed following the standard protocol without intravenous
contrast.

[Series 6: lung · axial · 0.64mm/px · z∈[+1344,+1414]mm · 14 of 17 slices shown, 19 images]
[im 2/17  soft-tissue]
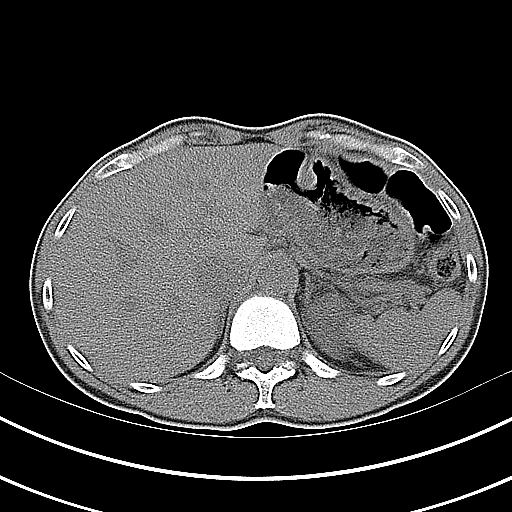
[im 2/17  bone]
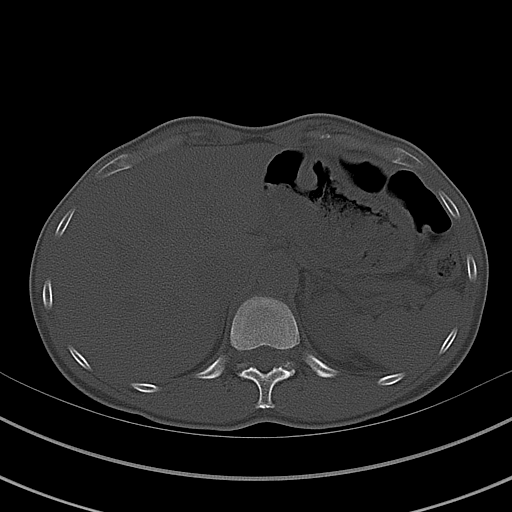
[im 3/17  soft-tissue]
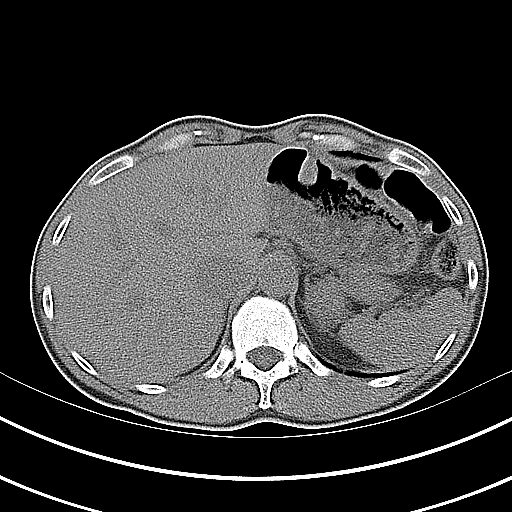
[im 4/17  soft-tissue]
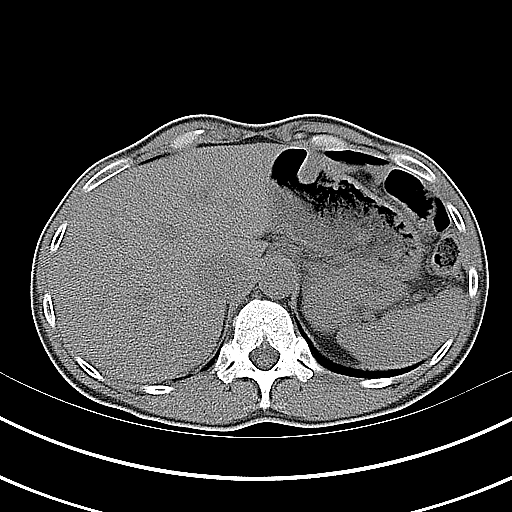
[im 6/17  soft-tissue]
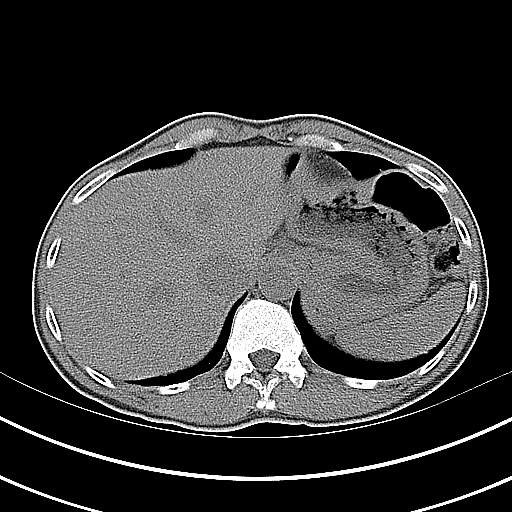
[im 7/17  soft-tissue]
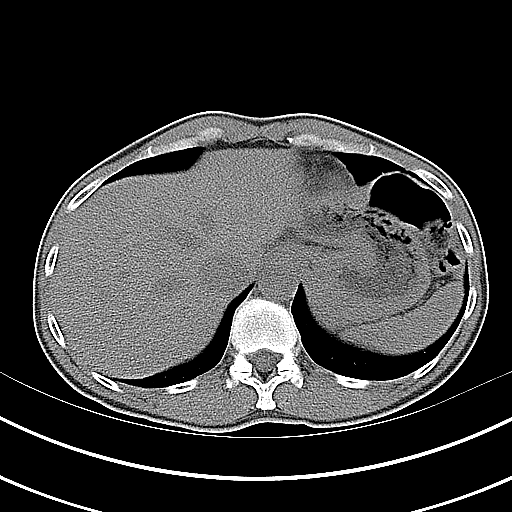
[im 8/17  soft-tissue]
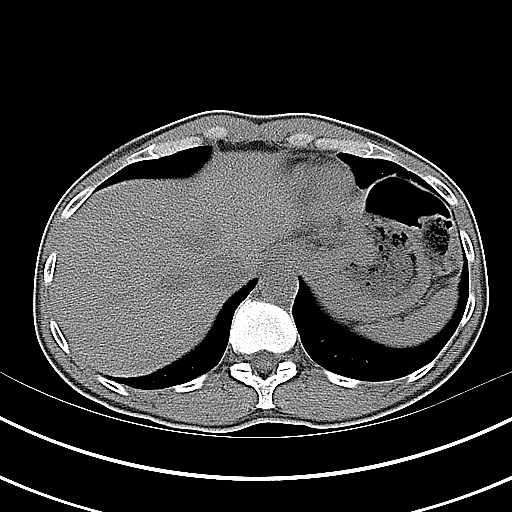
[im 9/17  soft-tissue]
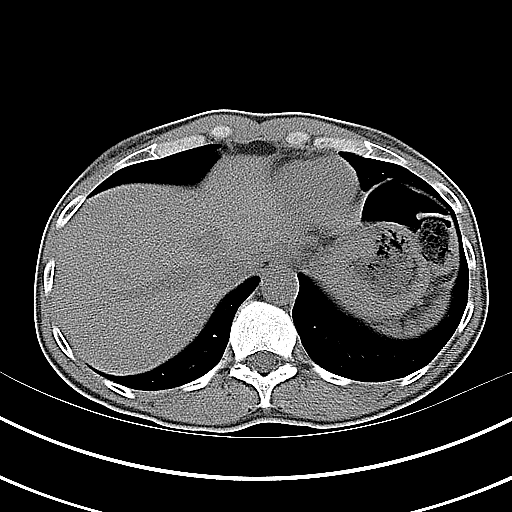
[im 10/17  soft-tissue]
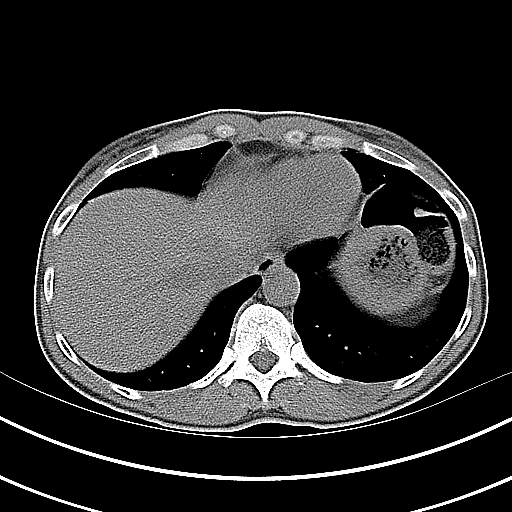
[im 11/17  soft-tissue]
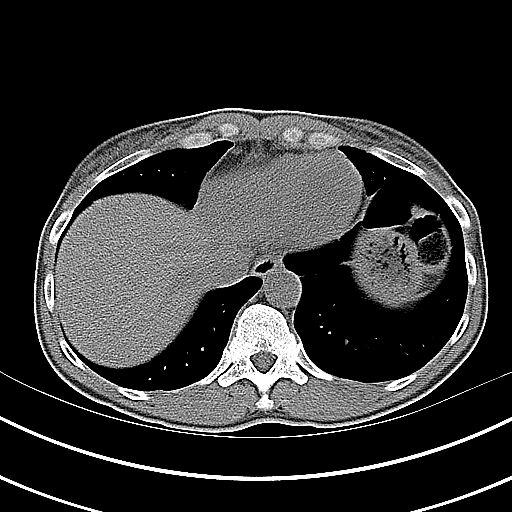
[im 11/17  bone]
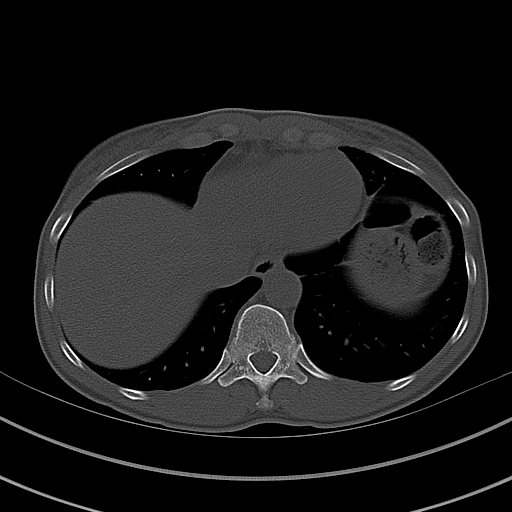
[im 12/17  soft-tissue]
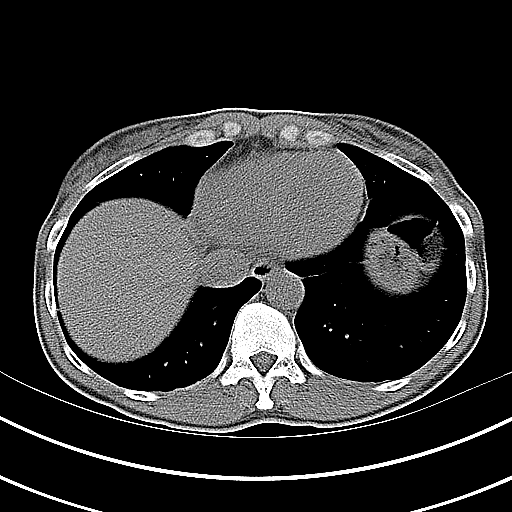
[im 13/17  lung]
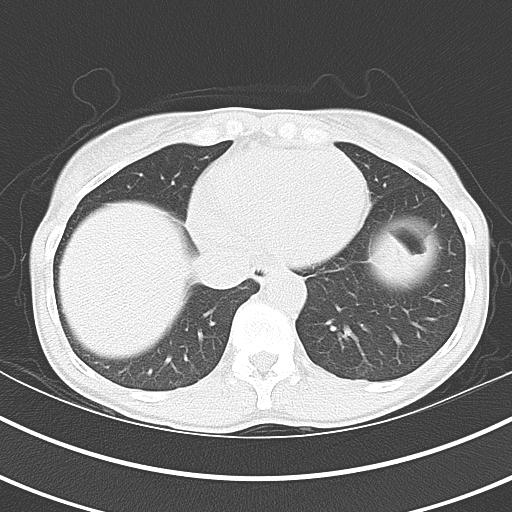
[im 14/17  soft-tissue]
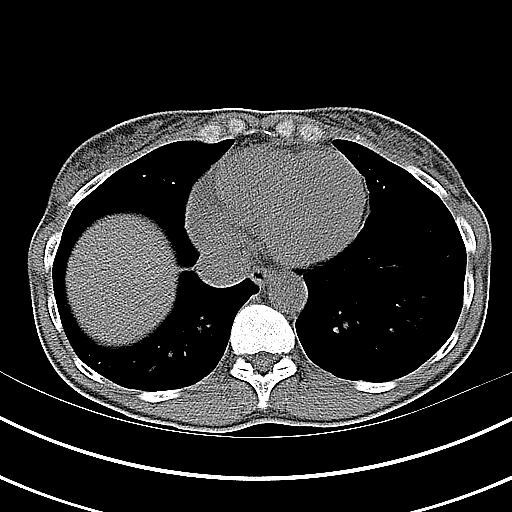
[im 14/17  lung]
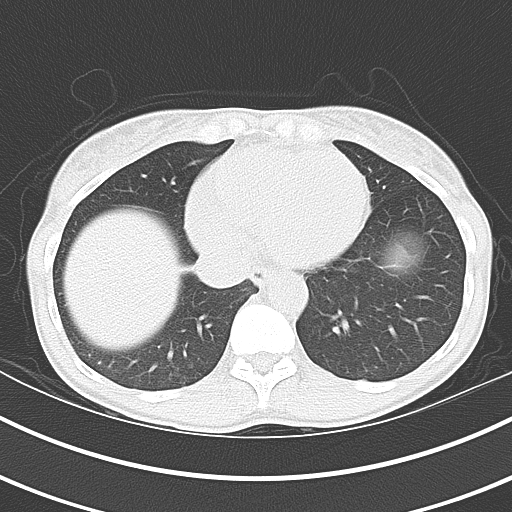
[im 15/17  soft-tissue]
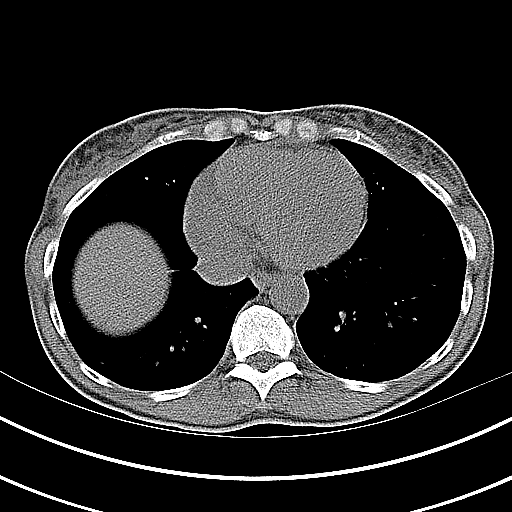
[im 15/17  lung]
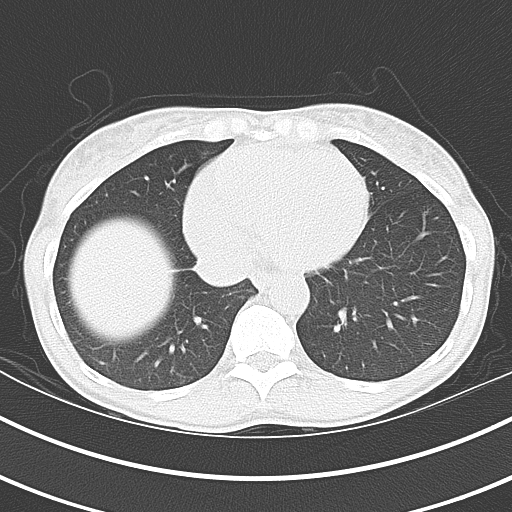
[im 16/17  soft-tissue]
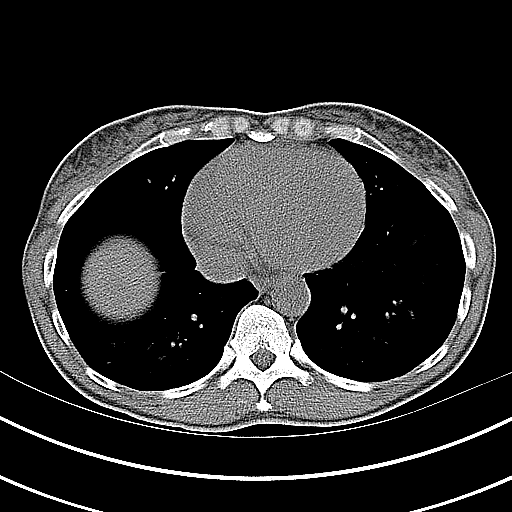
[im 16/17  lung]
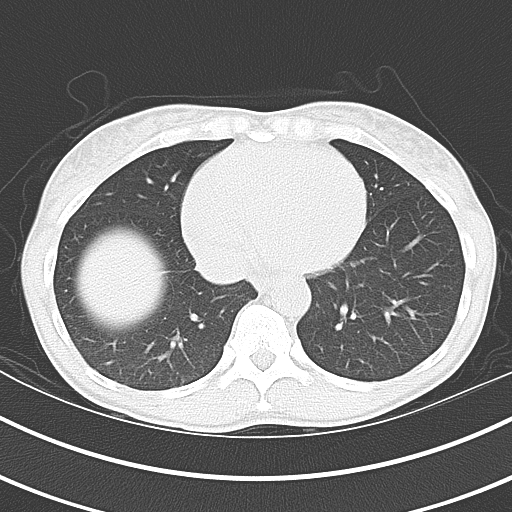

[14 of 17 positions shown; findings below may reference images not displayed]

FINDINGS: Lung bases are clear.  No pleural or pericardial fluid.

The liver has a normal appearance without focal lesions or biliary
ductal dilatation.  The spleen is normal.  The pancreas appears
normal without contrast.  The adrenal glands are normal.  The right
kidney contains a 2 mm stone in the lower pole and shows normal
size measuring 11.2 cm in length.  Left kidney does not contain any
stones.  No cyst or mass.  The kidney measures 12.1 cm in length.
There is mild fullness of the renal collecting system but there is
no evidence of ureteral stone or stone in the bladder.  Multiple
phleboliths present in the pelvis.  The uterus and adnexal regions
are unremarkable.  The aorta shows mild atherosclerosis but no
aneurysm.  No free fluid or air.  No bowel pathology seen.
IMPRESSION: 2 mm stone in the lower pole the right kidney, presumably
incidental.

Mild fullness of the left renal collecting system but no evidence
of passing stone or stone in the bladder.  Multiple phleboliths in
the pelvis.

## 2012-09-10 MED ORDER — METRONIDAZOLE 500 MG PO TABS: 500.0000 mg | ORAL_TABLET | Freq: Two times a day (BID) | ORAL | Status: DC

## 2012-09-10 NOTE — ED Notes (Signed)
Pelvic Cart set up 
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
## 2012-09-10 NOTE — ED Notes (Signed)
Pt c/o of urinary frequency and hurting after urinating. Also c/o numbness in right arm.

## 2012-09-11 LAB — GC/CHLAMYDIA PROBE AMP: CT Probe RNA: NEGATIVE

## 2012-09-13 NOTE — ED Provider Notes (Signed)
Medical screening examination/treatment/procedure(s) were performed by non-physician practitioner and as supervising physician I was immediately available for consultation/collaboration.   Johnni Wunschel M Neil Brickell, DO 09/13/12 0712 

## 2012-10-29 ENCOUNTER — Encounter: Payer: Medicaid Other | Admitting: Obstetrics & Gynecology

## 2012-11-05 ENCOUNTER — Ambulatory Visit (INDEPENDENT_AMBULATORY_CARE_PROVIDER_SITE_OTHER): Payer: Medicaid Other

## 2012-11-05 VITALS — BP 131/80 | HR 71 | Temp 98.4°F | Wt 148.4 lb

## 2012-11-05 DIAGNOSIS — Z3049 Encounter for surveillance of other contraceptives: Secondary | ICD-10-CM

## 2012-11-05 MED ORDER — MEDROXYPROGESTERONE ACETATE 104 MG/0.65ML ~~LOC~~ SUSP
104.0000 mg | Freq: Once | SUBCUTANEOUS | Status: AC
  Administered 2012-11-05: 104 mg via SUBCUTANEOUS

## 2012-11-17 ENCOUNTER — Encounter: Payer: Medicaid Other | Admitting: Obstetrics & Gynecology

## 2013-01-20 DIAGNOSIS — Z8742 Personal history of other diseases of the female genital tract: Secondary | ICD-10-CM

## 2013-01-20 HISTORY — DX: Personal history of other diseases of the female genital tract: Z87.42

## 2013-01-27 ENCOUNTER — Ambulatory Visit: Payer: Medicaid Other

## 2013-01-31 ENCOUNTER — Ambulatory Visit: Payer: Medicaid Other

## 2013-02-24 ENCOUNTER — Ambulatory Visit (INDEPENDENT_AMBULATORY_CARE_PROVIDER_SITE_OTHER): Payer: Medicaid Other

## 2013-02-24 VITALS — BP 132/78 | HR 62 | Temp 98.5°F | Ht 71.0 in | Wt 152.6 lb

## 2013-02-24 DIAGNOSIS — Z01812 Encounter for preprocedural laboratory examination: Secondary | ICD-10-CM

## 2013-02-24 DIAGNOSIS — Z3049 Encounter for surveillance of other contraceptives: Secondary | ICD-10-CM

## 2013-02-24 LAB — POCT PREGNANCY, URINE: Preg Test, Ur: NEGATIVE

## 2013-02-24 MED ORDER — MEDROXYPROGESTERONE ACETATE 104 MG/0.65ML ~~LOC~~ SUSP
104.0000 mg | Freq: Once | SUBCUTANEOUS | Status: AC
Start: 2013-02-24 — End: 2013-02-24
  Administered 2013-02-24: 104 mg via SUBCUTANEOUS

## 2013-02-24 NOTE — Progress Notes (Signed)
Pt. Here today for depo provera. Last dose was 11/05/12. 16 weeks has elapsed. Per protocol, pt. Asked if she has had unprotected sex. Pt. States she has not had sex in over a month. Urine pregnancy test obtained and negative. Dose given. Pt. Informed that if she is to have sex in the next to weeks to use other form of protection. Pt. Verbalized understanding.

## 2013-03-11 ENCOUNTER — Ambulatory Visit: Payer: Medicaid Other | Admitting: Nurse Practitioner

## 2013-04-07 ENCOUNTER — Telehealth: Payer: Self-pay | Admitting: *Deleted

## 2013-04-07 ENCOUNTER — Ambulatory Visit: Payer: Medicaid Other | Admitting: Obstetrics and Gynecology

## 2013-04-07 NOTE — Telephone Encounter (Signed)
Called patient and left voicemail informing patient that she missed her appt today and to please call to reschedule.

## 2013-05-18 ENCOUNTER — Ambulatory Visit (INDEPENDENT_AMBULATORY_CARE_PROVIDER_SITE_OTHER): Payer: Medicaid Other | Admitting: *Deleted

## 2013-05-18 DIAGNOSIS — Z3049 Encounter for surveillance of other contraceptives: Secondary | ICD-10-CM | POA: Diagnosis not present

## 2013-05-18 LAB — POCT PREGNANCY, URINE: PREG TEST UR: NEGATIVE

## 2013-05-18 MED ORDER — MEDROXYPROGESTERONE ACETATE 104 MG/0.65ML ~~LOC~~ SUSP
104.0000 mg | Freq: Once | SUBCUTANEOUS | Status: AC
  Administered 2013-05-18: 104 mg via SUBCUTANEOUS
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
## 2013-08-09 ENCOUNTER — Emergency Department (HOSPITAL_COMMUNITY)
Admission: EM | Admit: 2013-08-09 | Discharge: 2013-08-09 | Disposition: A | Discharge status: Home / Self Care | Payer: Medicaid Other | Attending: Emergency Medicine | Admitting: Emergency Medicine

## 2013-08-09 ENCOUNTER — Emergency Department (HOSPITAL_COMMUNITY): Payer: Medicaid Other

## 2013-08-09 ENCOUNTER — Encounter (HOSPITAL_COMMUNITY): Payer: Self-pay | Admitting: Emergency Medicine

## 2013-08-09 DIAGNOSIS — F17200 Nicotine dependence, unspecified, uncomplicated: Secondary | ICD-10-CM | POA: Insufficient documentation

## 2013-08-09 DIAGNOSIS — Z792 Long term (current) use of antibiotics: Secondary | ICD-10-CM | POA: Diagnosis not present

## 2013-08-09 DIAGNOSIS — R079 Chest pain, unspecified: Secondary | ICD-10-CM | POA: Diagnosis present

## 2013-08-09 DIAGNOSIS — R059 Cough, unspecified: Secondary | ICD-10-CM | POA: Insufficient documentation

## 2013-08-09 DIAGNOSIS — R05 Cough: Secondary | ICD-10-CM | POA: Diagnosis not present

## 2013-08-09 DIAGNOSIS — R0789 Other chest pain: Secondary | ICD-10-CM | POA: Diagnosis not present

## 2013-08-09 DIAGNOSIS — L259 Unspecified contact dermatitis, unspecified cause: Secondary | ICD-10-CM | POA: Diagnosis not present

## 2013-08-09 LAB — CBC WITH DIFFERENTIAL/PLATELET
BASOS PCT: 0 % (ref 0–1)
Basophils Absolute: 0 10*3/uL (ref 0.0–0.1)
Eosinophils Absolute: 0.2 10*3/uL (ref 0.0–0.7)
Eosinophils Relative: 3 % (ref 0–5)
HCT: 38 % (ref 36.0–46.0)
HEMOGLOBIN: 12.8 g/dL (ref 12.0–15.0)
Lymphocytes Relative: 45 % (ref 12–46)
Lymphs Abs: 2.3 10*3/uL (ref 0.7–4.0)
MCH: 30.1 pg (ref 26.0–34.0)
MCHC: 33.7 g/dL (ref 30.0–36.0)
MCV: 89.4 fL (ref 78.0–100.0)
MONOS PCT: 9 % (ref 3–12)
Monocytes Absolute: 0.5 10*3/uL (ref 0.1–1.0)
NEUTROS ABS: 2.2 10*3/uL (ref 1.7–7.7)
NEUTROS PCT: 43 % (ref 43–77)
PLATELETS: 224 10*3/uL (ref 150–400)
RBC: 4.25 MIL/uL (ref 3.87–5.11)
RDW: 14 % (ref 11.5–15.5)
WBC: 5.2 10*3/uL (ref 4.0–10.5)

## 2013-08-09 LAB — BASIC METABOLIC PANEL
ANION GAP: 11 (ref 5–15)
BUN: 13 mg/dL (ref 6–23)
CHLORIDE: 105 meq/L (ref 96–112)
CO2: 24 mEq/L (ref 19–32)
Calcium: 9.5 mg/dL (ref 8.4–10.5)
Creatinine, Ser: 0.76 mg/dL (ref 0.50–1.10)
Glucose, Bld: 115 mg/dL — ABNORMAL HIGH (ref 70–99)
POTASSIUM: 3.6 meq/L — AB (ref 3.7–5.3)
SODIUM: 140 meq/L (ref 137–147)

## 2013-08-09 LAB — I-STAT TROPONIN, ED: TROPONIN I, POC: 0 ng/mL (ref 0.00–0.08)

## 2013-08-09 IMAGING — CR DG CHEST 2V
2 series · 2 of 2 positions shown · non-contrast
Comparison: PA and lateral chest x-ray [DATE]

CLINICAL DATA: Forty-five days of chest pain and fatigue with
extremity swelling; history of tobacco use

EXAM:
CHEST  2 VIEW

[view not recorded (1 of 2)]
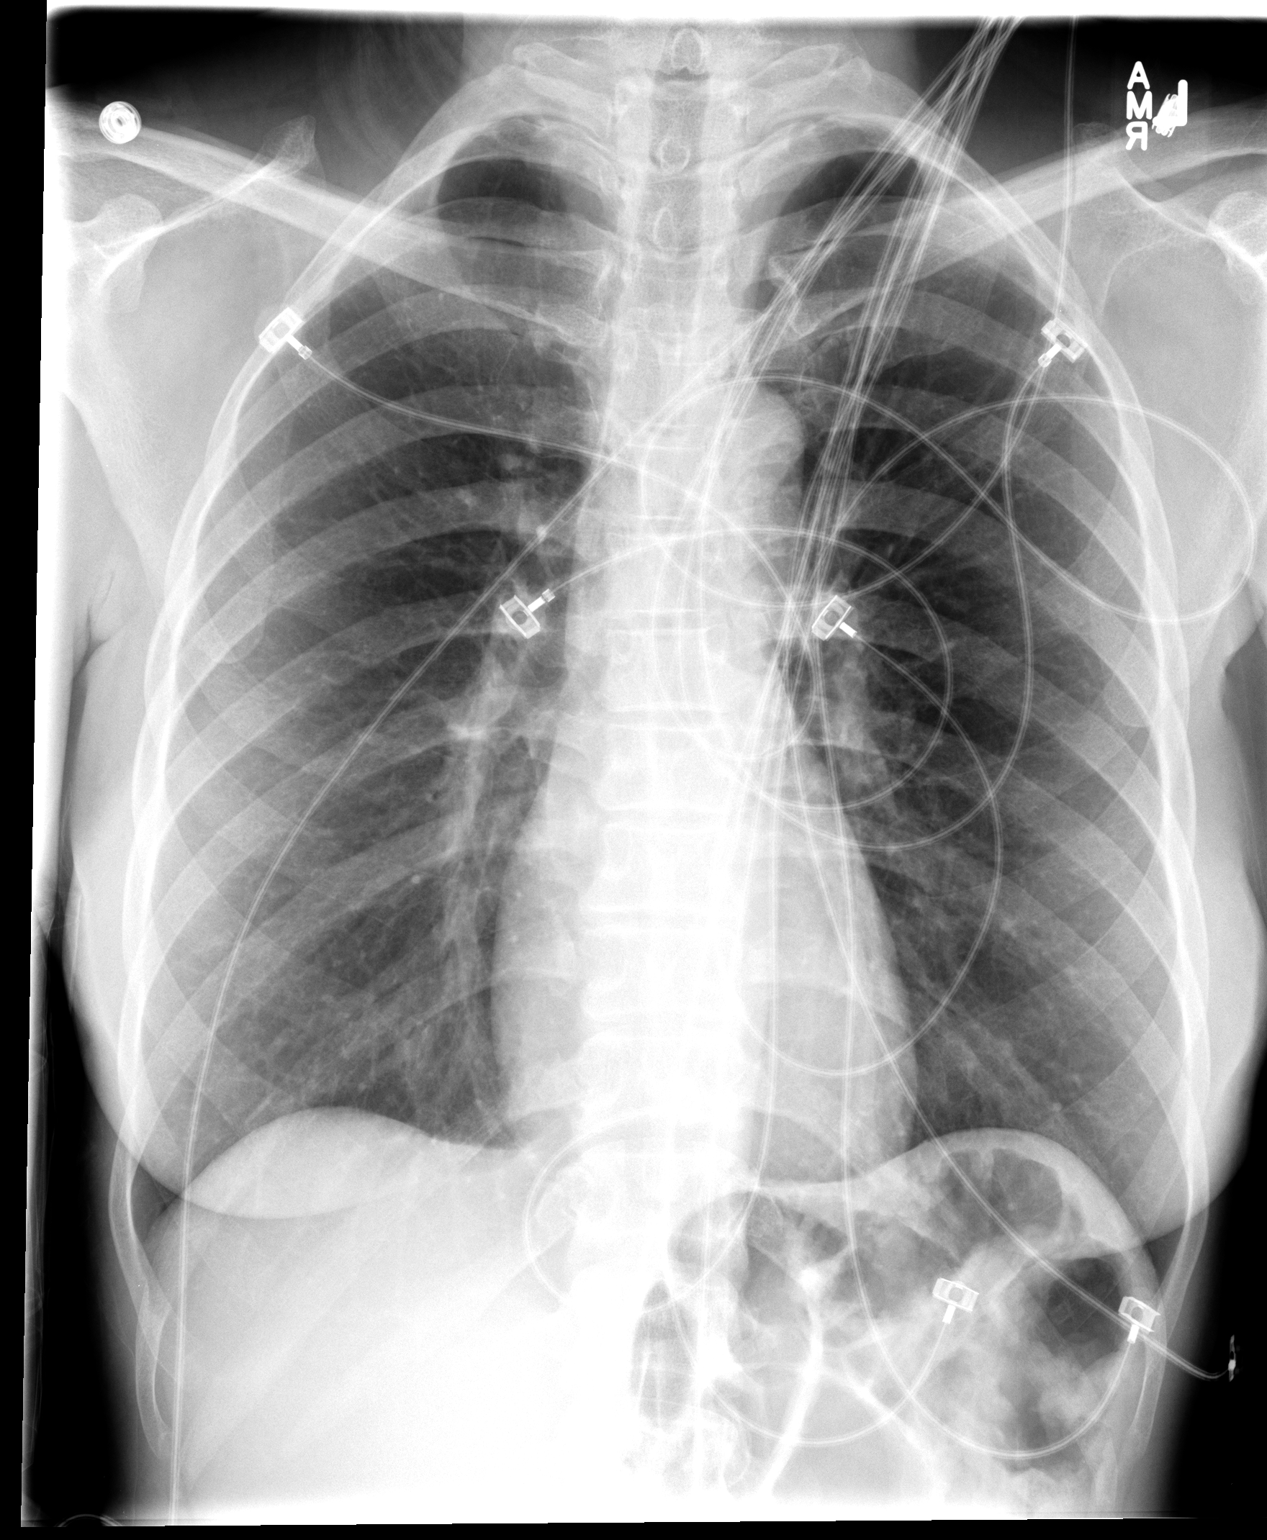

[view not recorded (2 of 2)]
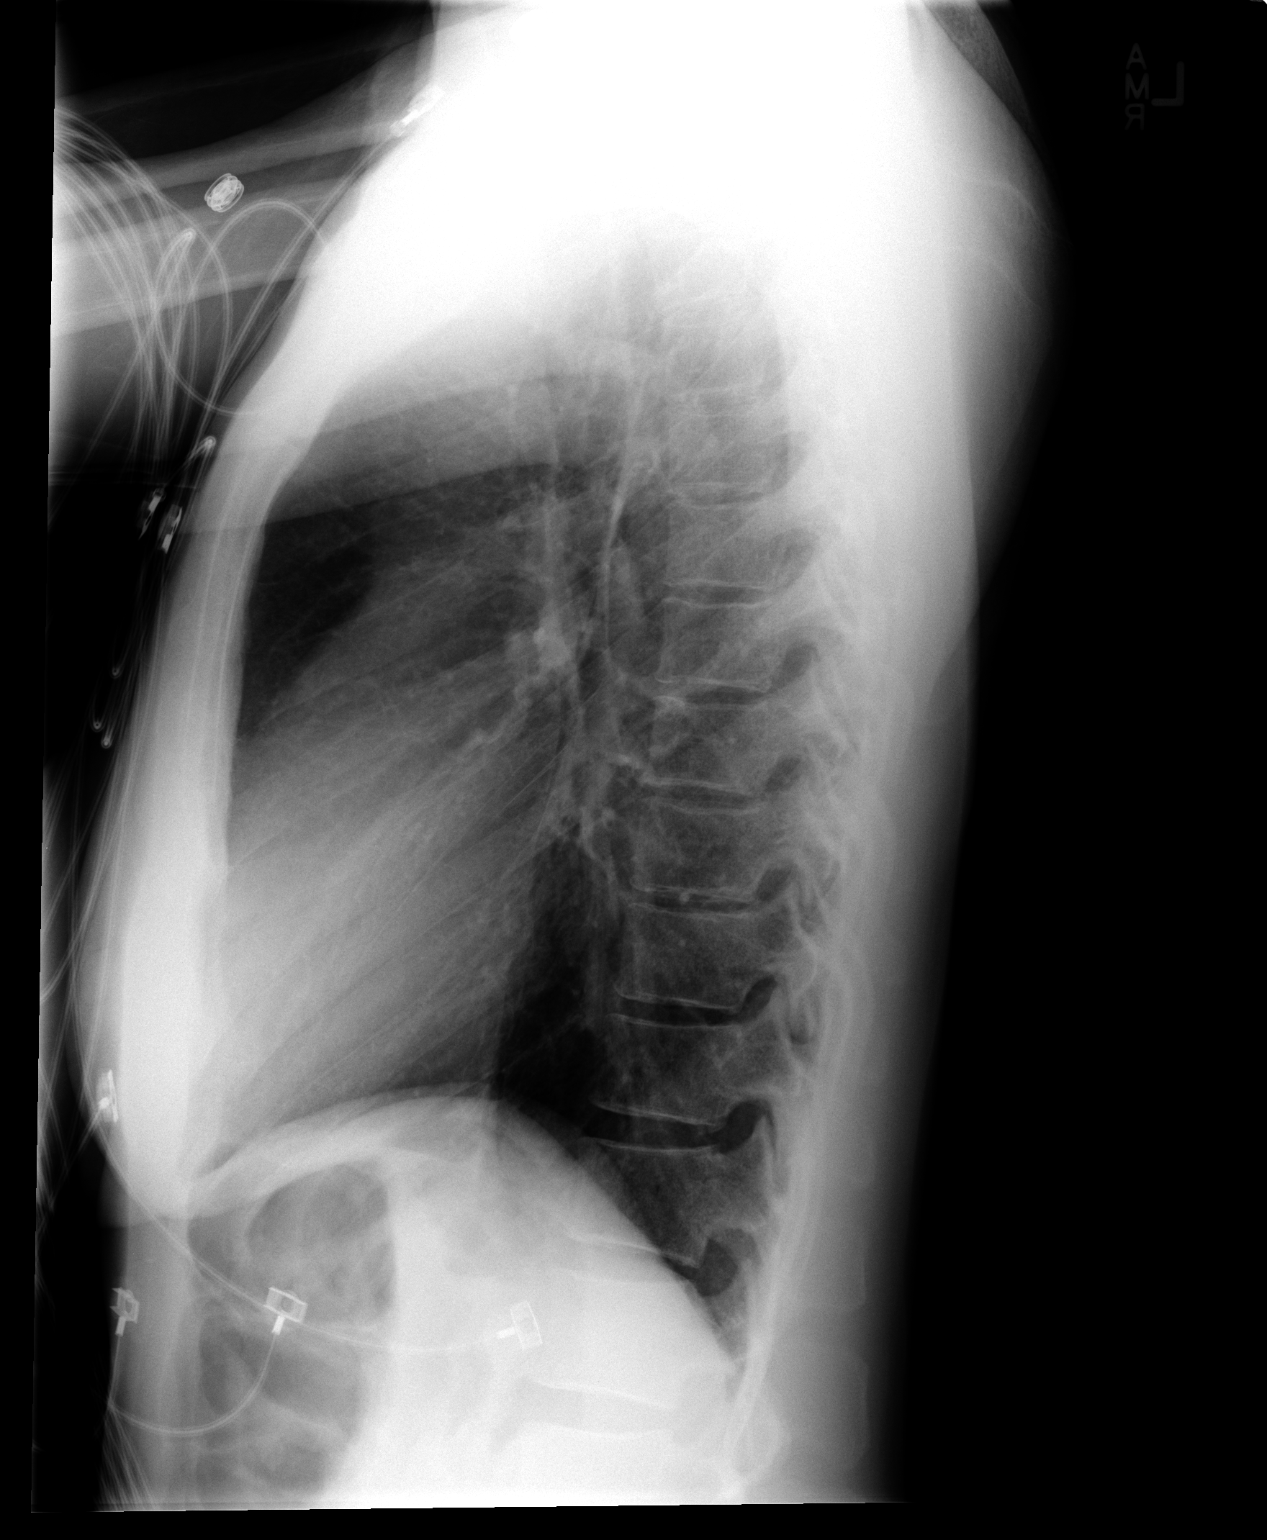

[2 of 2 positions shown; findings below may reference images not displayed]

FINDINGS: The lungs are hyperinflated. There is no focal infiltrate. There is
no pleural effusion or pneumothorax. The heart and pulmonary
vascularity are normal. The mediastinum is normal in width. The bony
thorax is unremarkable.
IMPRESSION: COPD or reactive airway disease. There is no pneumonia nor CHF nor
other acute cardiopulmonary abnormality.

## 2013-08-09 MED ORDER — ONDANSETRON HCL 4 MG/2ML IJ SOLN
4.0000 mg | Freq: Once | INTRAMUSCULAR | Status: AC
  Administered 2013-08-09: 4 mg via INTRAVENOUS
  Filled 2013-08-09: qty 2

## 2013-08-09 MED ORDER — MORPHINE SULFATE 4 MG/ML IJ SOLN
6.0000 mg | Freq: Once | INTRAMUSCULAR | Status: AC
  Administered 2013-08-09: 6 mg via INTRAMUSCULAR
  Filled 2013-08-09: qty 2

## 2013-08-09 MED ORDER — PREDNISONE 10 MG PO TABS: ORAL_TABLET | ORAL | Status: DC

## 2013-08-09 NOTE — ED Notes (Signed)
Patient at this time is resting quietly. Will continue to monitor

## 2013-08-09 NOTE — Discharge Instructions (Signed)
Chest Pain (Nonspecific) °It is often hard to give a specific diagnosis for the cause of chest pain. There is always a chance that your pain could be related to something serious, such as a heart attack or a blood clot in the lungs. You need to follow up with your health care provider for further evaluation. °CAUSES  °· Heartburn. °· Pneumonia or bronchitis. °· Anxiety or stress. °· Inflammation around your heart (pericarditis) or lung (pleuritis or pleurisy). °· A blood clot in the lung. °· A collapsed lung (pneumothorax). It can develop suddenly on its own (spontaneous pneumothorax) or from trauma to the chest. °· Shingles infection (herpes zoster virus). °The chest wall is composed of bones, muscles, and cartilage. Any of these can be the source of the pain. °· The bones can be bruised by injury. °· The muscles or cartilage can be strained by coughing or overwork. °· The cartilage can be affected by inflammation and become sore (costochondritis). °DIAGNOSIS  °Lab tests or other studies may be needed to find the cause of your pain. Your health care provider may have you take a test called an ambulatory electrocardiogram (ECG). An ECG records your heartbeat patterns over a 24-hour period. You may also have other tests, such as: °· Transthoracic echocardiogram (TTE). During echocardiography, sound waves are used to evaluate how blood flows through your heart. °· Transesophageal echocardiogram (TEE). °· Cardiac monitoring. This allows your health care provider to monitor your heart rate and rhythm in real time. °· Holter monitor. This is a portable device that records your heartbeat and can help diagnose heart arrhythmias. It allows your health care provider to track your heart activity for several days, if needed. °· Stress tests by exercise or by giving medicine that makes the heart beat faster. °TREATMENT  °· Treatment depends on what may be causing your chest pain. Treatment may include: °¨ Acid blockers for  heartburn. °¨ Anti-inflammatory medicine. °¨ Pain medicine for inflammatory conditions. °¨ Antibiotics if an infection is present. °· You may be advised to change lifestyle habits. This includes stopping smoking and avoiding alcohol, caffeine, and chocolate. °· You may be advised to keep your head raised (elevated) when sleeping. This reduces the chance of acid going backward from your stomach into your esophagus. °Most of the time, nonspecific chest pain will improve within 2-3 days with rest and mild pain medicine.  °HOME CARE INSTRUCTIONS  °· If antibiotics were prescribed, take them as directed. Finish them even if you start to feel better. °· For the next few days, avoid physical activities that bring on chest pain. Continue physical activities as directed. °· Do not use any tobacco products, including cigarettes, chewing tobacco, or electronic cigarettes. °· Avoid drinking alcohol. °· Only take medicine as directed by your health care provider. °· Follow your health care provider's suggestions for further testing if your chest pain does not go away. °· Keep any follow-up appointments you made. If you do not go to an appointment, you could develop lasting (chronic) problems with pain. If there is any problem keeping an appointment, call to reschedule. °SEEK MEDICAL CARE IF:  °· Your chest pain does not go away, even after treatment. °· You have a rash with blisters on your chest. °· You have a fever. °SEEK IMMEDIATE MEDICAL CARE IF:  °· You have increased chest pain or pain that spreads to your arm, neck, jaw, back, or abdomen. °· You have shortness of breath. °· You have an increasing cough, or you cough   up blood.  You have severe back or abdominal pain.  You feel nauseous or vomit.  You have severe weakness.  You faint.  You have chills. This is an emergency. Do not wait to see if the pain will go away. Get medical help at once. Call your local emergency services (911 in [place] Do not drive  yourself to the hospital. MAKE SURE YOU:   Understand these instructions.  Will watch your condition.  Will get help right away if you are not doing well or get worse. Document Released: 10/16/2004 Document Revised: 01/11/2013 Document Reviewed: 08/12/2007 [facility] Patient Information 2015 [place], [place]. This information is not intended to replace advice given to you by your health care provider. Make sure you discuss any questions you have with your health care provider.  Contact Dermatitis Contact dermatitis is a rash that happens when something touches the skin. You touched something that irritates your skin, or you have allergies to something you touched. HOME CARE   Avoid the thing that caused your rash.  Keep your rash away from hot water, soap, sunlight, chemicals, and other things that might bother it.  Do not scratch your rash.  You can take cool baths to help stop itching.  Only take medicine as told by your doctor.  Keep all doctor visits as told. GET HELP RIGHT AWAY IF:   Your rash is not better after 3 days.  Your rash gets worse.  Your rash is puffy (swollen), tender, red, sore, or warm.  You have problems with your medicine. MAKE SURE YOU:   Understand these instructions.  Will watch your condition.  Will get help right away if you are not doing well or get worse. Document Released: 11/03/2008 Document Revised: 03/31/2011 Document Reviewed: 06/11/2010 [facility] Patient Information 2015 [place], [place]. This information is not intended to replace advice given to you by your health care provider. Make sure you discuss any questions you have with your health care provider.

## 2013-08-09 NOTE — ED Notes (Signed)
Chest pain for 4 days.  Sharp pains rating pain 9.  Pain mostly to right neck and chest.

## 2013-08-09 NOTE — ED Provider Notes (Signed)
CSN: [id number]     Arrival date & time 08/09/13  4268 History   First MD Initiated Contact with Patient 08/09/13 [phone]     Chief Complaint  Patient presents with  . Chest Pain     (Consider location/radiation/quality/duration/timing/severity/associated sxs/prior Treatment) HPI Comments: Pt states that ibuprofen was taking it away but today it hasn't helped. 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 No cardiac history. State that she has a rash on her chest both sides of her chest that she is not sure if she allergic to something. Has had a non productive cough times 2 months  Patient is a 43 y.o. female presenting with chest pain. The history is provided by the patient. No language interpreter was used.  Chest Pain Pain location:  R chest Pain quality: radiating and sharp   Pain radiates to:  Neck Pain radiates to the back: no   Pain severity:  Severe Onset quality:  Gradual Duration:  4 days Timing:  Constant Progression:  Unchanged Context: at rest   Context: not breathing and no movement   Associated symptoms: cough   Associated symptoms: no back pain, no fever, no shortness of breath, no syncope, not vomiting and no weakness   Risk factors: smoking     History reviewed. No pertinent past medical history. Past Surgical History  Procedure Laterality Date  . Leep     History reviewed. No pertinent family history. History  Substance Use Topics  . Smoking status: Current Every Day Smoker -- 0.50 packs/day    Types: Cigarettes  . Smokeless tobacco: Never Used  . Alcohol Use: No   OB History   Grav Para Term Preterm Abortions TAB SAB Ect Mult Living   3 2 2  0 1 0 0 0 0 2     Review of Systems  Constitutional: Negative for fever.  Respiratory: Positive for cough. Negative for shortness of breath.   Cardiovascular: Positive for chest pain. Negative for syncope.  Gastrointestinal: Negative for vomiting.  Musculoskeletal:  Negative for back pain.  Neurological: Negative for weakness.      Allergies  Review of patient's allergies indicates no known allergies.  Home Medications   Prior to Admission medications   Medication Sig Start Date End Date Taking? Authorizing Provider  metroNIDAZOLE (FLAGYL) 500 MG tablet Take 1 tablet (500 mg total) by mouth 2 (two) times daily. 09/10/12   Tatyana A Kirichenko, PA-C   BP 129/85  Pulse 92  Temp(Src) 98.8 F (37.1 C) (Oral)  Resp 18  SpO2 99%  LMP 08/02/2013 Physical Exam  Nursing note and vitals reviewed. Constitutional: She is oriented to person, place, and time. She appears well-developed and well-nourished.  HENT:  Head: Normocephalic and atraumatic.  Neck: Normal range of motion. Neck supple.  Cardiovascular: Normal rate and regular rhythm.   Pulmonary/Chest: Effort normal and breath sounds normal. She exhibits no tenderness.  Abdominal: Bowel sounds are normal. There is no tenderness.  Musculoskeletal: Normal range of motion.  Neurological: She is alert and oriented to person, place, and time. Coordination normal.  Skin:  Papular rash to bilaterally chest  Psychiatric:  Tearful on exam    ED Course  Procedures (including critical care time) Labs Review Labs Reviewed  BASIC METABOLIC PANEL - Abnormal; Notable for the following:    Potassium 3.6 (*)    Glucose, Bld 115 (*)    All other components within normal limits  CBC WITH DIFFERENTIAL  Randolm Idol, ED    Imaging Review No results found.   EKG Interpretation   Date/Time:  Tuesday August 09 2013 09:48:21 EDT Ventricular Rate:  70 PR Interval:  171 QRS Duration: 91 QT Interval:  377 QTC Calculation: 407 R Axis:   33 Text Interpretation:  Sinus rhythm Right atrial enlargement No previous  ECGs available Confirmed by ZACKOWSKI  MD, SCOTT ([id number]) on 08/09/2013  9:54:03 AM      MDM   Final diagnoses:  Other chest pain  Contact dermatitis    Pt is pain free. Doubt acs.  Discussed follow up with pt for continued symptoms. Discussed smoking cessation.will treat dermatitis with prednisone    Glendell Docker, NP 08/09/13 1348

## 2013-08-10 NOTE — ED Provider Notes (Signed)
Medical screening examination/treatment/procedure(s) were performed by non-physician practitioner and as supervising physician I was immediately available for consultation/collaboration.   EKG Interpretation   Date/Time:  Tuesday August 09 2013 09:48:21 EDT Ventricular Rate:  70 PR Interval:  171 QRS Duration: 91 QT Interval:  377 QTC Calculation: 407 R Axis:   33 Text Interpretation:  Sinus rhythm Right atrial enlargement No previous  ECGs available Confirmed by Lucas Exline  MD, Darlin Stenseth ([id number]) on 08/09/2013  9:54:03 AM        Fredia Sorrow, MD 08/10/13 [phone]

## 2013-10-12 ENCOUNTER — Ambulatory Visit: Payer: Medicaid Other | Admitting: Obstetrics and Gynecology

## 2013-11-21 ENCOUNTER — Encounter (HOSPITAL_COMMUNITY): Payer: Self-pay | Admitting: Emergency Medicine

## 2013-11-30 ENCOUNTER — Encounter: Payer: Self-pay | Admitting: Obstetrics & Gynecology

## 2013-11-30 ENCOUNTER — Ambulatory Visit (INDEPENDENT_AMBULATORY_CARE_PROVIDER_SITE_OTHER): Payer: Self-pay | Admitting: Obstetrics & Gynecology

## 2013-11-30 VITALS — BP 148/78 | HR 73 | Ht 71.0 in | Wt 150.3 lb

## 2013-11-30 DIAGNOSIS — Z01419 Encounter for gynecological examination (general) (routine) without abnormal findings: Secondary | ICD-10-CM

## 2013-11-30 DIAGNOSIS — L309 Dermatitis, unspecified: Secondary | ICD-10-CM

## 2013-11-30 DIAGNOSIS — Z3042 Encounter for surveillance of injectable contraceptive: Secondary | ICD-10-CM

## 2013-11-30 DIAGNOSIS — Z1151 Encounter for screening for human papillomavirus (HPV): Secondary | ICD-10-CM

## 2013-11-30 DIAGNOSIS — N390 Urinary tract infection, site not specified: Secondary | ICD-10-CM

## 2013-11-30 DIAGNOSIS — Z124 Encounter for screening for malignant neoplasm of cervix: Secondary | ICD-10-CM

## 2013-11-30 DIAGNOSIS — Z1231 Encounter for screening mammogram for malignant neoplasm of breast: Secondary | ICD-10-CM

## 2013-11-30 LAB — POCT URINALYSIS DIP (DEVICE)
BILIRUBIN URINE: NEGATIVE
GLUCOSE, UA: NEGATIVE mg/dL
Ketones, ur: NEGATIVE mg/dL
LEUKOCYTES UA: NEGATIVE
NITRITE: POSITIVE — AB
Protein, ur: NEGATIVE mg/dL
Specific Gravity, Urine: 1.02 (ref 1.005–1.030)
UROBILINOGEN UA: 1 mg/dL (ref 0.0–1.0)
pH: 5.5 (ref 5.0–8.0)

## 2013-11-30 LAB — POCT PREGNANCY, URINE: PREG TEST UR: NEGATIVE

## 2013-11-30 MED ORDER — CIPROFLOXACIN HCL 500 MG PO TABS: 500.0000 mg | ORAL_TABLET | Freq: Two times a day (BID) | ORAL | Status: DC

## 2013-11-30 MED ORDER — MEDROXYPROGESTERONE ACETATE 150 MG/ML IM SUSP
150.0000 mg | INTRAMUSCULAR | Status: DC
  Administered 2013-11-30 – 2016-01-04 (×7): 150 mg via INTRAMUSCULAR

## 2013-11-30 MED ORDER — CLOBETASOL PROPIONATE 0.05 % EX OINT: 1.0000 "application " | TOPICAL_OINTMENT | Freq: Two times a day (BID) | CUTANEOUS | Status: DC

## 2013-11-30 NOTE — Progress Notes (Signed)
    GYNECOLOGY CLINIC ANNUAL PREVENTATIVE CARE ENCOUNTER NOTE  Subjective:     Mary Ramos is a 43 y.o. G34P2012 female here for a routine annual gynecologic exam.  Current complaints: rash on breasts and chest for a few weeks.  Rash is pruritic and she has used hydrocortisone cream which did not help.    Also wants Depo Provera injection.  Patient also reports urinary symptoms: dysuria, foul-smelling urine for a few days. No fevers or other concerns.   Gynecologic History No LMP recorded. Contraception: Depo-Provera injections Last Pap: 02/11/2008. Results were: normal Last mammogram: 2011. Results were: normal  Obstetric History OB History  Gravida Para Term Preterm AB SAB TAB Ectopic Multiple Living  3 2 2  0 1 0 0 0 0 2    # Outcome Date GA Lbr Len/2nd Weight Sex Delivery Anes PTL Lv  3 AB           2 Term      Vag-Spont     1 Term      Vag-Spont        The following portions of the patient's history were reviewed and updated as appropriate: allergies, current medications, past family history, past medical history, past social history, past surgical history and problem list.  Review of Systems Pertinent items are noted in HPI.    Objective:   BP 148/78 mmHg  Pulse 73  Ht 5\' 11"  (1.803 m)  Wt 150 lb 4.8 oz (68.176 kg)  BMI 20.97 kg/m2 GENERAL: Well-developed, well-nourished female in no acute distress.  HEENT: Normocephalic, atraumatic. Sclerae anicteric.  NECK: Supple. Normal thyroid.  LUNGS: Clear to auscultation bilaterally.  HEART: Regular rate and rhythm. BREASTS: Symmetric in size. No masses, skin changes, nipple drainage, or lymphadenopathy. Miliarial rash noted on breasts extending to axilla on both sides with some excoriation. ABDOMEN: Soft, nontender, nondistended. No organomegaly. PELVIC: Normal external female genitalia. Vagina is pink and rugated.  Normal discharge. Normal cervix contour. Pap smear obtained. Uterus is normal in size. No adnexal mass or  tenderness.  EXTREMITIES: No cyanosis, clubbing, or edema, 2+ distal pulses.  Results for orders placed or performed in visit on 11/30/13 (from the past 24 hour(s))  Pregnancy, urine POC     Status: None   Collection Time: 11/30/13  1:37 PM  Result Value Ref Range   Preg Test, Ur NEGATIVE NEGATIVE  POCT urinalysis dip (device)     Status: Abnormal   Collection Time: 11/30/13  2:01 PM  Result Value Ref Range   Glucose, UA NEGATIVE NEGATIVE mg/dL   Bilirubin Urine NEGATIVE NEGATIVE   Ketones, ur NEGATIVE NEGATIVE mg/dL   Specific Gravity, Urine 1.020 1.005 - 1.030   Hgb urine dipstick MODERATE (A) NEGATIVE   pH 5.5 5.0 - 8.0   Protein, ur NEGATIVE NEGATIVE mg/dL   Urobilinogen, UA 1.0 0.0 - 1.0 mg/dL   Nitrite POSITIVE (A) NEGATIVE   Leukocytes, UA NEGATIVE NEGATIVE    Assessment:   Annual gynecologic examination Dermatitis  UTI   Plan:   Pap done, will follow up results and manage accordingly. Mammogram scheduled Depo Provera re-started for contraception; return in 3 months Clobetasol ointment prescribed for likely dermatitis. Follow up with PCP if worsens. Ciprofloxacin prescribed for UTI, urine culture sent. Will follow up results and manage accordingly. Routine preventative health maintenance measures emphasized   Verita Schneiders, MD, [place] Attending [place] for [place]

## 2013-11-30 NOTE — Patient Instructions (Signed)
Preventive Care for Adults A healthy lifestyle and preventive care can promote health and wellness. Preventive health guidelines for women include the following key practices.  A routine yearly physical is a good way to check with your health care provider about your health and preventive screening. It is a chance to share any concerns and updates on your health and to receive a thorough exam.  Visit your dentist for a routine exam and preventive care every 6 months. Brush your teeth twice a day and floss once a day. Good oral hygiene prevents tooth decay and gum disease.  The frequency of eye exams is based on your age, health, family medical history, use of contact lenses, and other factors. Follow your health care provider's recommendations for frequency of eye exams.  Eat a healthy diet. Foods like vegetables, fruits, whole grains, low-fat dairy products, and lean protein foods contain the nutrients you need without too many calories. Decrease your intake of foods high in solid fats, added sugars, and salt. Eat the right amount of calories for you.Get information about a proper diet from your health care provider, if necessary.  Regular physical exercise is one of the most important things you can do for your health. Most adults should get at least 150 minutes of moderate-intensity exercise (any activity that increases your heart rate and causes you to sweat) each week. In addition, most adults need muscle-strengthening exercises on 2 or more days a week.  Maintain a healthy weight. The body mass index (BMI) is a screening tool to identify possible weight problems. It provides an estimate of body fat based on height and weight. Your health care provider can find your BMI and can help you achieve or maintain a healthy weight.For adults 20 years and older:  A BMI below 18.5 is considered underweight.  A BMI of 18.5 to 24.9 is normal.  A BMI of 25 to 29.9 is considered overweight.  A BMI of  30 and above is considered obese.  Maintain normal blood lipids and cholesterol levels by exercising and minimizing your intake of saturated fat. Eat a balanced diet with plenty of fruit and vegetables. Blood tests for lipids and cholesterol should begin at age 76 and be repeated every 5 years. If your lipid or cholesterol levels are high, you are over 50, or you are at high risk for heart disease, you may need your cholesterol levels checked more frequently.Ongoing high lipid and cholesterol levels should be treated with medicines if diet and exercise are not working.  If you smoke, find out from your health care provider how to quit. If you do not use tobacco, do not start.  Lung cancer screening is recommended for adults aged 22-80 years who are at high risk for developing lung cancer because of a history of smoking. A yearly low-dose CT scan of the lungs is recommended for people who have at least a 30-pack-year history of smoking and are a current smoker or have quit within the past 15 years. A pack year of smoking is smoking an average of 1 pack of cigarettes a day for 1 year (for example: 1 pack a day for 30 years or 2 packs a day for 15 years). Yearly screening should continue until the smoker has stopped smoking for at least 15 years. Yearly screening should be stopped for people who develop a health problem that would prevent them from having lung cancer treatment.  If you are pregnant, do not drink alcohol. If you are breastfeeding,  be very cautious about drinking alcohol. If you are not pregnant and choose to drink alcohol, do not have more than 1 drink per day. One drink is considered to be 12 ounces (355 mL) of beer, 5 ounces (148 mL) of wine, or 1.5 ounces (44 mL) of liquor.  Avoid use of street drugs. Do not share needles with anyone. Ask for help if you need support or instructions about stopping the use of drugs.  High blood pressure causes heart disease and increases the risk of  stroke. Your blood pressure should be checked at least every 1 to 2 years. Ongoing high blood pressure should be treated with medicines if weight loss and exercise do not work.  If you are 75-52 years old, ask your health care provider if you should take aspirin to prevent strokes.  Diabetes screening involves taking a blood sample to check your fasting blood sugar level. This should be done once every 3 years, after age 15, if you are within normal weight and without risk factors for diabetes. Testing should be considered at a younger age or be carried out more frequently if you are overweight and have at least 1 risk factor for diabetes.  Breast cancer screening is essential preventive care for women. You should practice "breast self-awareness." This means understanding the normal appearance and feel of your breasts and may include breast self-examination. Any changes detected, no matter how small, should be reported to a health care provider. Women in their 58s and 30s should have a clinical breast exam (CBE) by a health care provider as part of a regular health exam every 1 to 3 years. After age 16, women should have a CBE every year. Starting at age 53, women should consider having a mammogram (breast X-ray test) every year. Women who have a family history of breast cancer should talk to their health care provider about genetic screening. Women at a high risk of breast cancer should talk to their health care providers about having an MRI and a mammogram every year.  Breast cancer gene (BRCA)-related cancer risk assessment is recommended for women who have family members with BRCA-related cancers. BRCA-related cancers include breast, ovarian, tubal, and peritoneal cancers. Having family members with these cancers may be associated with an increased risk for harmful changes (mutations) in the breast cancer genes BRCA1 and BRCA2. Results of the assessment will determine the need for genetic counseling and  BRCA1 and BRCA2 testing.  Routine pelvic exams to screen for cancer are no longer recommended for nonpregnant women who are considered low risk for cancer of the pelvic organs (ovaries, uterus, and vagina) and who do not have symptoms. Ask your health care provider if a screening pelvic exam is right for you.  If you have had past treatment for cervical cancer or a condition that could lead to cancer, you need Pap tests and screening for cancer for at least 20 years after your treatment. If Pap tests have been discontinued, your risk factors (such as having a new sexual partner) need to be reassessed to determine if screening should be resumed. Some women have medical problems that increase the chance of getting cervical cancer. In these cases, your health care provider may recommend more frequent screening and Pap tests.  The HPV test is an additional test that may be used for cervical cancer screening. The HPV test looks for the virus that can cause the cell changes on the cervix. The cells collected during the Pap test can be  tested for HPV. The HPV test could be used to screen women aged 30 years and older, and should be used in women of any age who have unclear Pap test results. After the age of 30, women should have HPV testing at the same frequency as a Pap test.  Colorectal cancer can be detected and often prevented. Most routine colorectal cancer screening begins at the age of 50 years and continues through age 75 years. However, your health care provider may recommend screening at an earlier age if you have risk factors for colon cancer. On a yearly basis, your health care provider may provide home test kits to check for hidden blood in the stool. Use of a small camera at the end of a tube, to directly examine the colon (sigmoidoscopy or colonoscopy), can detect the earliest forms of colorectal cancer. Talk to your health care provider about this at age 50, when routine screening begins. Direct  exam of the colon should be repeated every 5-10 years through age 75 years, unless early forms of pre-cancerous polyps or small growths are found.  People who are at an increased risk for hepatitis B should be screened for this virus. You are considered at high risk for hepatitis B if:  You were born in a country where hepatitis B occurs often. Talk with your health care provider about which countries are considered high risk.  Your parents were born in a high-risk country and you have not received a shot to protect against hepatitis B (hepatitis B vaccine).  You have HIV or AIDS.  You use needles to inject street drugs.  You live with, or have sex with, someone who has hepatitis B.  You get hemodialysis treatment.  You take certain medicines for conditions like cancer, organ transplantation, and autoimmune conditions.  Hepatitis C blood testing is recommended for all people born from 1945 through 1965 and any individual with known risks for hepatitis C.  Practice safe sex. Use condoms and avoid high-risk sexual practices to reduce the spread of sexually transmitted infections (STIs). STIs include gonorrhea, chlamydia, syphilis, trichomonas, herpes, HPV, and human immunodeficiency virus (HIV). Herpes, HIV, and HPV are viral illnesses that have no cure. They can result in disability, cancer, and death.  You should be screened for sexually transmitted illnesses (STIs) including gonorrhea and chlamydia if:  You are sexually active and are younger than 24 years.  You are older than 24 years and your health care provider tells you that you are at risk for this type of infection.  Your sexual activity has changed since you were last screened and you are at an increased risk for chlamydia or gonorrhea. Ask your health care provider if you are at risk.  If you are at risk of being infected with HIV, it is recommended that you take a prescription medicine daily to prevent HIV infection. This is  called preexposure prophylaxis (PrEP). You are considered at risk if:  You are a heterosexual woman, are sexually active, and are at increased risk for HIV infection.  You take drugs by injection.  You are sexually active with a partner who has HIV.  Talk with your health care provider about whether you are at high risk of being infected with HIV. If you choose to begin PrEP, you should first be tested for HIV. You should then be tested every 3 months for as long as you are taking PrEP.  Osteoporosis is a disease in which the bones lose minerals and strength   with aging. This can result in serious bone fractures or breaks. The risk of osteoporosis can be identified using a bone density scan. Women ages 65 years and over and women at risk for fractures or osteoporosis should discuss screening with their health care providers. Ask your health care provider whether you should take a calcium supplement or vitamin D to reduce the rate of osteoporosis.  Menopause can be associated with physical symptoms and risks. Hormone replacement therapy is available to decrease symptoms and risks. You should talk to your health care provider about whether hormone replacement therapy is right for you.  Use sunscreen. Apply sunscreen liberally and repeatedly throughout the day. You should seek shade when your shadow is shorter than you. Protect yourself by wearing long sleeves, pants, a wide-brimmed hat, and sunglasses year round, whenever you are outdoors.  Once a month, do a whole body skin exam, using a mirror to look at the skin on your back. Tell your health care provider of new moles, moles that have irregular borders, moles that are larger than a pencil eraser, or moles that have changed in shape or color.  Stay current with required vaccines (immunizations).  Influenza vaccine. All adults should be immunized every year.  Tetanus, diphtheria, and acellular pertussis (Td, Tdap) vaccine. Pregnant women should  receive 1 dose of Tdap vaccine during each pregnancy. The dose should be obtained regardless of the length of time since the last dose. Immunization is preferred during the 27th-36th week of gestation. An adult who has not previously received Tdap or who does not know her vaccine status should receive 1 dose of Tdap. This initial dose should be followed by tetanus and diphtheria toxoids (Td) booster doses every 10 years. Adults with an unknown or incomplete history of completing a 3-dose immunization series with Td-containing vaccines should begin or complete a primary immunization series including a Tdap dose. Adults should receive a Td booster every 10 years.  Varicella vaccine. An adult without evidence of immunity to varicella should receive 2 doses or a second dose if she has previously received 1 dose. Pregnant females who do not have evidence of immunity should receive the first dose after pregnancy. This first dose should be obtained before leaving the health care facility. The second dose should be obtained 4-8 weeks after the first dose.  Human papillomavirus (HPV) vaccine. Females aged 13-26 years who have not received the vaccine previously should obtain the 3-dose series. The vaccine is not recommended for use in pregnant females. However, pregnancy testing is not needed before receiving a dose. If a female is found to be pregnant after receiving a dose, no treatment is needed. In that case, the remaining doses should be delayed until after the pregnancy. Immunization is recommended for any person with an immunocompromised condition through the age of 26 years if she did not get any or all doses earlier. During the 3-dose series, the second dose should be obtained 4-8 weeks after the first dose. The third dose should be obtained 24 weeks after the first dose and 16 weeks after the second dose.  Zoster vaccine. One dose is recommended for adults aged 60 years or older unless certain conditions are  present.  Measles, mumps, and rubella (MMR) vaccine. Adults born before 1957 generally are considered immune to measles and mumps. Adults born in 1957 or later should have 1 or more doses of MMR vaccine unless there is a contraindication to the vaccine or there is laboratory evidence of immunity to   each of the three diseases. A routine second dose of MMR vaccine should be obtained at least 28 days after the first dose for students attending postsecondary schools, health care workers, or international travelers. People who received inactivated measles vaccine or an unknown type of measles vaccine during 1963-1967 should receive 2 doses of MMR vaccine. People who received inactivated mumps vaccine or an unknown type of mumps vaccine before 1979 and are at high risk for mumps infection should consider immunization with 2 doses of MMR vaccine. For females of childbearing age, rubella immunity should be determined. If there is no evidence of immunity, females who are not pregnant should be vaccinated. If there is no evidence of immunity, females who are pregnant should delay immunization until after pregnancy. Unvaccinated health care workers born before 1957 who lack laboratory evidence of measles, mumps, or rubella immunity or laboratory confirmation of disease should consider measles and mumps immunization with 2 doses of MMR vaccine or rubella immunization with 1 dose of MMR vaccine.  Pneumococcal 13-valent conjugate (PCV13) vaccine. When indicated, a person who is uncertain of her immunization history and has no record of immunization should receive the PCV13 vaccine. An adult aged 19 years or older who has certain medical conditions and has not been previously immunized should receive 1 dose of PCV13 vaccine. This PCV13 should be followed with a dose of pneumococcal polysaccharide (PPSV23) vaccine. The PPSV23 vaccine dose should be obtained at least 8 weeks after the dose of PCV13 vaccine. An adult aged 19  years or older who has certain medical conditions and previously received 1 or more doses of PPSV23 vaccine should receive 1 dose of PCV13. The PCV13 vaccine dose should be obtained 1 or more years after the last PPSV23 vaccine dose.  Pneumococcal polysaccharide (PPSV23) vaccine. When PCV13 is also indicated, PCV13 should be obtained first. All adults aged 65 years and older should be immunized. An adult younger than age 65 years who has certain medical conditions should be immunized. Any person who resides in a nursing home or long-term care facility should be immunized. An adult smoker should be immunized. People with an immunocompromised condition and certain other conditions should receive both PCV13 and PPSV23 vaccines. People with human immunodeficiency virus (HIV) infection should be immunized as soon as possible after diagnosis. Immunization during chemotherapy or radiation therapy should be avoided. Routine use of PPSV23 vaccine is not recommended for American Indians, [place] Natives, or people younger than 65 years unless there are medical conditions that require PPSV23 vaccine. When indicated, people who have unknown immunization and have no record of immunization should receive PPSV23 vaccine. One-time revaccination 5 years after the first dose of PPSV23 is recommended for people aged 19-64 years who have chronic kidney failure, nephrotic syndrome, asplenia, or immunocompromised conditions. People who received 1-2 doses of PPSV23 before age 65 years should receive another dose of PPSV23 vaccine at age 65 years or later if at least 5 years have passed since the previous dose. Doses of PPSV23 are not needed for people immunized with PPSV23 at or after age 65 years.  Meningococcal vaccine. Adults with asplenia or persistent complement component deficiencies should receive 2 doses of quadrivalent meningococcal conjugate (MenACWY-D) vaccine. The doses should be obtained at least 2 months apart.  Microbiologists working with certain meningococcal bacteria, military recruits, people at risk during an outbreak, and people who travel to or live in countries with a high rate of meningitis should be immunized. A first-year college student up through age   21 years who is living in a residence hall should receive a dose if she did not receive a dose on or after her 16th birthday. Adults who have certain high-risk conditions should receive one or more doses of vaccine.  Hepatitis A vaccine. Adults who wish to be protected from this disease, have certain high-risk conditions, work with hepatitis A-infected animals, work in hepatitis A research labs, or travel to or work in countries with a high rate of hepatitis A should be immunized. Adults who were previously unvaccinated and who anticipate close contact with an international adoptee during the first 60 days after arrival in the [place] from a country with a high rate of hepatitis A should be immunized.  Hepatitis B vaccine. Adults who wish to be protected from this disease, have certain high-risk conditions, may be exposed to blood or other infectious body fluids, are household contacts or sex partners of hepatitis B positive people, are clients or workers in certain care facilities, or travel to or work in countries with a high rate of hepatitis B should be immunized.  Haemophilus influenzae type b (Hib) vaccine. A previously unvaccinated person with asplenia or sickle cell disease or having a scheduled splenectomy should receive 1 dose of Hib vaccine. Regardless of previous immunization, a recipient of a hematopoietic stem cell transplant should receive a 3-dose series 6-12 months after her successful transplant. Hib vaccine is not recommended for adults with HIV infection. Preventive Services / Frequency Ages 64 to 68 years  Blood pressure check.** / Every 1 to 2 years.  Lipid and cholesterol check.** / Every 5 years beginning at age  22.  Clinical breast exam.** / Every 3 years for women in their 88s and 53s.  BRCA-related cancer risk assessment.** / For women who have family members with a BRCA-related cancer (breast, ovarian, tubal, or peritoneal cancers).  Pap test.** / Every 2 years from ages 90 through 51. Every 3 years starting at age 21 through age 56 or 3 with a history of 3 consecutive normal Pap tests.  HPV screening.** / Every 3 years from ages 24 through ages 1 to 46 with a history of 3 consecutive normal Pap tests.  Hepatitis C blood test.** / For any individual with known risks for hepatitis C.  Skin self-exam. / Monthly.  Influenza vaccine. / Every year.  Tetanus, diphtheria, and acellular pertussis (Tdap, Td) vaccine.** / Consult your health care provider. Pregnant women should receive 1 dose of Tdap vaccine during each pregnancy. 1 dose of Td every 10 years.  Varicella vaccine.** / Consult your health care provider. Pregnant females who do not have evidence of immunity should receive the first dose after pregnancy.  HPV vaccine. / 3 doses over 6 months, if 72 and younger. The vaccine is not recommended for use in pregnant females. However, pregnancy testing is not needed before receiving a dose.  Measles, mumps, rubella (MMR) vaccine.** / You need at least 1 dose of MMR if you were born in 1957 or later. You may also need a 2nd dose. For females of childbearing age, rubella immunity should be determined. If there is no evidence of immunity, females who are not pregnant should be vaccinated. If there is no evidence of immunity, females who are pregnant should delay immunization until after pregnancy.  Pneumococcal 13-valent conjugate (PCV13) vaccine.** / Consult your health care provider.  Pneumococcal polysaccharide (PPSV23) vaccine.** / 1 to 2 doses if you smoke cigarettes or if you have certain conditions.  Meningococcal vaccine.** /  1 dose if you are age 19 to 21 years and a first-year college  student living in a residence hall, or have one of several medical conditions, you need to get vaccinated against meningococcal disease. You may also need additional booster doses.  Hepatitis A vaccine.** / Consult your health care provider.  Hepatitis B vaccine.** / Consult your health care provider.  Haemophilus influenzae type b (Hib) vaccine.** / Consult your health care provider. Ages 40 to 64 years  Blood pressure check.** / Every 1 to 2 years.  Lipid and cholesterol check.** / Every 5 years beginning at age 20 years.  Lung cancer screening. / Every year if you are aged 55-80 years and have a 30-pack-year history of smoking and currently smoke or have quit within the past 15 years. Yearly screening is stopped once you have quit smoking for at least 15 years or develop a health problem that would prevent you from having lung cancer treatment.  Clinical breast exam.** / Every year after age 40 years.  BRCA-related cancer risk assessment.** / For women who have family members with a BRCA-related cancer (breast, ovarian, tubal, or peritoneal cancers).  Mammogram.** / Every year beginning at age 40 years and continuing for as long as you are in good health. Consult with your health care provider.  Pap test.** / Every 3 years starting at age 30 years through age 65 or 70 years with a history of 3 consecutive normal Pap tests.  HPV screening.** / Every 3 years from ages 30 years through ages 65 to 70 years with a history of 3 consecutive normal Pap tests.  Fecal occult blood test (FOBT) of stool. / Every year beginning at age 50 years and continuing until age 75 years. You may not need to do this test if you get a colonoscopy every 10 years.  Flexible sigmoidoscopy or colonoscopy.** / Every 5 years for a flexible sigmoidoscopy or every 10 years for a colonoscopy beginning at age 50 years and continuing until age 75 years.  Hepatitis C blood test.** / For all people born from 1945 through  1965 and any individual with known risks for hepatitis C.  Skin self-exam. / Monthly.  Influenza vaccine. / Every year.  Tetanus, diphtheria, and acellular pertussis (Tdap/Td) vaccine.** / Consult your health care provider. Pregnant women should receive 1 dose of Tdap vaccine during each pregnancy. 1 dose of Td every 10 years.  Varicella vaccine.** / Consult your health care provider. Pregnant females who do not have evidence of immunity should receive the first dose after pregnancy.  Zoster vaccine.** / 1 dose for adults aged 60 years or older.  Measles, mumps, rubella (MMR) vaccine.** / You need at least 1 dose of MMR if you were born in 1957 or later. You may also need a 2nd dose. For females of childbearing age, rubella immunity should be determined. If there is no evidence of immunity, females who are not pregnant should be vaccinated. If there is no evidence of immunity, females who are pregnant should delay immunization until after pregnancy.  Pneumococcal 13-valent conjugate (PCV13) vaccine.** / Consult your health care provider.  Pneumococcal polysaccharide (PPSV23) vaccine.** / 1 to 2 doses if you smoke cigarettes or if you have certain conditions.  Meningococcal vaccine.** / Consult your health care provider.  Hepatitis A vaccine.** / Consult your health care provider.  Hepatitis B vaccine.** / Consult your health care provider.  Haemophilus influenzae type b (Hib) vaccine.** / Consult your health care provider. Ages 65   years and over  Blood pressure check.** / Every 1 to 2 years.  Lipid and cholesterol check.** / Every 5 years beginning at age 22 years.  Lung cancer screening. / Every year if you are aged 73-80 years and have a 30-pack-year history of smoking and currently smoke or have quit within the past 15 years. Yearly screening is stopped once you have quit smoking for at least 15 years or develop a health problem that would prevent you from having lung cancer  treatment.  Clinical breast exam.** / Every year after age 4 years.  BRCA-related cancer risk assessment.** / For women who have family members with a BRCA-related cancer (breast, ovarian, tubal, or peritoneal cancers).  Mammogram.** / Every year beginning at age 40 years and continuing for as long as you are in good health. Consult with your health care provider.  Pap test.** / Every 3 years starting at age 9 years through age 34 or 91 years with 3 consecutive normal Pap tests. Testing can be stopped between 65 and 70 years with 3 consecutive normal Pap tests and no abnormal Pap or HPV tests in the past 10 years.  HPV screening.** / Every 3 years from ages 57 years through ages 64 or 45 years with a history of 3 consecutive normal Pap tests. Testing can be stopped between 65 and 70 years with 3 consecutive normal Pap tests and no abnormal Pap or HPV tests in the past 10 years.  Fecal occult blood test (FOBT) of stool. / Every year beginning at age 15 years and continuing until age 17 years. You may not need to do this test if you get a colonoscopy every 10 years.  Flexible sigmoidoscopy or colonoscopy.** / Every 5 years for a flexible sigmoidoscopy or every 10 years for a colonoscopy beginning at age 86 years and continuing until age 71 years.  Hepatitis C blood test.** / For all people born from [birth year] through 1963 and any individual with known risks for hepatitis C.  Osteoporosis screening.** / A one-time screening for women ages 83 years and over and women at risk for fractures or osteoporosis.  Skin self-exam. / Monthly.  Influenza vaccine. / Every year.  Tetanus, diphtheria, and acellular pertussis (Tdap/Td) vaccine.** / 1 dose of Td every 10 years.  Varicella vaccine.** / Consult your health care provider.  Zoster vaccine.** / 1 dose for adults aged 61 years or older.  Pneumococcal 13-valent conjugate (PCV13) vaccine.** / Consult your health care provider.  Pneumococcal  polysaccharide (PPSV23) vaccine.** / 1 dose for all adults aged 28 years and older.  Meningococcal vaccine.** / Consult your health care provider.  Hepatitis A vaccine.** / Consult your health care provider.  Hepatitis B vaccine.** / Consult your health care provider.  Haemophilus influenzae type b (Hib) vaccine.** / Consult your health care provider. ** Family history and personal history of risk and conditions may change your health care provider's recommendations. Document Released: 03/04/2001 Document Revised: 05/23/2013 Document Reviewed: 06/03/2010 [facility] Patient Information 2015 [place], [place]. This information is not intended to replace advice given to you by your health care provider. Make sure you discuss any questions you have with your health care provider.

## 2013-12-01 LAB — CYTOLOGY - PAP

## 2013-12-02 ENCOUNTER — Encounter: Payer: Self-pay | Admitting: Obstetrics & Gynecology

## 2013-12-02 DIAGNOSIS — R87810 Cervical high risk human papillomavirus (HPV) DNA test positive: Secondary | ICD-10-CM

## 2013-12-02 DIAGNOSIS — R87610 Atypical squamous cells of undetermined significance on cytologic smear of cervix (ASC-US): Secondary | ICD-10-CM | POA: Insufficient documentation

## 2013-12-03 LAB — URINE CULTURE

## 2013-12-12 ENCOUNTER — Ambulatory Visit (HOSPITAL_COMMUNITY): Payer: Self-pay | Attending: Obstetrics & Gynecology

## 2013-12-13 ENCOUNTER — Telehealth: Payer: Self-pay

## 2013-12-13 NOTE — Telephone Encounter (Signed)
Per Dr. Harolyn Rutherford patient needs colpo for  ASCUS +HRHPV pap. Called patient and informed her of results and need for colpo. Informed her front office staff will call her with an appointment for January--advised that she call clinic to schedule appointment if she has not heard from clinic by mid December. Patient verbalized understanding and gratitude. No questions or concerns. Message sent to admin pool to schedule and call patient with appointment when January schedule opens.

## 2013-12-14 ENCOUNTER — Encounter: Payer: Self-pay | Admitting: Medical

## 2014-01-27 ENCOUNTER — Encounter: Payer: Self-pay | Admitting: Nurse Practitioner

## 2014-02-23 ENCOUNTER — Encounter: Payer: Self-pay | Admitting: Family Medicine

## 2014-02-23 ENCOUNTER — Other Ambulatory Visit (HOSPITAL_COMMUNITY)
Admission: RE | Admit: 2014-02-23 | Discharge: 2014-02-23 | Disposition: A | Discharge status: Home / Self Care | Payer: Medicaid Other | Source: Ambulatory Visit | Attending: Family Medicine | Admitting: Family Medicine

## 2014-02-23 ENCOUNTER — Ambulatory Visit (INDEPENDENT_AMBULATORY_CARE_PROVIDER_SITE_OTHER): Payer: Self-pay | Admitting: Family Medicine

## 2014-02-23 VITALS — BP 122/70 | HR 86 | Temp 98.2°F | Ht 71.0 in | Wt 157.4 lb

## 2014-02-23 DIAGNOSIS — R87610 Atypical squamous cells of undetermined significance on cytologic smear of cervix (ASC-US): Secondary | ICD-10-CM

## 2014-02-23 DIAGNOSIS — Z3042 Encounter for surveillance of injectable contraceptive: Secondary | ICD-10-CM

## 2014-02-23 DIAGNOSIS — Z01812 Encounter for preprocedural laboratory examination: Secondary | ICD-10-CM

## 2014-02-23 DIAGNOSIS — R87619 Unspecified abnormal cytological findings in specimens from cervix uteri: Secondary | ICD-10-CM | POA: Insufficient documentation

## 2014-02-23 LAB — POCT PREGNANCY, URINE: PREG TEST UR: NEGATIVE

## 2014-02-23 NOTE — Patient Instructions (Signed)
Colpocleisis, Care After Refer to this sheet in the next few weeks. These instructions provide you with information on caring for yourself after your procedure. Your health care provider may also give you more specific instructions. Your treatment has been planned according to current medical practices, but problems sometimes occur. Call your health care provider if you have any problems or questions after your procedure. WHAT TO EXPECT AFTER THE PROCEDURE  After your procedure, it is typical to have the following:  Vaginal discomfort that can be relieved by pain medicines.  Vaginal spotting. HOME CARE INSTRUCTIONS   Follow your health care provider's instructions regarding diet, rest, driving, and general activities.  Do not lift anything over 5 lb (2.3 kg) until your health care provider approves.  Only take over-the-counter or prescription medicines as directed by your health care provider.  Do not take aspirin. It can cause bleeding.  You may take a laxative as directed by your health care provider.  You may take warm sitz baths 2-3 times a day to reduce swelling and discomfort.  Fill the bathtub half full with warm water.  Sit in the water and open the drain a little.  Turn on the warm water to keep the tub half full. Keep the water running constantly.  Soak in the water for 15-20 minutes.  After the sitz bath, pat the affected area dry first.  Follow up with your health care provider as directed. SEEK MEDICAL CARE IF:   You have a fever.  You begin to bleed from the wound area.  You develop bloody or painful urination.  You develop abdominal pain.  You have increasing pain in the affected area.  You have redness or swelling in the affected area.  You have heavy drainage or pus coming from the affected area.  You develop a rash.  You think the stitches (sutures) in your wound are breaking.  You have nausea or diarrhea.  You become constipated. SEEK  IMMEDIATE MEDICAL CARE IF:   You develop shortness of breath.  You develop leg or chest pain.  You faint. Document Released: 10/27/2012 Document Reviewed: 10/27/2012 [facility] Patient Information 2015 [place], [place]. This information is not intended to replace advice given to you by your health care provider. Make sure you discuss any questions you have with your health care provider.

## 2014-02-23 NOTE — Progress Notes (Signed)
Patient ID: Mary Ramos, female   DOB: 01/18/1971, 44 y.o.   MRN: [id number]  Chief Complaint  Patient presents with  . Colposcopy    HPI Mary Ramos is a 44 y.o. female.  ASCUS on pap 11/11, +HRHPV detected  HPI  Indications: Pap smear on January 2015 showed: ASCUS with POSITIVE high risk HPV.  Prior cervical treatment: LEEP unsure when, last pap normal 2011.  No past medical history on file.  Past Surgical History  Procedure Laterality Date  . Leep      No family history on file.  Social History History  Substance Use Topics  . Smoking status: Current Every Day Smoker -- 0.50 packs/day    Types: Cigarettes  . Smokeless tobacco: Never Used  . Alcohol Use: No    No Known Allergies  Current Outpatient Prescriptions  Medication Sig Dispense Refill  . ciprofloxacin (CIPRO) 500 MG tablet Take 1 tablet (500 mg total) by mouth 2 (two) times daily. (Patient not taking: Reported on 02/23/2014) 14 tablet 0  . clobetasol ointment (TEMOVATE) 8.29 % Apply 1 application topically 2 (two) times daily. Apply to affected area (Patient not taking: Reported on 02/23/2014) 30 g 2  . ibuprofen (ADVIL,MOTRIN) 200 MG tablet Take 400 mg by mouth once.    . predniSONE (DELTASONE) 10 MG tablet 6 day step down dose (Patient not taking: Reported on 02/23/2014) 21 tablet 0   Current Facility-Administered Medications  Medication Dose Route Frequency Provider Last Rate Last Dose  . medroxyPROGESTERone (DEPO-PROVERA) injection 150 mg  150 mg Intramuscular Q90 days Osborne Oman, MD   150 mg at 11/30/13 1425    Review of Systems Review of Systems  There were no vitals taken for this visit.  Physical Exam Physical Exam  Constitutional: She appears well-developed and well-nourished. No distress.  HENT:  Mouth/Throat: Mucous membranes are moist. Pharynx is normal.  Eyes: Conjunctivae and EOM are normal.  Neck: No adenopathy.  Cardiovascular: Normal rate and S2 normal.   Pulmonary/Chest:  Effort normal.  Abdominal: She exhibits no distension. There is no tenderness.  Musculoskeletal: Normal range of motion.  Neurological: She is alert. No cranial nerve deficit. Coordination normal.  Skin: Skin is warm. No rash noted. She is not diaphoretic. No pallor.    Data Reviewed pap  Assessment    Procedure Details  The risks and benefits of the procedure and Written informed consent obtained.  Speculum placed in vagina and excellent visualization of cervix achieved, cervix swabbed x 3 with acetic acid solution. acetowhite changes noted at 7 o'clock, increased vascularity noted at 10 o'clock Specimens: 7 o'clock, 10 o'clock, ECC  Complications: none.     Plan    Specimens labelled and sent to Pathology. Return to discuss Pathology results in 2 weeks. Colpo visualized with Dr. 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  Merla Riches, MD

## 2014-02-23 NOTE — Progress Notes (Signed)
Patient ID: Mary Ramos, female   DOB: [birth year], 44 y.o.   MRN: [id number] Pt is the time frame for receiving Depo Provera 150 mg.

## 2014-02-27 ENCOUNTER — Telehealth: Payer: Self-pay | Admitting: *Deleted

## 2014-02-27 NOTE — Telephone Encounter (Signed)
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 Advised her to call in December for February appointment and not to wait longer than one year. [name] voices understanding, and did not have any questions.

## 2014-02-27 NOTE — Telephone Encounter (Signed)
-----   Message from Merla Riches, MD sent at 02/25/2014  3:40 PM EST ----- colpo biopsy => was abnormal but not abnormal enough to need another procedure such as LEEP (which pt has had in past).  She will however need a repeat pap in 1 year, please notify patient.  Marita [place]   ----- Message -----    From: Lab in [facility] Interface    Sent: 02/24/2014   1:07 PM      To: Merla Riches, MD

## 2014-03-17 ENCOUNTER — Encounter: Payer: Self-pay | Admitting: General Practice

## 2014-05-15 ENCOUNTER — Ambulatory Visit: Payer: Self-pay

## 2014-05-17 ENCOUNTER — Ambulatory Visit (INDEPENDENT_AMBULATORY_CARE_PROVIDER_SITE_OTHER): Payer: Self-pay | Admitting: *Deleted

## 2014-05-17 VITALS — BP 109/62 | HR 77 | Wt 163.2 lb

## 2014-05-17 DIAGNOSIS — N938 Other specified abnormal uterine and vaginal bleeding: Secondary | ICD-10-CM

## 2014-05-17 MED ORDER — MEDROXYPROGESTERONE ACETATE 150 MG/ML IM SUSP
150.0000 mg | Freq: Once | INTRAMUSCULAR | Status: AC
  Administered 2014-05-17: 150 mg via INTRAMUSCULAR

## 2014-08-02 ENCOUNTER — Ambulatory Visit (INDEPENDENT_AMBULATORY_CARE_PROVIDER_SITE_OTHER): Payer: Medicaid Other | Admitting: *Deleted

## 2014-08-02 DIAGNOSIS — Z3042 Encounter for surveillance of injectable contraceptive: Secondary | ICD-10-CM

## 2014-08-02 MED ORDER — MEDROXYPROGESTERONE ACETATE 150 MG/ML IM SUSP
150.0000 mg | Freq: Once | INTRAMUSCULAR | Status: AC
  Administered 2014-08-02: 150 mg via INTRAMUSCULAR

## 2014-11-09 ENCOUNTER — Ambulatory Visit: Payer: Self-pay

## 2014-12-06 ENCOUNTER — Ambulatory Visit (INDEPENDENT_AMBULATORY_CARE_PROVIDER_SITE_OTHER): Payer: Medicaid Other | Admitting: General Practice

## 2014-12-06 VITALS — BP 146/85 | HR 71 | Temp 97.8°F | Ht 71.0 in | Wt 166.0 lb

## 2014-12-06 DIAGNOSIS — Z3042 Encounter for surveillance of injectable contraceptive: Secondary | ICD-10-CM | POA: Diagnosis present

## 2014-12-06 LAB — POCT PREGNANCY, URINE: PREG TEST UR: NEGATIVE

## 2014-12-06 NOTE — Progress Notes (Signed)
Patient here today for depo. Last depo dose was 18weeks ago. upt negative. Patient reports last intercourse was months ago. Will give depo

## 2015-01-24 ENCOUNTER — Ambulatory Visit: Payer: Medicaid Other

## 2015-02-26 ENCOUNTER — Ambulatory Visit: Payer: Medicaid Other

## 2015-03-01 ENCOUNTER — Ambulatory Visit (INDEPENDENT_AMBULATORY_CARE_PROVIDER_SITE_OTHER): Payer: Medicaid Other

## 2015-03-01 VITALS — BP 141/74 | HR 75 | Temp 98.4°F | Wt 167.0 lb

## 2015-03-01 DIAGNOSIS — Z3042 Encounter for surveillance of injectable contraceptive: Secondary | ICD-10-CM

## 2015-05-24 ENCOUNTER — Ambulatory Visit (INDEPENDENT_AMBULATORY_CARE_PROVIDER_SITE_OTHER): Payer: Medicaid Other | Admitting: *Deleted

## 2015-05-24 VITALS — BP 136/74 | HR 60 | Wt 164.3 lb

## 2015-05-24 DIAGNOSIS — Z3042 Encounter for surveillance of injectable contraceptive: Secondary | ICD-10-CM

## 2015-08-09 ENCOUNTER — Ambulatory Visit: Payer: Medicaid Other

## 2015-08-10 ENCOUNTER — Ambulatory Visit (INDEPENDENT_AMBULATORY_CARE_PROVIDER_SITE_OTHER): Payer: Medicaid Other

## 2015-08-10 VITALS — BP 125/82 | HR 69

## 2015-08-10 DIAGNOSIS — Z3042 Encounter for surveillance of injectable contraceptive: Secondary | ICD-10-CM

## 2015-08-10 NOTE — Progress Notes (Signed)
Patient presented today for her depo-provera injection. Patient tolerated well and will follow up in three months.

## 2015-08-30 ENCOUNTER — Emergency Department (HOSPITAL_COMMUNITY)
Admission: EM | Admit: 2015-08-30 | Discharge: 2015-08-30 | Disposition: A | Discharge status: Home / Self Care | Payer: Medicaid Other | Attending: Emergency Medicine | Admitting: Emergency Medicine

## 2015-08-30 ENCOUNTER — Encounter (HOSPITAL_COMMUNITY): Payer: Self-pay | Admitting: Emergency Medicine

## 2015-08-30 DIAGNOSIS — M79641 Pain in right hand: Secondary | ICD-10-CM | POA: Diagnosis not present

## 2015-08-30 DIAGNOSIS — Z79899 Other long term (current) drug therapy: Secondary | ICD-10-CM | POA: Diagnosis not present

## 2015-08-30 DIAGNOSIS — R079 Chest pain, unspecified: Secondary | ICD-10-CM

## 2015-08-30 DIAGNOSIS — Z791 Long term (current) use of non-steroidal anti-inflammatories (NSAID): Secondary | ICD-10-CM | POA: Diagnosis not present

## 2015-08-30 DIAGNOSIS — M79643 Pain in unspecified hand: Secondary | ICD-10-CM

## 2015-08-30 DIAGNOSIS — F17210 Nicotine dependence, cigarettes, uncomplicated: Secondary | ICD-10-CM | POA: Insufficient documentation

## 2015-08-30 DIAGNOSIS — M546 Pain in thoracic spine: Secondary | ICD-10-CM

## 2015-08-30 DIAGNOSIS — M79642 Pain in left hand: Secondary | ICD-10-CM | POA: Insufficient documentation

## 2015-08-30 MED ORDER — IBUPROFEN 200 MG PO TABS: 400.0000 mg | ORAL_TABLET | Freq: Three times a day (TID) | ORAL | 0 refills | Status: DC | PRN

## 2015-08-30 MED ORDER — CYCLOBENZAPRINE HCL 10 MG PO TABS: 10.0000 mg | ORAL_TABLET | Freq: Two times a day (BID) | ORAL | 0 refills | Status: DC | PRN
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
## 2015-09-05 ENCOUNTER — Ambulatory Visit: Payer: Medicaid Other | Admitting: Medical

## 2015-10-19 ENCOUNTER — Ambulatory Visit (INDEPENDENT_AMBULATORY_CARE_PROVIDER_SITE_OTHER): Payer: Medicaid Other | Admitting: *Deleted

## 2015-10-19 DIAGNOSIS — Z3042 Encounter for surveillance of injectable contraceptive: Secondary | ICD-10-CM

## 2015-12-06 ENCOUNTER — Other Ambulatory Visit: Payer: Self-pay | Admitting: Family Medicine

## 2015-12-06 ENCOUNTER — Other Ambulatory Visit (HOSPITAL_COMMUNITY)
Admission: RE | Admit: 2015-12-06 | Discharge: 2015-12-06 | Disposition: A | Discharge status: Home / Self Care | Payer: Medicaid Other | Source: Ambulatory Visit | Attending: Family Medicine | Admitting: Family Medicine

## 2015-12-06 DIAGNOSIS — Z113 Encounter for screening for infections with a predominantly sexual mode of transmission: Secondary | ICD-10-CM | POA: Insufficient documentation

## 2015-12-10 LAB — URINE CYTOLOGY ANCILLARY ONLY
Candida vaginitis: NEGATIVE
Chlamydia: NEGATIVE
Neisseria Gonorrhea: NEGATIVE
TRICH (WINDOWPATH): POSITIVE — AB

## 2015-12-20 ENCOUNTER — Ambulatory Visit (INDEPENDENT_AMBULATORY_CARE_PROVIDER_SITE_OTHER): Payer: Medicaid Other | Admitting: Diagnostic Neuroimaging

## 2015-12-20 ENCOUNTER — Encounter (INDEPENDENT_AMBULATORY_CARE_PROVIDER_SITE_OTHER): Payer: Self-pay | Admitting: Diagnostic Neuroimaging

## 2015-12-20 DIAGNOSIS — R200 Anesthesia of skin: Secondary | ICD-10-CM | POA: Diagnosis not present

## 2015-12-20 DIAGNOSIS — Z0289 Encounter for other administrative examinations: Secondary | ICD-10-CM

## 2015-12-20 DIAGNOSIS — R202 Paresthesia of skin: Secondary | ICD-10-CM

## 2015-12-20 DIAGNOSIS — R29898 Other symptoms and signs involving the musculoskeletal system: Secondary | ICD-10-CM

## 2015-12-28 NOTE — Procedures (Signed)
[facility]  NCS (NERVE CONDUCTION STUDY) WITH EMG (ELECTROMYOGRAPHY) REPORT   STUDY DATE: 12/20/15 PATIENT NAME: Mary Ramos DOB: [birth year] MRN: [id number]  ORDERING CLINICIAN: Clayburn Pert  TECHNOLOGIST: Oneita Jolly ELECTROMYOGRAPHER: Earlean Polka. Pranay Hilbun, MD  CLINICAL INFORMATION: 45 year old female with right hand pain and numbness. Evaluate for carpal tunnel syndrome.  FINDINGS: NERVE CONDUCTION STUDY: Right median motor response has prolonged distal latency, normal amplitude, normal conduction velocity and normal F-wave latency.   Left median and right ulnar motor responses are normal.  Right median sensory response could not be obtained. Right median to ulnar transcarpal comparison testing shows peak latency difference of 2 ms, which is prolonged.  Right ulnar sensory response is normal. Left ulnar sensory response is normal.  Left median sensory response has prolonged peak latency and normal amplitude.  Left median to ulnar transcarpal comparison testing shows peak latency difference of 0.5 ms, which is prolonged.   NEEDLE ELECTROMYOGRAPHY: Needle examination of selected muscles of right upper extremity: Deltoid, biceps, flexor carpi radialis, first dorsal interosseous - normal Triceps, brachioradialis, extensor indicis proprius - no abnormal spontaneous activity at rest and decreased motor unit recruitment on exertion  Right C5-6 paraspinal muscles are normal.   Right C7-T1 paraspinal muscles have increased insertional activity and borderline fibrillation potentials.   IMPRESSION:  Abnormal study demonstrating: 1. Bilateral median neuropathies at the wrist consistent with bilateral carpal tunnel syndrome. 2. 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    INTERPRETING PHYSICIAN:  Penni Bombard, MD Certified in Neurology, Neurophysiology and Neuroimaging  [facility] [address] [place], [place] [address] [phone]   [facility]    Nerve / Sites Rec. Site Peak Lat Ref.  Amp Ref. Segments Distance Peak Diff Ref.    ms ms V V  cm ms ms  R Median - Orthodromic (Dig II, Mid palm)     Dig II Wrist NR ?3.4 NR ?10 Dig II - Wrist 13    R Median, Ulnar - Transcarpal comparison     Median Palm Wrist 4.0 ?2.2 6 ?50 Median Palm - Wrist 8       Ulnar Palm Wrist 2.0 ?2.2 23 ?12 Ulnar Palm - Wrist 8          Median Palm - Ulnar Palm  2.0 ?0.4  R Ulnar - Orthodromic, (Dig V, Mid palm)     Dig V Wrist 2.6 ?3.1 16 ?5 Dig V - Wrist 11    L Median - Orthodromic (Dig II, Mid palm)     Dig II Wrist 3.7 ?3.4 10 ?10 Dig II - Wrist 13    L Median, Ulnar - Transcarpal comparison     Median Palm Wrist 2.6 ?2.2 27 ?50 Median Palm - Wrist 8       Ulnar Palm Wrist 2.0 ?2.2 17 ?12 Ulnar Palm - Wrist 8          Median Palm - Ulnar Palm  0.5 ?0.4  L Ulnar - Orthodromic, (Dig V, Mid palm)     Dig V Wrist 2.7 ?3.1 9 ?5 Dig V - Wrist 11                   [facility]    Nerve / Sites Muscle Latency Ref. Amplitude Ref. Rel Amp Segments Distance Lat Diff Velocity Ref. Area    ms ms mV mV %  cm ms m/s m/s mVms  R Median - APB  Wrist APB 5.3 ?4.4 6.9 ?4.0 100 Wrist - APB 7    21.5     Upper arm APB 9.8  7.8  114 Upper arm - Wrist 24 4.6 52  25.4  R Ulnar - ADM     Wrist ADM 2.7 ?3.3 10.4 ?6.0 100 Wrist - ADM 7    35.3     B.Elbow ADM 5.9  9.7  92.7 B.Elbow - Wrist 18 3.2 57 ?49 34.7     A.Elbow ADM 8.1  9.1  94.2 A.Elbow - B.Elbow 12 2.2 54 ?49 33.5         A.Elbow - Wrist  5.4     L Median - APB     Wrist APB 4.1 ?4.4 11.0 ?4.0 100 Wrist - APB 7    38.4     Upper arm APB 9.1  9.9  89.9 Upper arm - Wrist 28 5.0 56  36.6           F  Wave    Nerve F Lat Ref.   ms ms  R Median - APB 25.1 ?31.0  R Ulnar - ADM 29.6 ?32.0  L Median - APB 24.8 ?31.0

## 2016-01-04 ENCOUNTER — Ambulatory Visit (INDEPENDENT_AMBULATORY_CARE_PROVIDER_SITE_OTHER): Payer: Medicaid Other | Admitting: *Deleted

## 2016-01-04 DIAGNOSIS — Z3042 Encounter for surveillance of injectable contraceptive: Secondary | ICD-10-CM

## 2016-03-27 ENCOUNTER — Ambulatory Visit: Payer: Medicaid Other | Admitting: Medical

## 2016-07-01 DIAGNOSIS — F17200 Nicotine dependence, unspecified, uncomplicated: Secondary | ICD-10-CM | POA: Insufficient documentation

## 2016-07-01 DIAGNOSIS — I10 Essential (primary) hypertension: Secondary | ICD-10-CM | POA: Insufficient documentation

## 2016-09-25 ENCOUNTER — Encounter (HOSPITAL_COMMUNITY): Payer: Self-pay | Admitting: Emergency Medicine

## 2016-09-25 ENCOUNTER — Emergency Department (HOSPITAL_COMMUNITY): Payer: Medicaid Other

## 2016-09-25 ENCOUNTER — Emergency Department (HOSPITAL_COMMUNITY)
Admission: EM | Admit: 2016-09-25 | Discharge: 2016-09-25 | Disposition: A | Discharge status: Home / Self Care | Payer: Medicaid Other | Attending: Emergency Medicine | Admitting: Emergency Medicine

## 2016-09-25 DIAGNOSIS — M5412 Radiculopathy, cervical region: Secondary | ICD-10-CM | POA: Insufficient documentation

## 2016-09-25 DIAGNOSIS — Z7982 Long term (current) use of aspirin: Secondary | ICD-10-CM | POA: Insufficient documentation

## 2016-09-25 DIAGNOSIS — Z87891 Personal history of nicotine dependence: Secondary | ICD-10-CM | POA: Diagnosis not present

## 2016-09-25 DIAGNOSIS — R079 Chest pain, unspecified: Secondary | ICD-10-CM

## 2016-09-25 DIAGNOSIS — Z79899 Other long term (current) drug therapy: Secondary | ICD-10-CM | POA: Diagnosis not present

## 2016-09-25 LAB — CBC
HCT: 39.5 % (ref 36.0–46.0)
Hemoglobin: 13.3 g/dL (ref 12.0–15.0)
MCH: 30 pg (ref 26.0–34.0)
MCHC: 33.7 g/dL (ref 30.0–36.0)
MCV: 89.2 fL (ref 78.0–100.0)
PLATELETS: 249 10*3/uL (ref 150–400)
RBC: 4.43 MIL/uL (ref 3.87–5.11)
RDW: 14.1 % (ref 11.5–15.5)
WBC: 6 10*3/uL (ref 4.0–10.5)

## 2016-09-25 LAB — BASIC METABOLIC PANEL
Anion gap: 9 (ref 5–15)
BUN: 22 mg/dL — ABNORMAL HIGH (ref 6–20)
CALCIUM: 9.9 mg/dL (ref 8.9–10.3)
CO2: 22 mmol/L (ref 22–32)
CREATININE: 0.96 mg/dL (ref 0.44–1.00)
Chloride: 106 mmol/L (ref 101–111)
GFR calc Af Amer: >60 mL/min (ref >60)
Glucose, Bld: 94 mg/dL (ref 65–99)
Potassium: 3.6 mmol/L (ref 3.5–5.1)
Sodium: 137 mmol/L (ref 135–145)

## 2016-09-25 LAB — TROPONIN I

## 2016-09-25 IMAGING — DX DG CHEST 2V
2 series · 2 of 2 positions shown · non-contrast
Comparison: [DATE]

CLINICAL DATA: Right-sided chest pain for 4 days. Shortness of
breath.

EXAM:
CHEST  2 VIEW

[chest pa]
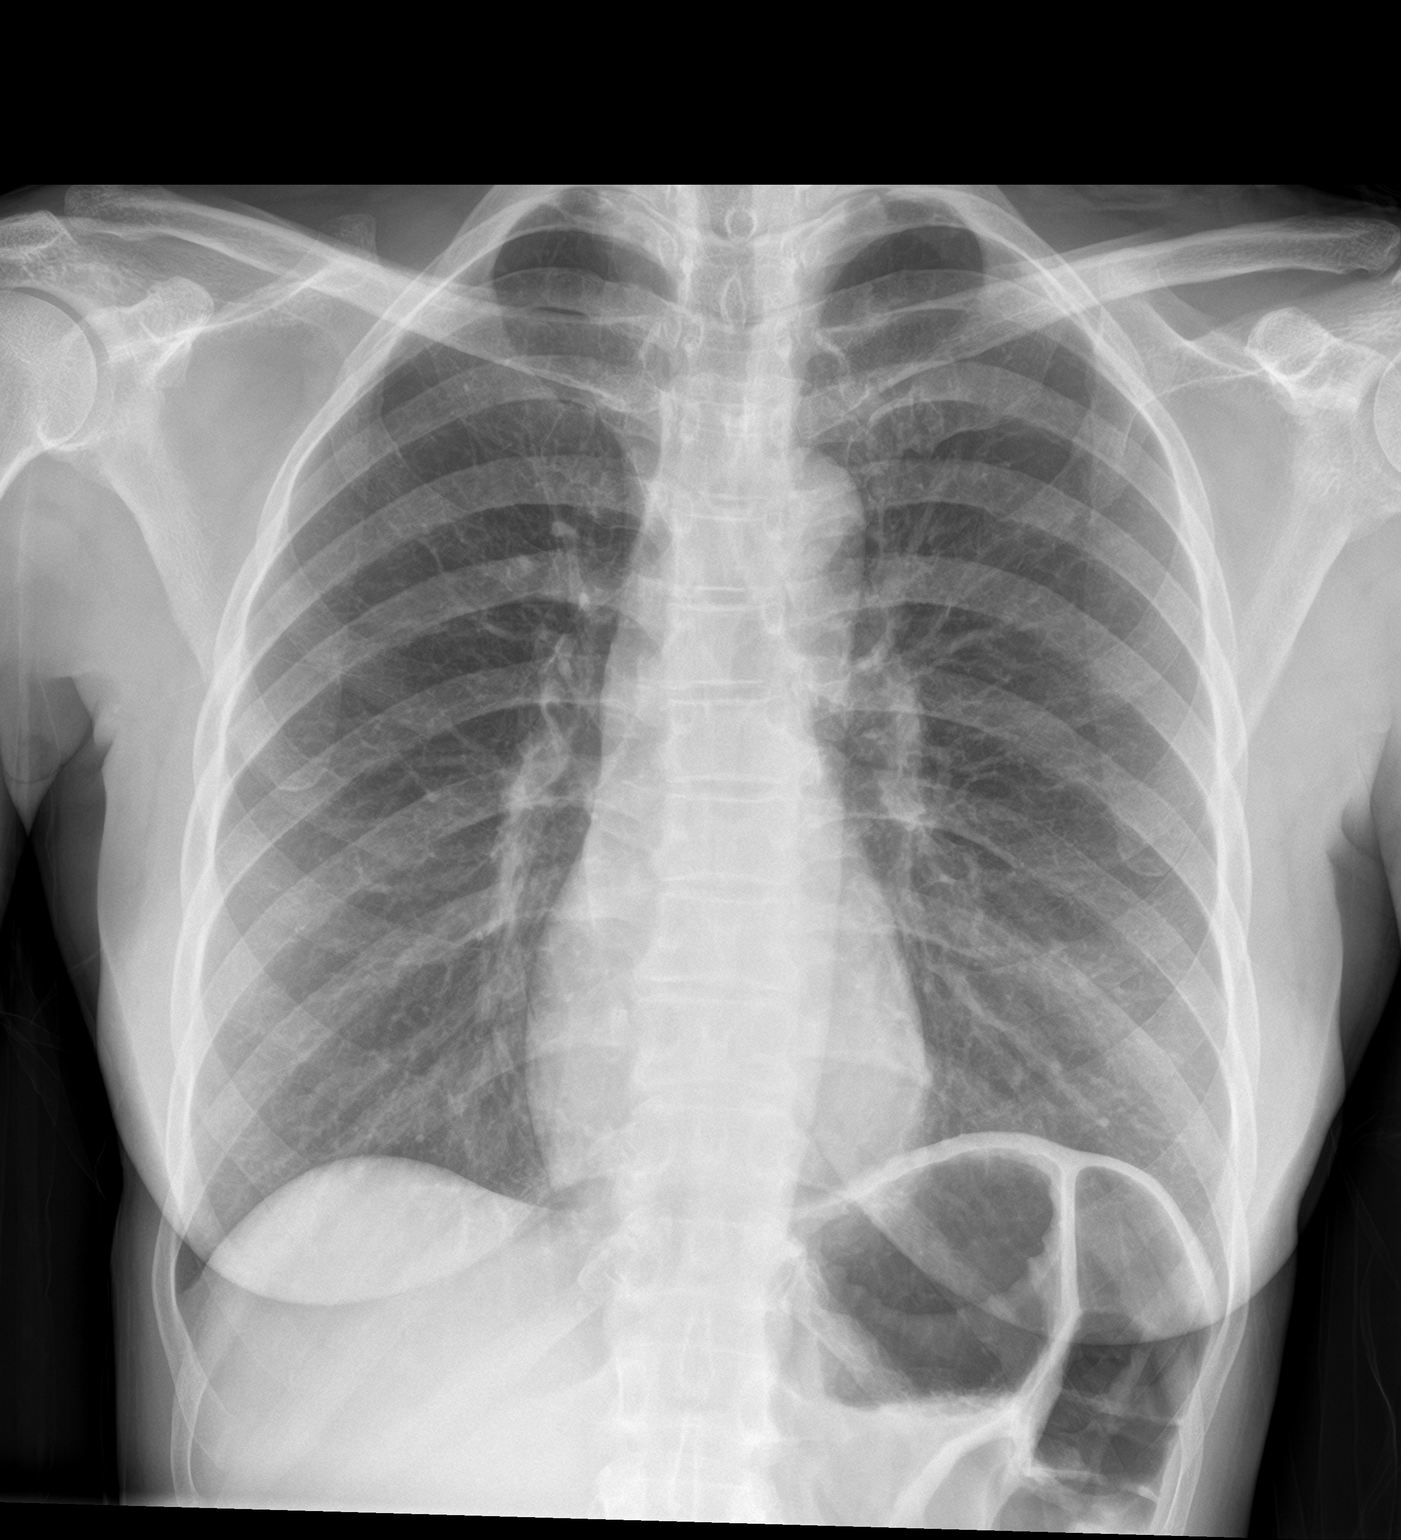

[chest lat]
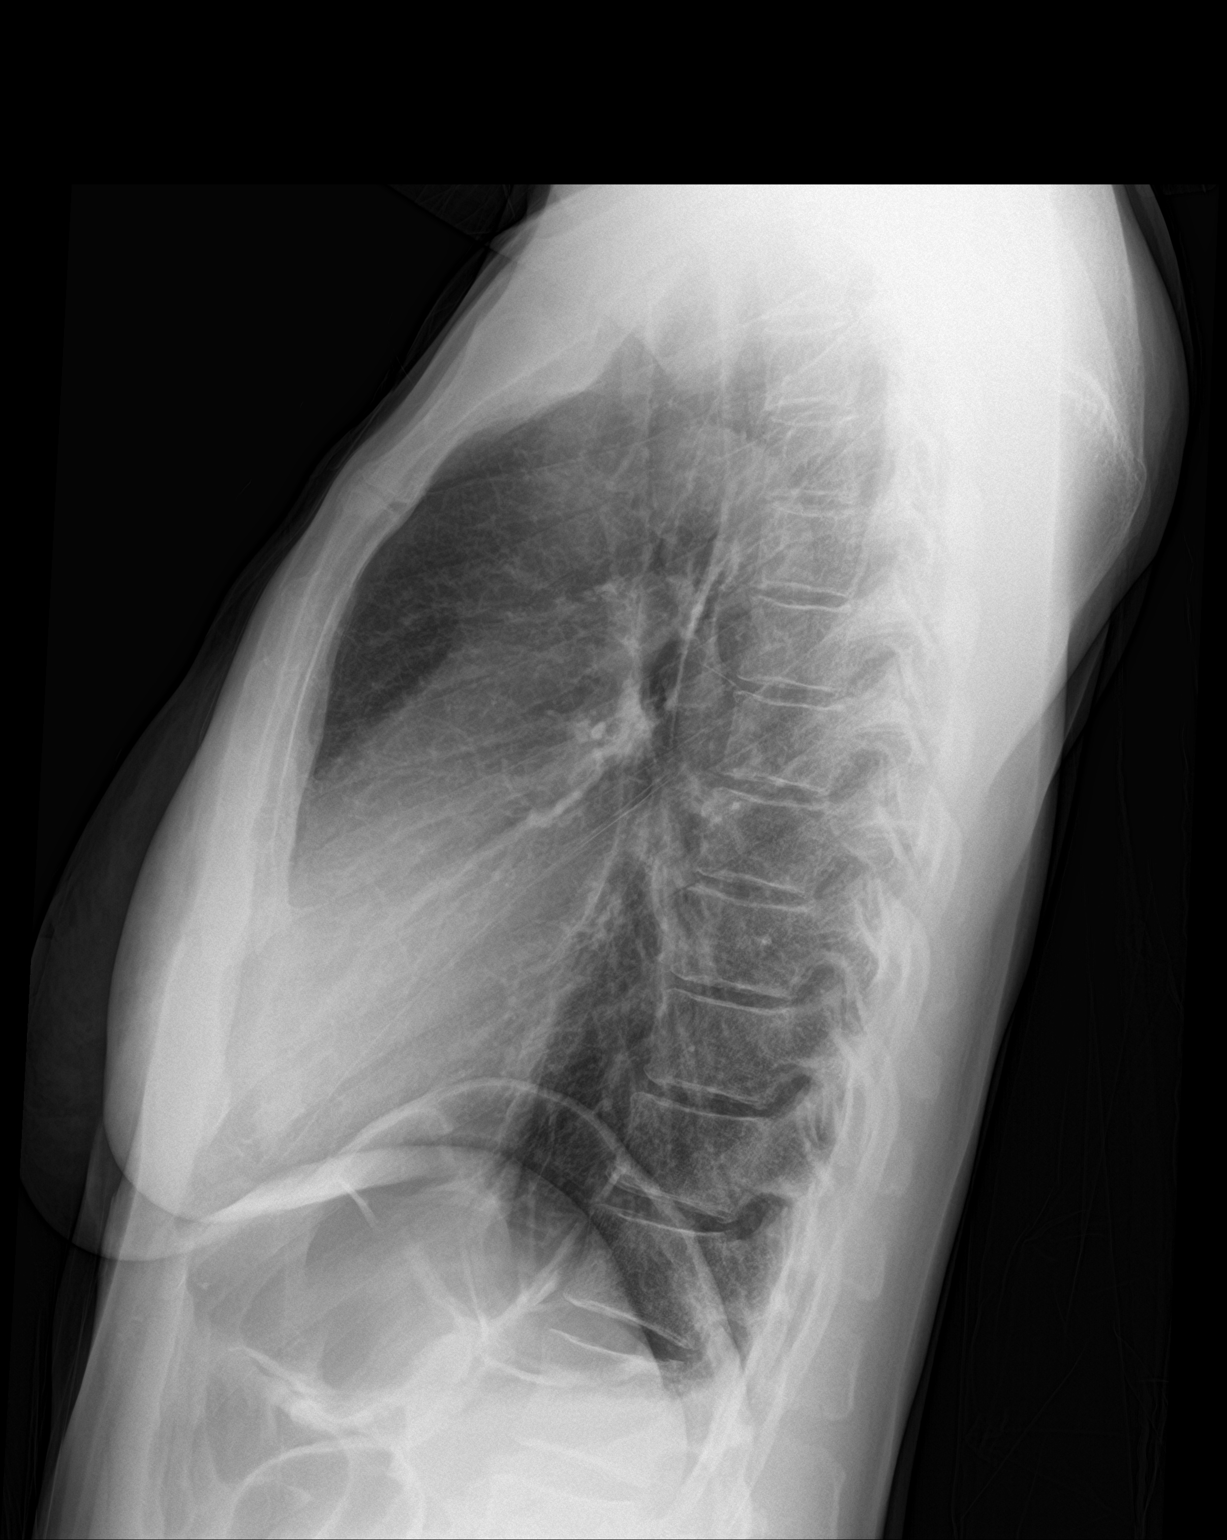

[2 of 2 positions shown; findings below may reference images not displayed]

FINDINGS: The lungs are clear without focal pneumonia, edema, pneumothorax or
pleural effusion. The cardiopericardial silhouette is within normal
limits for size. The visualized bony structures of the thorax are
intact.
IMPRESSION: No active cardiopulmonary disease.

## 2016-09-25 NOTE — ED Triage Notes (Signed)
Pt c/o right sided chest pain that radiates to the shoulder x 4 days. Pt describes pain as burning.

## 2016-09-25 NOTE — Discharge Instructions (Signed)
For the pain in your chest, use Tylenol every 4 hours, and use a heating pad on the sore areas several times each day.  For the burning pain with eating, try taking Maalox before eating, 3 or 4 times a day.  Follow-up with your doctor for checkup as soon as possible.  Follow-up with a chiropractor as previously recommended for the pinched nerve in your neck.

## 2016-09-25 NOTE — ED Provider Notes (Signed)
[facility] DEPT Provider Note   CSN: [id number] Arrival date & time: 09/25/16  1937     History   Chief Complaint Chief Complaint  Patient presents with  . Chest Pain    HPI Mary Ramos is a 46 y.o. female.   The patient presents for evaluation of recurrent chest pain, previously diagnosed as from cervical radiculopathy.  This was apparently done through EMG testing.  For the last several days she has been bothered by right anterior chest wall tenderness, movement related pain, and concern for worsening condition.  She denies fever, chills, nausea, vomiting, diaphoresis, shortness of breath, back pain, weakness or paresthesia.  She is using her usual medications, without relief.  There are no other known modifying factors.  HPI  History reviewed. No pertinent past medical history.  Patient Active Problem List   Diagnosis Date Noted  . ASCUS with positive high risk HPV cervical pap smear 12/02/2013    Past Surgical History:  Procedure Laterality Date  . LEEP      OB History    Gravida Para Term Preterm AB Living   3 2 2  0 1 2   SAB TAB Ectopic Multiple Live Births   0 0 0 0         Home Medications    Prior to Admission medications   Medication Sig Start Date End Date Taking? Authorizing Provider  aspirin EC 81 MG tablet Take 81 mg by mouth daily.   Yes [provider]  cetirizine (ZYRTEC) 10 MG tablet Take 10 mg by mouth daily. 02/18/16  Yes [provider]  ciprofloxacin-dexamethasone (CIPRODEX) OTIC suspension Place 4 drops into the right ear 2 (two) times daily.   Yes [provider]  hydrochlorothiazide (HYDRODIURIL) 25 MG tablet Take 25 mg by mouth daily. 07/01/16  Yes [provider]  medroxyPROGESTERone (DEPO-PROVERA) 400 MG/ML SUSP injection Inject 400 mg into the muscle once.   Yes [provider]  triamcinolone cream (KENALOG) 0.1 % Apply 1 application topically 2 (two) times daily as needed. 02/18/16  02/17/17 Yes [provider]  cyclobenzaprine (FLEXERIL) 10 MG tablet Take 1 tablet (10 mg total) by mouth 2 (two) times daily as needed for muscle spasms. Patient not taking: Reported on 09/25/2016 08/30/15   Mesner, Corene Cornea, MD    Family History History reviewed. No pertinent family history.  Social History Social History  Substance Use Topics  . Smoking status: Former Smoker    Packs/day: 0.50    Types: Cigarettes  . Smokeless tobacco: Never Used  . Alcohol use No     Allergies   Patient has no known allergies.   Review of Systems Review of Systems  All other systems reviewed and are negative.    Physical Exam Updated Vital Signs BP 133/89   Pulse (!) 56   Temp 98.6 F (37 C) (Oral)   Resp 15   Ht 5\' 11"  (1.803 m)   Wt 69.9 kg (154 lb)   SpO2 99%   BMI 21.48 kg/m   Physical Exam  Constitutional: She is oriented to person, place, and time. She appears well-developed and well-nourished. No distress.  HENT:  Head: Normocephalic and atraumatic.  Eyes: Pupils are equal, round, and reactive to light. Conjunctivae and EOM are normal.  Neck: Normal range of motion and phonation normal. Neck supple.  Cardiovascular: Normal rate and regular rhythm.   Pulmonary/Chest: Effort normal and breath sounds normal. She exhibits tenderness (Right upper anterior chest wall tenderness without crepitation or  deformity.).  Abdominal: Soft. She exhibits no distension. There is no tenderness. There is no guarding.  Musculoskeletal: Normal range of motion.  Neurological: She is alert and oriented to person, place, and time. She exhibits normal muscle tone.  Skin: Skin is warm and dry.  Psychiatric: She has a normal mood and affect. Her behavior is normal. Judgment and thought content normal.  Nursing note and vitals reviewed.    ED Treatments / Results  Labs (all labs ordered are listed, but only abnormal results are displayed) Labs Reviewed  BASIC METABOLIC PANEL -  Abnormal; Notable for the following:       Result Value   BUN 22 (*)    All other components within normal limits  CBC  TROPONIN I    EKG  EKG Interpretation  Date/Time:  Thursday September 25 2016 19:46:47 EDT Ventricular Rate:  78 PR Interval:  154 QRS Duration: 82 QT Interval:  362 QTC Calculation: 412 R Axis:   21 Text Interpretation:  Normal sinus rhythm Biatrial enlargement Abnormal ECG Since last tracing bilateral atrial enlargement is new Confirmed by Daleen Bo [phone]) on 09/25/2016 10:43:38 PM       Radiology Dg Chest 2 View  Result Date: 09/25/2016 CLINICAL DATA:  Right-sided chest pain for 4 days. Shortness of breath. EXAM: CHEST  2 VIEW COMPARISON:  08/09/2013 FINDINGS: The lungs are clear without focal pneumonia, edema, pneumothorax or pleural effusion. The cardiopericardial silhouette is within normal limits for size. The visualized bony structures of the thorax are intact. IMPRESSION: No active cardiopulmonary disease. Electronically Signed   By: Misty Stanley M.D.   On: 09/25/2016 20:06    Procedures Procedures (including critical care time)  Medications Ordered in ED Medications - No data to display   Initial Impression / Assessment and Plan / ED Course  I have reviewed the triage vital signs and the nursing notes.  Pertinent labs & imaging results that were available during my care of the patient were reviewed by me and considered in my medical decision making (see chart for details).      Patient Vitals for the past 24 hrs:  BP Temp Temp src Pulse Resp SpO2 Height Weight  09/25/16 2220 133/89 98.6 F (37 C) Oral (!) 56 15 99 % - -  09/25/16 2131 124/77 - - (!) 49 14 100 % - -  09/25/16 1945 125/83 98.7 F (37.1 C) Oral 71 18 99 % 5\' 11"  (1.803 m) 69.9 kg (154 lb)    At discharge- reevaluation with update and discussion. After initial assessment and treatment, an updated evaluation reveals she remains comfortable and has no further complaints.  Anacaren Kohan L      Final Clinical Impressions(s) / ED Diagnoses   Final diagnoses:  Chest pain, unspecified type  Cervical radiculopathy    The evaluation is consistent with chest wall pain, associated with cervical radiculopathy, which was previously known.  Doubt ACS, PE or pneumonia.  Possible new biatrial enlargement based on EKG criteria.  No indication for further evaluation of this at this time in the emergency or hospital setting.  Nursing Notes Reviewed/ Care Coordinated Applicable Imaging Reviewed Interpretation of Laboratory Data incorporated into ED treatment  The patient appears reasonably screened and/or stabilized for discharge and I doubt any other medical condition or other [facility] requiring further screening, evaluation, or treatment in the ED at this time prior to discharge.  Plan: Home Medications-continue usual medications; Home Treatments-heat to affected area, rest; return here if the recommended treatment,  does not improve the symptoms; Recommended follow up-PCP checkup 1 week.   New Prescriptions Discharge Medication List as of 09/25/2016 10:56 PM       Daleen Bo, MD 09/26/16 1115

## 2016-11-04 DIAGNOSIS — M47812 Spondylosis without myelopathy or radiculopathy, cervical region: Secondary | ICD-10-CM | POA: Insufficient documentation

## 2016-11-26 ENCOUNTER — Other Ambulatory Visit: Payer: Self-pay | Admitting: Nurse Practitioner

## 2016-11-26 DIAGNOSIS — Z1231 Encounter for screening mammogram for malignant neoplasm of breast: Secondary | ICD-10-CM

## 2016-12-24 ENCOUNTER — Ambulatory Visit: Payer: Medicaid Other

## 2017-02-11 DIAGNOSIS — M4802 Spinal stenosis, cervical region: Secondary | ICD-10-CM | POA: Insufficient documentation

## 2017-11-23 DIAGNOSIS — N200 Calculus of kidney: Secondary | ICD-10-CM | POA: Insufficient documentation

## 2017-11-23 DIAGNOSIS — F321 Major depressive disorder, single episode, moderate: Secondary | ICD-10-CM | POA: Insufficient documentation

## 2018-03-15 ENCOUNTER — Other Ambulatory Visit: Payer: Self-pay | Admitting: Family Medicine

## 2018-03-15 DIAGNOSIS — Z1231 Encounter for screening mammogram for malignant neoplasm of breast: Secondary | ICD-10-CM

## 2018-04-15 ENCOUNTER — Ambulatory Visit: Payer: Medicaid Other

## 2019-04-02 ENCOUNTER — Ambulatory Visit: Payer: Medicaid Other | Attending: Internal Medicine

## 2019-04-02 DIAGNOSIS — Z23 Encounter for immunization: Secondary | ICD-10-CM

## 2019-04-02 NOTE — Progress Notes (Signed)
   Covid-19 Vaccination Clinic  Name:  Mary Ramos    MRN: [id number] DOB: 1970-05-27  04/02/2019  Mary Ramos was observed post Covid-19 immunization for 15 minutes without incident. She was provided with Vaccine Information Sheet and instruction to access the V-Safe system.   Mary Ramos was instructed to call 911 with any severe reactions post vaccine: Marland Kitchen Difficulty breathing  . Swelling of face and throat  . A fast heartbeat  . A bad rash all over body  . Dizziness and weakness   Immunizations Administered    Name Date Dose VIS Date Route   Moderna COVID-19 Vaccine 04/02/2019 10:56 AM 0.5 mL 12/21/2018 Intramuscular   Manufacturer: Moderna   Lot: [id number]   [place]inconBE:[id number]

## 2019-05-04 ENCOUNTER — Ambulatory Visit: Payer: Medicaid Other

## 2019-06-07 DIAGNOSIS — N801 Endometriosis of ovary: Secondary | ICD-10-CM | POA: Diagnosis present

## 2019-06-07 DIAGNOSIS — Z3042 Encounter for surveillance of injectable contraceptive: Secondary | ICD-10-CM | POA: Insufficient documentation

## 2019-06-07 DIAGNOSIS — N80129 Deep endometriosis of ovary, unspecified ovary: Secondary | ICD-10-CM | POA: Diagnosis present

## 2019-10-10 DIAGNOSIS — Z309 Encounter for contraceptive management, unspecified: Secondary | ICD-10-CM | POA: Diagnosis not present

## 2019-10-15 ENCOUNTER — Encounter (HOSPITAL_COMMUNITY): Payer: Self-pay | Admitting: Emergency Medicine

## 2019-10-15 ENCOUNTER — Other Ambulatory Visit: Payer: Self-pay

## 2019-10-15 ENCOUNTER — Emergency Department (HOSPITAL_COMMUNITY)
Admission: EM | Admit: 2019-10-15 | Discharge: 2019-10-15 | Disposition: A | Discharge status: Home / Self Care | Payer: Medicaid Other | Attending: Emergency Medicine | Admitting: Emergency Medicine

## 2019-10-15 DIAGNOSIS — Z7982 Long term (current) use of aspirin: Secondary | ICD-10-CM | POA: Diagnosis not present

## 2019-10-15 DIAGNOSIS — Z20822 Contact with and (suspected) exposure to covid-19: Secondary | ICD-10-CM | POA: Insufficient documentation

## 2019-10-15 DIAGNOSIS — J020 Streptococcal pharyngitis: Secondary | ICD-10-CM | POA: Diagnosis not present

## 2019-10-15 DIAGNOSIS — F17210 Nicotine dependence, cigarettes, uncomplicated: Secondary | ICD-10-CM | POA: Insufficient documentation

## 2019-10-15 DIAGNOSIS — I10 Essential (primary) hypertension: Secondary | ICD-10-CM | POA: Diagnosis not present

## 2019-10-15 DIAGNOSIS — Z79899 Other long term (current) drug therapy: Secondary | ICD-10-CM | POA: Insufficient documentation

## 2019-10-15 DIAGNOSIS — J029 Acute pharyngitis, unspecified: Secondary | ICD-10-CM | POA: Diagnosis present

## 2019-10-15 HISTORY — DX: Essential (primary) hypertension: I10

## 2019-10-15 LAB — RESPIRATORY PANEL BY RT PCR (FLU A&B, COVID)
Influenza A by PCR: NEGATIVE
Influenza B by PCR: NEGATIVE
SARS Coronavirus 2 by RT PCR: NEGATIVE

## 2019-10-15 LAB — GROUP A STREP BY PCR: Group A Strep by PCR: DETECTED — AB

## 2019-10-15 MED ORDER — HYDROCODONE-ACETAMINOPHEN 5-325 MG PO TABS: 2.0000 | ORAL_TABLET | ORAL | 0 refills | Status: DC | PRN

## 2019-10-15 MED ORDER — PREDNISONE 50 MG PO TABS
60.0000 mg | ORAL_TABLET | Freq: Once | ORAL | Status: AC
  Administered 2019-10-15: 60 mg via ORAL
  Filled 2019-10-15: qty 1

## 2019-10-15 MED ORDER — PENICILLIN G BENZATHINE 1200000 UNIT/2ML IM SUSP
1.2000 10*6.[IU] | Freq: Once | INTRAMUSCULAR | Status: AC
  Administered 2019-10-15: 1.2 10*6.[IU] via INTRAMUSCULAR
  Filled 2019-10-15: qty 2

## 2019-10-15 NOTE — ED Provider Notes (Signed)
[facility] EMERGENCY DEPARTMENT Provider Note   CSN: [id number] Arrival date & time: 10/15/19  1742     History Chief Complaint  Patient presents with   Otalgia   Sore Throat    Mary Ramos is a 49 y.o. female.  HPI Patient presents with sore throat.  Has had for around 3 days.  Started with pain in her right ear but then localized throat.  Hurts with swallowing.  Has had some chills.  Somewhat decreased oral intake.  Has not been around anyone sick.  No cough.  States there is a fullness on the right ear also.    Past Medical History:  Diagnosis Date   Hypertension     Patient Active Problem List   Diagnosis Date Noted   Current moderate episode of major depressive disorder without prior episode ([place]) 11/23/2017   Degenerative disc disease, cervical 11/04/2016   Essential hypertension 07/01/2016   Tobacco dependence 07/01/2016   ASCUS with positive high risk HPV cervical pap smear 12/02/2013    Past Surgical History:  Procedure Laterality Date   LEEP     NECK SURGERY       OB History    Gravida  3   Para  2   Term  2   Preterm  0   AB  1   Living  2     SAB  0   TAB  0   Ectopic  0   Multiple  0   Live Births              No family history on file.  Social History   Tobacco Use   Smoking status: Current Every Day Smoker    Packs/day: 0.50    Types: Cigarettes   Smokeless tobacco: Never Used  Substance Use Topics   Alcohol use: No    Alcohol/week: 1.0 standard drink    Types: 1 Standard drinks or equivalent per week   Drug use: Yes    Types: Marijuana    Home Medications Prior to Admission medications   Medication Sig Start Date End Date Taking? Authorizing Provider  cetirizine (ZYRTEC) 10 MG tablet Take by mouth. 05/18/17  Yes [provider]  DULoxetine (CYMBALTA) 30 MG capsule TAKE 1 CAPSULE(30 MG) BY MOUTH DAILY 09/28/18  Yes [provider]  hydrochlorothiazide (HYDRODIURIL) 25 MG tablet  Take by mouth. 09/28/18  Yes [provider]  medroxyPROGESTERone (DEPO-PROVERA) 150 MG/ML injection Inject into the muscle. 03/28/19  Yes [provider]  ascorbic acid (VITAMIN C) 100 MG tablet Take by mouth.    [provider]  aspirin EC 81 MG tablet Take 81 mg by mouth daily.    [provider]  cetirizine (ZYRTEC) 10 MG tablet Take 10 mg by mouth daily. 02/18/16   [provider]  Cholecalciferol 10 MCG (400 UNIT) CAPS Take 1 capsule by mouth daily.    [provider]  ciprofloxacin-dexamethasone (CIPRODEX) OTIC suspension Place 4 drops into the right ear 2 (two) times daily.    [provider]  cyclobenzaprine (FLEXERIL) 10 MG tablet Take 1 tablet (10 mg total) by mouth 2 (two) times daily as needed for muscle spasms. Patient not taking: Reported on 09/25/2016 08/30/15   Mesner, Corene Cornea, MD  hydrochlorothiazide (HYDRODIURIL) 25 MG tablet Take 25 mg by mouth daily. 07/01/16   [provider]  HYDROcodone-acetaminophen (NORCO/VICODIN) 5-325 MG tablet Take 2 tablets by mouth every 4 (four) hours as needed. 10/15/19   Davonna Belling,  MD  medroxyPROGESTERone (DEPO-PROVERA) 400 MG/ML SUSP injection Inject 400 mg into the muscle once.    [provider]  vitamin B-12 (CYANOCOBALAMIN) 250 MCG tablet Take by mouth.    [provider]    Allergies    Carbomer  Review of Systems   Review of Systems  Constitutional: Positive for appetite change and chills.  HENT: Positive for sore throat.   Respiratory: Negative for shortness of breath.   Cardiovascular: Negative for chest pain.  Musculoskeletal: Positive for myalgias.  Skin: Negative for rash.  Neurological: Negative for weakness.  Psychiatric/Behavioral: Negative for behavioral problems.    Physical Exam Updated Vital Signs BP (!) 146/99 (BP Location: Left Arm)    Pulse 68    Temp 99.5 F (37.5 C) (Oral)    Resp 18    Ht 5\' 11"  (1.803 m)    Wt 65.8 kg     SpO2 99%    BMI 20.22 kg/m   Physical Exam Vitals reviewed.  HENT:     Head: Normocephalic.     Right Ear: Tympanic membrane normal.     Mouth/Throat:     Mouth: Mucous membranes are moist.     Tonsils: No tonsillar exudate or tonsillar abscesses.     Comments: Posterior pharyngeal erythema without exudate.  Mild swelling. Eyes:     Pupils: Pupils are equal, round, and reactive to light.  Cardiovascular:     Rate and Rhythm: Normal rate and regular rhythm.  Pulmonary:     Breath sounds: No wheezing or rhonchi.  Abdominal:     Tenderness: There is no abdominal tenderness.  Neurological:     Mental Status: She is alert.     ED Results / Procedures / Treatments   Labs (all labs ordered are listed, but only abnormal results are displayed) Labs Reviewed  GROUP A STREP BY PCR - Abnormal; Notable for the following components:      Result Value   Group A Strep by PCR DETECTED (*)    All other components within normal limits  RESPIRATORY PANEL BY RT PCR (FLU A&B, COVID)    EKG None  Radiology No results found.  Procedures Procedures (including critical care time)  Medications Ordered in ED Medications  penicillin g benzathine (BICILLIN LA) 1200000 UNIT/2ML injection 1.2 Million Units (1.2 Million Units Intramuscular Given 10/15/19 2008)  predniSONE (DELTASONE) tablet 60 mg (60 mg Oral Given 10/15/19 2007)    ED Course  I have reviewed the triage vital signs and the nursing notes.  Pertinent labs & imaging results that were available during my care of the patient were reviewed by me and considered in my medical decision making (see chart for details).    MDM Rules/Calculators/A&P                          Patient with sore throat.  Positive strep test.  Negative Covid test.  Does not appear to have peritonsillar abscess.  Ear does not appear to have an infection.  Will discharge home with symptomatic treatment.  Has been given IM penicillin shot here and dose of  prednisone. Final Clinical Impression(s) / ED Diagnoses Final diagnoses:  Strep pharyngitis    Rx / DC Orders ED Discharge Orders         Ordered    HYDROcodone-acetaminophen (NORCO/VICODIN) 5-325 MG tablet  Every 4 hours PRN        10/15/19 2009  Davonna Belling, MD 10/15/19 2010

## 2019-10-15 NOTE — ED Triage Notes (Signed)
Pt presents to for right ear pain and sore throat x 3 days. Reports headache starting today. Denies contact with no covid positive. Denies fevers. Did not take BP med today

## 2019-10-15 NOTE — ED Notes (Signed)
Pt complaint of sore throat for the last 3 days   With ear pain   Here for eval

## 2019-10-17 MED FILL — Hydrocodone-Acetaminophen Tab 5-325 MG: ORAL | Qty: 6 | Status: AC

## 2020-03-30 ENCOUNTER — Emergency Department (HOSPITAL_COMMUNITY): Payer: Medicaid Other

## 2020-03-30 ENCOUNTER — Encounter (HOSPITAL_COMMUNITY): Payer: Self-pay | Admitting: Emergency Medicine

## 2020-03-30 ENCOUNTER — Inpatient Hospital Stay (HOSPITAL_COMMUNITY)
Admission: EM | Admit: 2020-03-30 | Discharge: 2020-04-02 | DRG: 439 | Disposition: A | Discharge status: Home / Self Care | Payer: Medicaid Other | Attending: Internal Medicine | Admitting: Internal Medicine

## 2020-03-30 ENCOUNTER — Other Ambulatory Visit: Payer: Self-pay

## 2020-03-30 DIAGNOSIS — E872 Acidosis: Secondary | ICD-10-CM | POA: Diagnosis present

## 2020-03-30 DIAGNOSIS — E876 Hypokalemia: Secondary | ICD-10-CM | POA: Diagnosis present

## 2020-03-30 DIAGNOSIS — F17210 Nicotine dependence, cigarettes, uncomplicated: Secondary | ICD-10-CM | POA: Diagnosis present

## 2020-03-30 DIAGNOSIS — N801 Endometriosis of ovary: Secondary | ICD-10-CM | POA: Diagnosis present

## 2020-03-30 DIAGNOSIS — Z793 Long term (current) use of hormonal contraceptives: Secondary | ICD-10-CM

## 2020-03-30 DIAGNOSIS — I700 Atherosclerosis of aorta: Secondary | ICD-10-CM | POA: Diagnosis present

## 2020-03-30 DIAGNOSIS — I119 Hypertensive heart disease without heart failure: Secondary | ICD-10-CM | POA: Diagnosis present

## 2020-03-30 DIAGNOSIS — K8591 Acute pancreatitis with uninfected necrosis, unspecified: Principal | ICD-10-CM | POA: Diagnosis present

## 2020-03-30 DIAGNOSIS — Z800 Family history of malignant neoplasm of digestive organs: Secondary | ICD-10-CM

## 2020-03-30 DIAGNOSIS — Z20822 Contact with and (suspected) exposure to covid-19: Secondary | ICD-10-CM | POA: Diagnosis present

## 2020-03-30 DIAGNOSIS — D649 Anemia, unspecified: Secondary | ICD-10-CM

## 2020-03-30 DIAGNOSIS — K863 Pseudocyst of pancreas: Secondary | ICD-10-CM | POA: Diagnosis present

## 2020-03-30 DIAGNOSIS — R9389 Abnormal findings on diagnostic imaging of other specified body structures: Secondary | ICD-10-CM

## 2020-03-30 DIAGNOSIS — Z888 Allergy status to other drugs, medicaments and biological substances status: Secondary | ICD-10-CM

## 2020-03-30 DIAGNOSIS — Z79899 Other long term (current) drug therapy: Secondary | ICD-10-CM

## 2020-03-30 DIAGNOSIS — K859 Acute pancreatitis without necrosis or infection, unspecified: Secondary | ICD-10-CM | POA: Diagnosis present

## 2020-03-30 DIAGNOSIS — R319 Hematuria, unspecified: Secondary | ICD-10-CM | POA: Diagnosis present

## 2020-03-30 DIAGNOSIS — D62 Acute posthemorrhagic anemia: Secondary | ICD-10-CM | POA: Diagnosis present

## 2020-03-30 DIAGNOSIS — F32A Depression, unspecified: Secondary | ICD-10-CM | POA: Diagnosis present

## 2020-03-30 DIAGNOSIS — N83209 Unspecified ovarian cyst, unspecified side: Secondary | ICD-10-CM

## 2020-03-30 DIAGNOSIS — Q453 Other congenital malformations of pancreas and pancreatic duct: Secondary | ICD-10-CM

## 2020-03-30 DIAGNOSIS — I10 Essential (primary) hypertension: Secondary | ICD-10-CM | POA: Diagnosis present

## 2020-03-30 DIAGNOSIS — N80129 Deep endometriosis of ovary, unspecified ovary: Secondary | ICD-10-CM | POA: Diagnosis present

## 2020-03-30 DIAGNOSIS — Z7982 Long term (current) use of aspirin: Secondary | ICD-10-CM

## 2020-03-30 LAB — I-STAT BETA HCG BLOOD, ED (MC, WL, AP ONLY): I-stat hCG, quantitative: <5 m[IU]/mL (ref <5)

## 2020-03-30 LAB — CBC
HCT: 33 % — ABNORMAL LOW (ref 36.0–46.0)
Hemoglobin: 11.1 g/dL — ABNORMAL LOW (ref 12.0–15.0)
MCH: 29.2 pg (ref 26.0–34.0)
MCHC: 33.6 g/dL (ref 30.0–36.0)
MCV: 86.8 fL (ref 80.0–100.0)
Platelets: 328 10*3/uL (ref 150–400)
RBC: 3.8 MIL/uL — ABNORMAL LOW (ref 3.87–5.11)
RDW: 14.3 % (ref 11.5–15.5)
WBC: 8.5 10*3/uL (ref 4.0–10.5)
nRBC: 0 % (ref 0.0–0.2)

## 2020-03-30 LAB — COMPREHENSIVE METABOLIC PANEL
ALT: 12 U/L (ref 0–44)
AST: 16 U/L (ref 15–41)
Albumin: 3.6 g/dL (ref 3.5–5.0)
Alkaline Phosphatase: 66 U/L (ref 38–126)
Anion gap: 11 (ref 5–15)
BUN: 17 mg/dL (ref 6–20)
CO2: 22 mmol/L (ref 22–32)
Calcium: 9.3 mg/dL (ref 8.9–10.3)
Chloride: 105 mmol/L (ref 98–111)
Creatinine, Ser: 0.84 mg/dL (ref 0.44–1.00)
GFR, Estimated: >60 mL/min (ref >60)
Glucose, Bld: 122 mg/dL — ABNORMAL HIGH (ref 70–99)
Potassium: 3.4 mmol/L — ABNORMAL LOW (ref 3.5–5.1)
Sodium: 138 mmol/L (ref 135–145)
Total Bilirubin: 0.5 mg/dL (ref 0.3–1.2)
Total Protein: 8 g/dL (ref 6.5–8.1)

## 2020-03-30 LAB — LIPASE, BLOOD: Lipase: 41 U/L (ref 11–51)

## 2020-03-30 IMAGING — CT CT ABD-PELV W/ CM
2 of 5 series · 15 of 46 positions shown, 17 images · IV contrast (OMNIPAQUE 300)
Comparison: [DATE]

CLINICAL DATA: Supraumbilical abdominal pain

EXAM:
CT ABDOMEN AND PELVIS WITH CONTRAST
TECHNIQUE: Multidetector CT imaging of the abdomen and pelvis was performed
using the standard protocol following bolus administration of
intravenous contrast.
CONTRAST:  100mL OMNIPAQUE IOHEXOL 300 MG/ML  SOLN

[Series 2: axial st · axial · 0.77mm/px · z∈[+1135,+1540]mm · 12 of 95 slices shown, 14 images]
[im 7/95  soft-tissue]
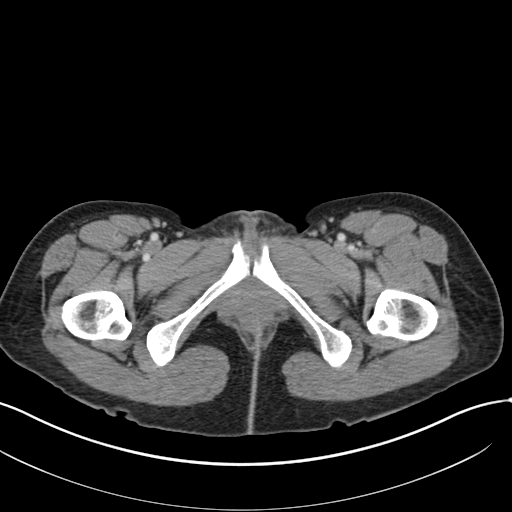
[im 7/95  bone]
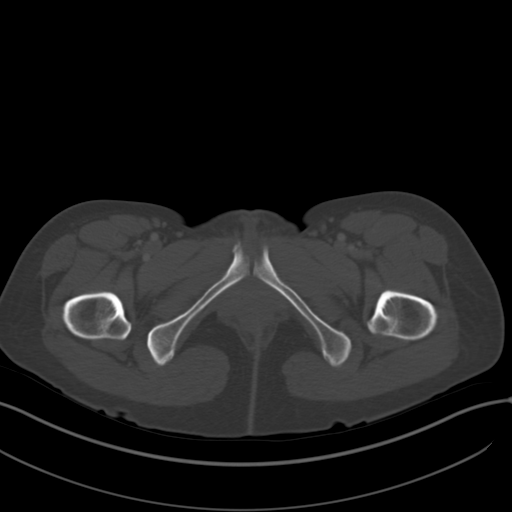
[im 13/95  soft-tissue]
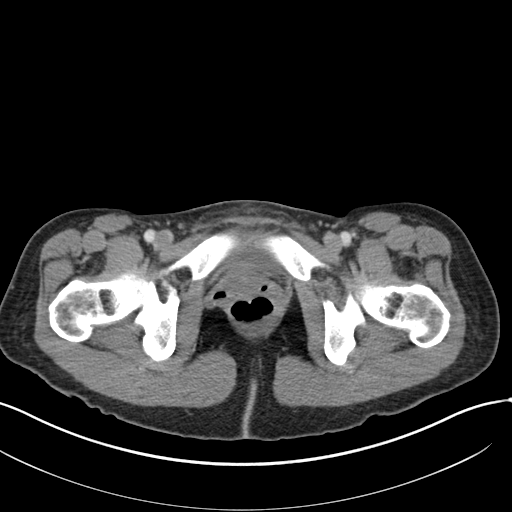
[im 19/95  soft-tissue]
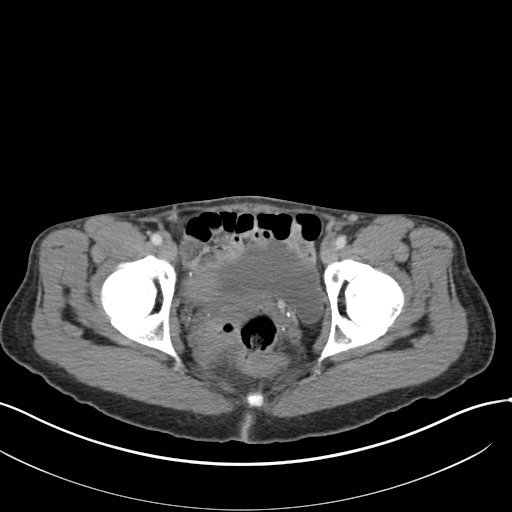
[im 32/95  soft-tissue]
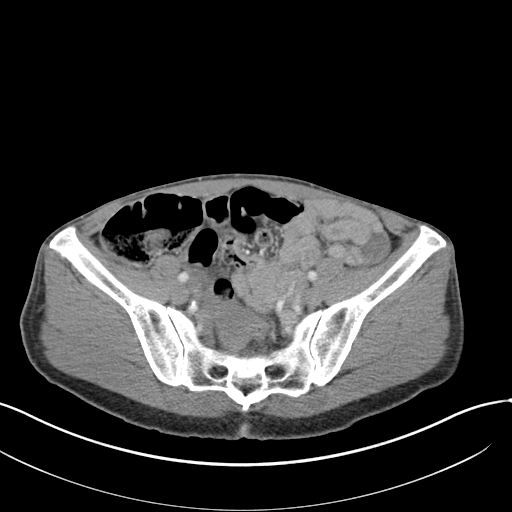
[im 38/95  soft-tissue]
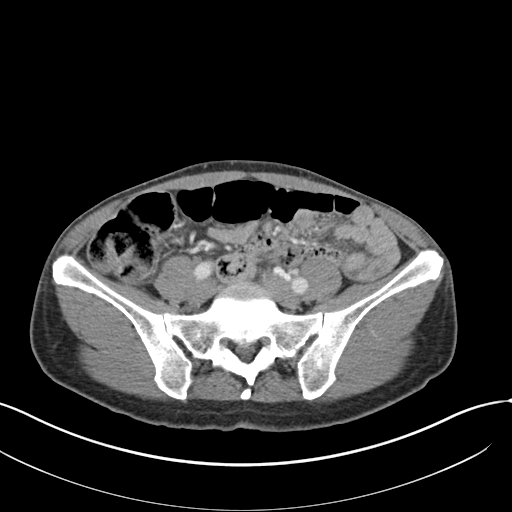
[im 44/95  soft-tissue]
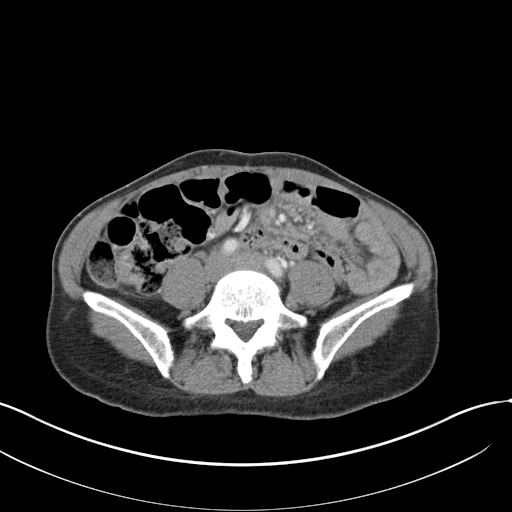
[im 51/95  soft-tissue]
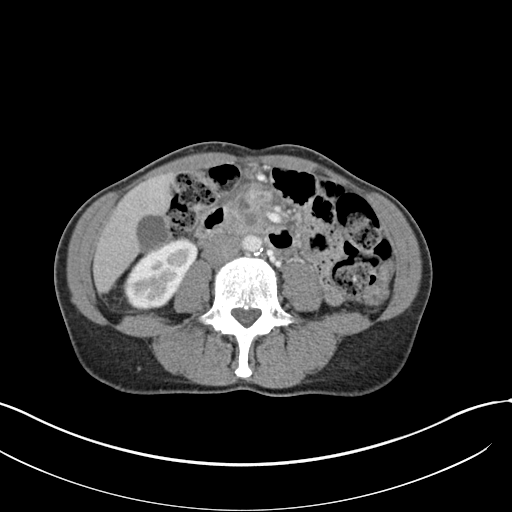
[im 57/95  soft-tissue]
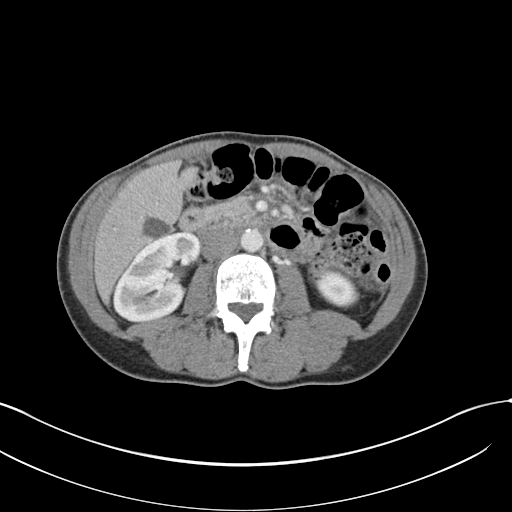
[im 63/95  soft-tissue]
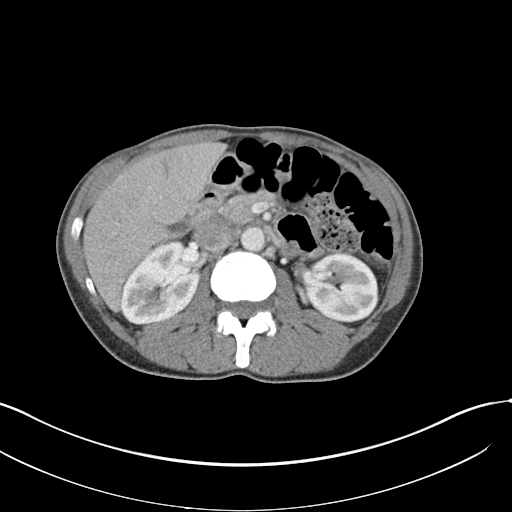
[im 63/95  bone]
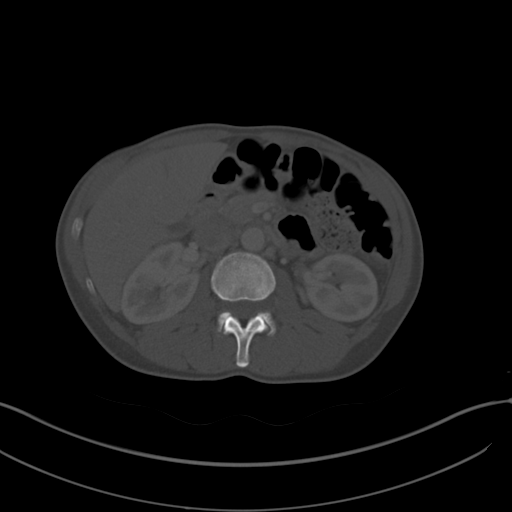
[im 76/95  soft-tissue]
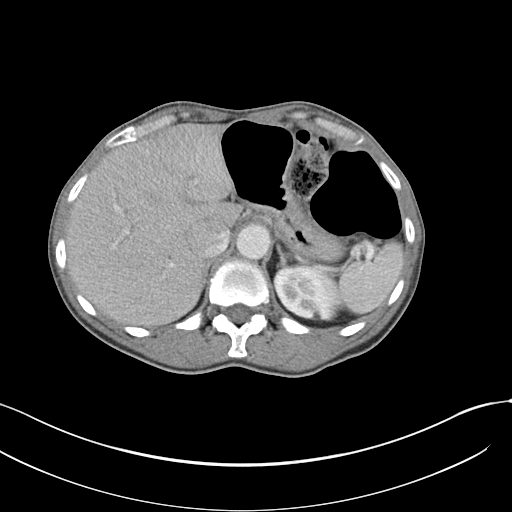
[im 82/95  soft-tissue]
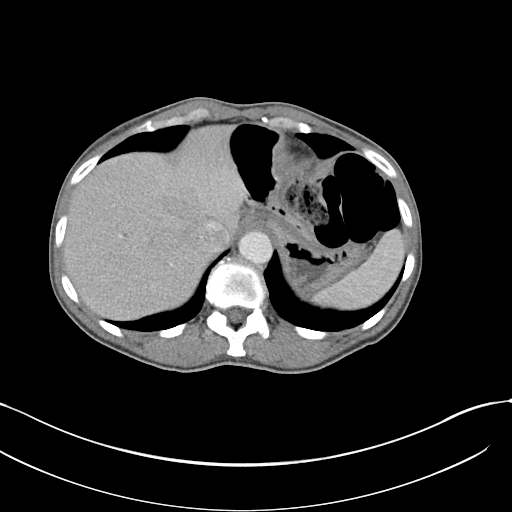
[im 88/95  soft-tissue]
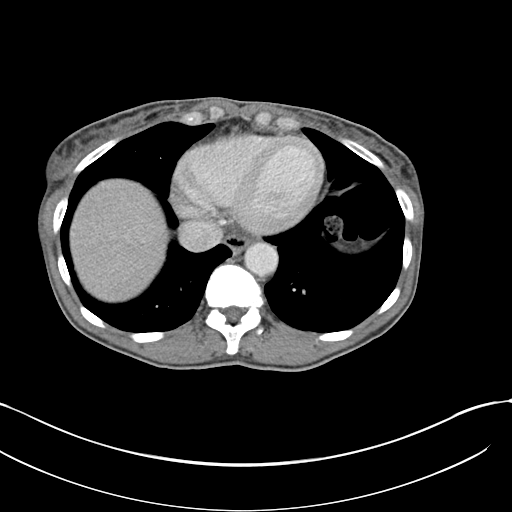

[Series 5: coronal st · coronal · 0.70mm/px · 3 of 116 slices shown]
[im 39/116  soft-tissue]
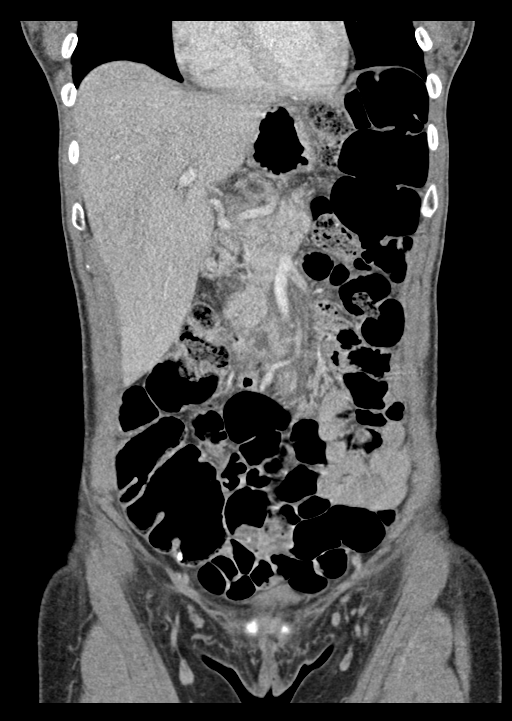
[im 52/116  soft-tissue]
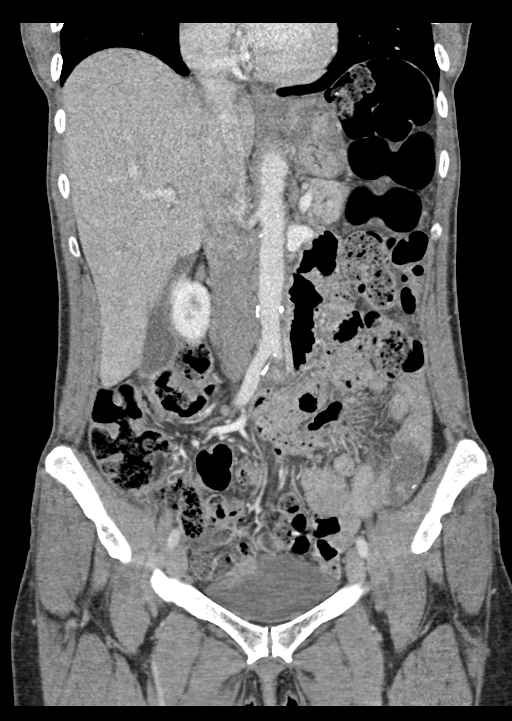
[im 64/116  soft-tissue]
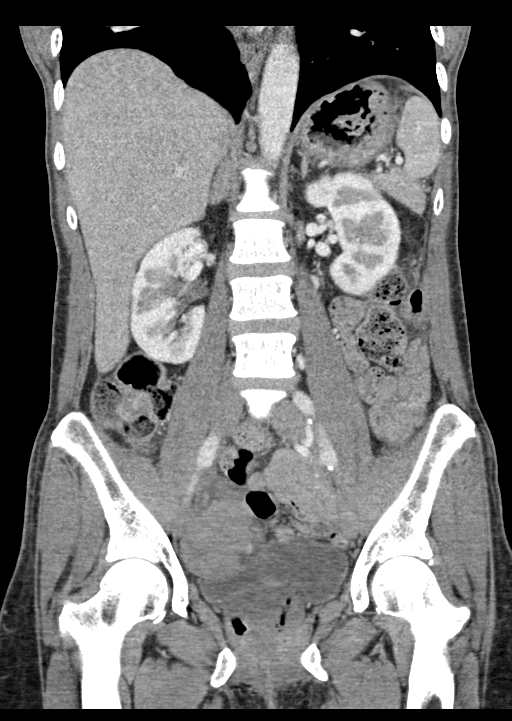

[15 of 46 positions shown; findings below may reference images not displayed]

FINDINGS: Lower chest: No acute pleural or parenchymal lung disease.

Hepatobiliary: No focal liver abnormality is seen. No gallstones,
gallbladder wall thickening, or biliary dilatation.

Pancreas: There is abnormal decreased enhancement of the uncinate
process of the pancreas. Multiple small loculated fluid collections
are seen along the uncinate process of the pancreas and extending
inferiorly, measuring up to 1.2 cm in greatest dimension. Findings
are consistent with pancreatitis with multiple small pseudocysts or
abscesses. These are not amenable to percutaneous drainage. The
remainder of the pancreatic parenchyma enhances normally.

Spleen: Normal in size without focal abnormality.

Adrenals/Urinary Tract: Adrenal glands are unremarkable. Kidneys are
normal, without renal calculi, focal lesion, or hydronephrosis.
Bladder is unremarkable.

Stomach/Bowel: No bowel obstruction or ileus. Moderate gas and stool
throughout the colon. The appendix, if still present, is not well
visualized. No bowel wall thickening or inflammatory change.

Vascular/Lymphatic: Aortic atherosclerosis. No enlarged abdominal or
pelvic lymph nodes.

Reproductive: The uterus and left adnexa are unremarkable. The right
adnexa is enlarged measuring up to 4.8 x 7.3 by 5.6 cm. Multiple
rounded hyperdense structures within the right adnexa are noted,
measuring up to 4.4 cm in size. These could reflect hemorrhagic
ovarian cyst. Ultrasound could be considered for further
characterization.

Other: Trace pelvic free fluid is likely physiologic. There is no
free intraperitoneal gas. No abdominal wall hernia.

Musculoskeletal: No acute or destructive bony lesions. Reconstructed
images demonstrate no additional findings.
IMPRESSION: 1. Abnormal decreased enhancement of the uncinate process of the
pancreas, suspicious for pancreatitis. There are multiple small
rounded fluid collections along the inferior margin of the uncinate
process, measuring up to 1.2 cm in size, suspicious for multiple
small loculated pseudocyst or abscesses. Given location and small
size, these are not amenable to percutaneous drainage.
2. Enlarged right adnexa, with multiple hyperdense rounded areas
likely representing hemorrhagic ovarian cysts. If further evaluation
is desired, ultrasound could be considered.
3.  Aortic Atherosclerosis ([HW]-[HW]).

## 2020-03-30 MED ORDER — ONDANSETRON HCL 4 MG/2ML IJ SOLN
4.0000 mg | Freq: Once | INTRAMUSCULAR | Status: AC
  Administered 2020-03-30: 4 mg via INTRAVENOUS
  Filled 2020-03-30: qty 2

## 2020-03-30 MED ORDER — IOHEXOL 300 MG/ML  SOLN
100.0000 mL | Freq: Once | INTRAMUSCULAR | Status: AC | PRN
  Administered 2020-03-30: 100 mL via INTRAVENOUS

## 2020-03-30 MED ORDER — SODIUM CHLORIDE 0.9 % IV BOLUS
1000.0000 mL | Freq: Once | INTRAVENOUS | Status: AC
  Administered 2020-03-30: 1000 mL via INTRAVENOUS

## 2020-03-30 MED ORDER — MORPHINE SULFATE (PF) 4 MG/ML IV SOLN
4.0000 mg | Freq: Once | INTRAVENOUS | Status: AC
Start: 2020-03-30 — End: 2020-03-30
  Administered 2020-03-30: 4 mg via INTRAVENOUS
  Filled 2020-03-30: qty 1

## 2020-03-30 NOTE — ED Triage Notes (Signed)
Patient here from home reporting upper abd pain above belly button. Reports going to see PCP and placed on 3 meds with no relief. States that she is unable to work.

## 2020-03-30 NOTE — ED Provider Notes (Signed)
[facility] DEPT Provider Note   CSN: [id number] Arrival date & time: 03/30/20  2059     History Chief Complaint  Patient presents with  . Abdominal Pain  . Emesis  . Nausea    Mary Ramos is a 50 y.o. female.  Patient presents to the emergency department with a chief complaint of epigastric abdominal pain.  She states that she has been having the symptoms for the past 2 weeks.  She reports associated vomiting.  She states that the pain is limiting her from working.  She denies alcohol use.  She states that she was seen by her doctor and was prescribed treatment for peptic ulcer.  She states that she has also had some hematuria, but denies dysuria.  She has follow-up with a urologist in 2 months.  She states that the treatments that have been prescribed have not been helping.  She denies any fever.  Nothing makes the symptoms better or worse.  The history is provided by the patient. No language interpreter was used.       Past Medical History:  Diagnosis Date  . Hypertension     Patient Active Problem List   Diagnosis Date Noted  . Current moderate episode of major depressive disorder without prior episode ([place]) 11/23/2017  . Degenerative disc disease, cervical 11/04/2016  . Essential hypertension 07/01/2016  . Tobacco dependence 07/01/2016  . ASCUS with positive high risk HPV cervical pap smear 12/02/2013    Past Surgical History:  Procedure Laterality Date  . LEEP    . NECK SURGERY       OB History    Gravida  3   Para  2   Term  2   Preterm  0   AB  1   Living  2     SAB  0   IAB  0   Ectopic  0   Multiple  0   Live Births              No family history on file.  Social History   Tobacco Use  . Smoking status: Current Every Day Smoker    Packs/day: 0.50    Types: Cigarettes  . Smokeless tobacco: Never Used  Substance Use Topics  . Alcohol use: No    Alcohol/week: 1.0 standard drink    Types: 1  Standard drinks or equivalent per week  . Drug use: Yes    Types: Marijuana    Home Medications Prior to Admission medications   Medication Sig Start Date End Date Taking? Authorizing Provider  ascorbic acid (VITAMIN C) 100 MG tablet Take by mouth.    [provider]  aspirin EC 81 MG tablet Take 81 mg by mouth daily.    [provider]  cetirizine (ZYRTEC) 10 MG tablet Take 10 mg by mouth daily. 02/18/16   [provider]  cetirizine (ZYRTEC) 10 MG tablet Take by mouth. 05/18/17   [provider]  Cholecalciferol 10 MCG (400 UNIT) CAPS Take 1 capsule by mouth daily.    [provider]  ciprofloxacin-dexamethasone (CIPRODEX) OTIC suspension Place 4 drops into the right ear 2 (two) times daily.    [provider]  cyclobenzaprine (FLEXERIL) 10 MG tablet Take 1 tablet (10 mg total) by mouth 2 (two) times daily as needed for muscle spasms. Patient not taking: Reported on 09/25/2016 08/30/15   Mesner, Corene Cornea, MD  DULoxetine (CYMBALTA) 30 MG capsule TAKE 1 CAPSULE(30 MG) BY MOUTH DAILY  09/28/18   [provider]  hydrochlorothiazide (HYDRODIURIL) 25 MG tablet Take 25 mg by mouth daily. 07/01/16   [provider]  hydrochlorothiazide (HYDRODIURIL) 25 MG tablet Take by mouth. 09/28/18   [provider]  HYDROcodone-acetaminophen (NORCO/VICODIN) 5-325 MG tablet Take 2 tablets by mouth every 4 (four) hours as needed. 10/15/19   Davonna Belling, MD  medroxyPROGESTERone (DEPO-PROVERA) 150 MG/ML injection Inject into the muscle. 03/28/19   [provider]  medroxyPROGESTERone (DEPO-PROVERA) 400 MG/ML SUSP injection Inject 400 mg into the muscle once.    [provider]  vitamin B-12 (CYANOCOBALAMIN) 250 MCG tablet Take by mouth.    [provider]    Allergies    Carbomer  Review of Systems   Review of Systems  All other systems reviewed and are negative.   Physical Exam Updated Vital Signs BP (!)  152/97 (BP Location: Left Arm)   Pulse 70   Temp 98.6 F (37 C) (Oral)   Resp 18   SpO2 100%   Physical Exam Vitals and nursing note reviewed.  Constitutional:      General: She is not in acute distress.    Appearance: She is well-developed.  HENT:     Head: Normocephalic and atraumatic.  Eyes:     Conjunctiva/sclera: Conjunctivae normal.  Cardiovascular:     Rate and Rhythm: Normal rate and regular rhythm.     Heart sounds: No murmur heard.   Pulmonary:     Effort: Pulmonary effort is normal. No respiratory distress.     Breath sounds: Normal breath sounds.  Abdominal:     Palpations: Abdomen is soft.     Tenderness: There is no abdominal tenderness.     Comments: Epigastric abdominal discomfort, but without focal tenderness, no Murphy sign, no McBurney point tenderness  Musculoskeletal:     Cervical back: Neck supple.  Skin:    General: Skin is warm and dry.  Neurological:     Mental Status: She is alert and oriented to person, place, and time.  Psychiatric:        Mood and Affect: Mood normal.        Behavior: Behavior normal.     ED Results / Procedures / Treatments   Labs (all labs ordered are listed, but only abnormal results are displayed) Labs Reviewed  LIPASE, BLOOD  COMPREHENSIVE METABOLIC PANEL  CBC  URINALYSIS, ROUTINE W REFLEX MICROSCOPIC  I-STAT BETA HCG BLOOD, ED ([facility], [facility], [facility] ONLY)    EKG None  Radiology No results found.  Procedures Procedures   Medications Ordered in ED Medications  morphine 4 MG/ML injection 4 mg (has no administration in time range)  ondansetron (ZOFRAN) injection 4 mg (has no administration in time range)  sodium chloride 0.9 % bolus 1,000 mL (has no administration in time range)    ED Course  I have reviewed the triage vital signs and the nursing notes.  Pertinent labs & imaging results that were available during my care of the patient were reviewed by me and considered in my medical decision making (see chart  for details).    MDM Rules/Calculators/A&P                         This patient complains of epigastric abdominal pain times several weeks, this involves an extensive number of treatment options, and is a complaint that carries with it a high risk of complications and morbidity.    Differential Dx Gallbladder disease, pancreatitis,  gastritis, GERD, irritable bowel  Pertinent Labs I ordered, reviewed, and interpreted labs, which included CBC, CMP, lipase, which are all fairly reassuring, no leukocytosis, no elevated lipase.  Will add on triglycerides.  Imaging Interpretation I ordered imaging studies which included CT abdomen/pelvis which shows evidence for pancreatitis.   Medications I ordered medication pain and nausea medicine and fluids.  Sources Previous records obtained and reviewed taking Carafate and Zofran prescribed by PCP.   Critical Interventions  None  Reassessments After the interventions stated above, I reevaluated the patient and found feeling somewhat improved with pain medicine.  Consultants [facility], Dr. Trilby Drummer, who is appreciated for admitting.  Plan Admit    Final Clinical Impression(s) / ED Diagnoses Final diagnoses:  Acute pancreatitis, unspecified complication status, unspecified pancreatitis type    Rx / DC Orders ED Discharge Orders    None       Montine Circle, PA-C 03/31/20 0056    Varney Biles, MD 04/01/20 1242

## 2020-03-31 ENCOUNTER — Other Ambulatory Visit: Payer: Self-pay

## 2020-03-31 ENCOUNTER — Observation Stay (HOSPITAL_COMMUNITY): Payer: Medicaid Other

## 2020-03-31 ENCOUNTER — Inpatient Hospital Stay (HOSPITAL_COMMUNITY): Payer: Medicaid Other

## 2020-03-31 ENCOUNTER — Encounter (HOSPITAL_COMMUNITY): Payer: Self-pay | Admitting: Internal Medicine

## 2020-03-31 DIAGNOSIS — I700 Atherosclerosis of aorta: Secondary | ICD-10-CM | POA: Diagnosis not present

## 2020-03-31 DIAGNOSIS — R9389 Abnormal findings on diagnostic imaging of other specified body structures: Secondary | ICD-10-CM | POA: Diagnosis not present

## 2020-03-31 DIAGNOSIS — K8501 Idiopathic acute pancreatitis with uninfected necrosis: Secondary | ICD-10-CM | POA: Diagnosis not present

## 2020-03-31 DIAGNOSIS — K859 Acute pancreatitis without necrosis or infection, unspecified: Secondary | ICD-10-CM | POA: Diagnosis present

## 2020-03-31 DIAGNOSIS — Z7982 Long term (current) use of aspirin: Secondary | ICD-10-CM | POA: Diagnosis not present

## 2020-03-31 DIAGNOSIS — D62 Acute posthemorrhagic anemia: Secondary | ICD-10-CM | POA: Diagnosis not present

## 2020-03-31 DIAGNOSIS — K863 Pseudocyst of pancreas: Secondary | ICD-10-CM | POA: Diagnosis not present

## 2020-03-31 DIAGNOSIS — D649 Anemia, unspecified: Secondary | ICD-10-CM | POA: Diagnosis not present

## 2020-03-31 DIAGNOSIS — E876 Hypokalemia: Secondary | ICD-10-CM | POA: Diagnosis not present

## 2020-03-31 DIAGNOSIS — Z20822 Contact with and (suspected) exposure to covid-19: Secondary | ICD-10-CM | POA: Diagnosis not present

## 2020-03-31 DIAGNOSIS — F32A Depression, unspecified: Secondary | ICD-10-CM | POA: Diagnosis not present

## 2020-03-31 DIAGNOSIS — E872 Acidosis: Secondary | ICD-10-CM | POA: Diagnosis not present

## 2020-03-31 DIAGNOSIS — N801 Endometriosis of ovary: Secondary | ICD-10-CM | POA: Diagnosis not present

## 2020-03-31 DIAGNOSIS — Z888 Allergy status to other drugs, medicaments and biological substances status: Secondary | ICD-10-CM | POA: Diagnosis not present

## 2020-03-31 DIAGNOSIS — R319 Hematuria, unspecified: Secondary | ICD-10-CM

## 2020-03-31 DIAGNOSIS — I10 Essential (primary) hypertension: Secondary | ICD-10-CM

## 2020-03-31 DIAGNOSIS — K861 Other chronic pancreatitis: Secondary | ICD-10-CM | POA: Diagnosis not present

## 2020-03-31 DIAGNOSIS — I119 Hypertensive heart disease without heart failure: Secondary | ICD-10-CM | POA: Diagnosis not present

## 2020-03-31 DIAGNOSIS — Z79899 Other long term (current) drug therapy: Secondary | ICD-10-CM | POA: Diagnosis not present

## 2020-03-31 DIAGNOSIS — Z793 Long term (current) use of hormonal contraceptives: Secondary | ICD-10-CM | POA: Diagnosis not present

## 2020-03-31 DIAGNOSIS — F17210 Nicotine dependence, cigarettes, uncomplicated: Secondary | ICD-10-CM | POA: Diagnosis not present

## 2020-03-31 DIAGNOSIS — K8591 Acute pancreatitis with uninfected necrosis, unspecified: Secondary | ICD-10-CM | POA: Diagnosis not present

## 2020-03-31 DIAGNOSIS — Z800 Family history of malignant neoplasm of digestive organs: Secondary | ICD-10-CM | POA: Diagnosis not present

## 2020-03-31 LAB — URINALYSIS, ROUTINE W REFLEX MICROSCOPIC
Bacteria, UA: NONE SEEN
Bilirubin Urine: NEGATIVE
Glucose, UA: NEGATIVE mg/dL
Ketones, ur: NEGATIVE mg/dL
Leukocytes,Ua: NEGATIVE
Nitrite: NEGATIVE
Protein, ur: 100 mg/dL — AB
RBC / HPF: >50 RBC/hpf — ABNORMAL HIGH (ref 0–5)
Specific Gravity, Urine: 1.024 (ref 1.005–1.030)
pH: 5 (ref 5.0–8.0)

## 2020-03-31 LAB — HIV ANTIBODY (ROUTINE TESTING W REFLEX): HIV Screen 4th Generation wRfx: NONREACTIVE

## 2020-03-31 LAB — COMPREHENSIVE METABOLIC PANEL
ALT: 11 U/L (ref 0–44)
AST: 15 U/L (ref 15–41)
Albumin: 3.2 g/dL — ABNORMAL LOW (ref 3.5–5.0)
Alkaline Phosphatase: 57 U/L (ref 38–126)
Anion gap: 9 (ref 5–15)
BUN: 13 mg/dL (ref 6–20)
CO2: 23 mmol/L (ref 22–32)
Calcium: 8.8 mg/dL — ABNORMAL LOW (ref 8.9–10.3)
Chloride: 106 mmol/L (ref 98–111)
Creatinine, Ser: 0.73 mg/dL (ref 0.44–1.00)
GFR, Estimated: >60 mL/min (ref >60)
Glucose, Bld: 95 mg/dL (ref 70–99)
Potassium: 3.4 mmol/L — ABNORMAL LOW (ref 3.5–5.1)
Sodium: 138 mmol/L (ref 135–145)
Total Bilirubin: 0.6 mg/dL (ref 0.3–1.2)
Total Protein: 7 g/dL (ref 6.5–8.1)

## 2020-03-31 LAB — CBC
HCT: 30.3 % — ABNORMAL LOW (ref 36.0–46.0)
Hemoglobin: 9.9 g/dL — ABNORMAL LOW (ref 12.0–15.0)
MCH: 28.5 pg (ref 26.0–34.0)
MCHC: 32.7 g/dL (ref 30.0–36.0)
MCV: 87.3 fL (ref 80.0–100.0)
Platelets: 254 10*3/uL (ref 150–400)
RBC: 3.47 MIL/uL — ABNORMAL LOW (ref 3.87–5.11)
RDW: 14.4 % (ref 11.5–15.5)
WBC: 8 10*3/uL (ref 4.0–10.5)
nRBC: 0 % (ref 0.0–0.2)

## 2020-03-31 LAB — MAGNESIUM: Magnesium: 1.8 mg/dL (ref 1.7–2.4)

## 2020-03-31 LAB — PROTIME-INR
INR: 1.2 (ref 0.8–1.2)
Prothrombin Time: 14.5 seconds (ref 11.4–15.2)

## 2020-03-31 LAB — TRIGLYCERIDES: Triglycerides: 49 mg/dL (ref <150)

## 2020-03-31 LAB — SARS CORONAVIRUS 2 (TAT 6-24 HRS): SARS Coronavirus 2: NEGATIVE

## 2020-03-31 IMAGING — MR MR ABDOMEN WO/W CM MRCP
20 of 23 series · 41 of 48 positions shown · IV contrast (6ml GADAVIST)
Comparison: [DATE] CT abdomen/pelvis.

CLINICAL DATA: Inpatient. Acute pancreatitis. Epigastric abdominal
pain, vomiting and hematuria for 2 weeks. History of peptic ulcer
disease.

EXAM:
MRI ABDOMEN WITHOUT AND WITH CONTRAST (INCLUDING MRCP)
TECHNIQUE: Multiplanar multisequence MR imaging of the abdomen was performed
both before and after the administration of intravenous contrast.
Heavily T2-weighted images of the biliary and pancreatic ducts were
obtained, and three-dimensional MRCP images were rendered by post
processing.
CONTRAST:  6mL GADAVIST GADOBUTROL 1 MMOL/ML IV SOLN

[Series 3: T2 fat-sat · axial · 6.0mm · 1.25mm/px · 1 of 40 slices shown]
[im 1/40]
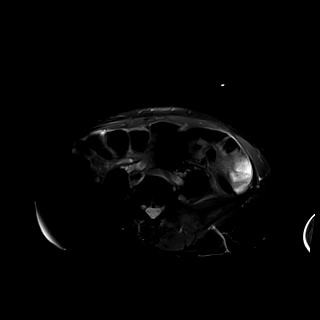

[Series 6: T2 · coronal · 6.0mm · 1.48mm/px · 1 of 28 slices shown (1 of 3)]
[im 1/28]
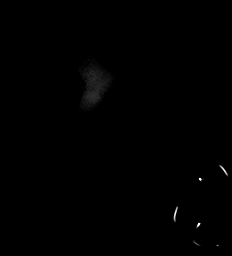

[Series 7: DWI · axial · 6.0mm · 1.49mm/px · z∈[-149,+132]mm · 2 of 80 slices shown (1 of 2)]
[im 1/80]
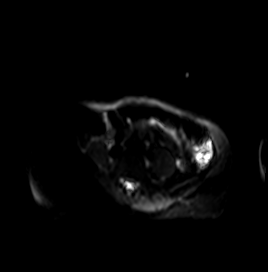
[im 80/80]
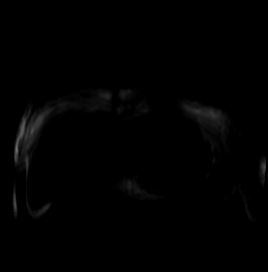

[Series 8: DWI · axial · 6.0mm · 1.49mm/px · 1 of 40 slices shown (2 of 2)]
[im 1/40]
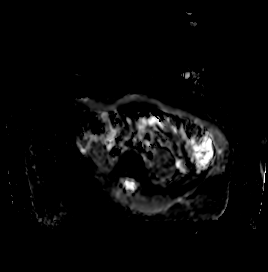

[Series 9: T2 · coronal · 6.0mm · 1.48mm/px · 1 of 28 slices shown (2 of 3)]
[im 1/28]
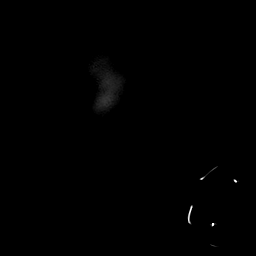

[Series 10: T1 · axial · 3.0mm · 1.19mm/px · z∈[-178,+131]mm · 3 of 104 slices shown (1 of 2)]
[im 1/104]
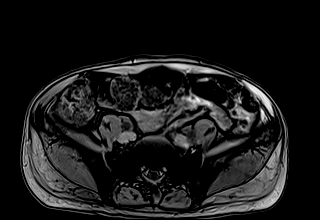
[im 52/104]
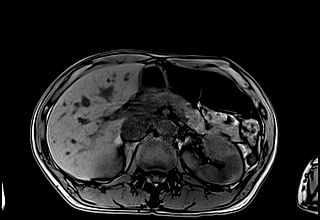
[im 104/104]
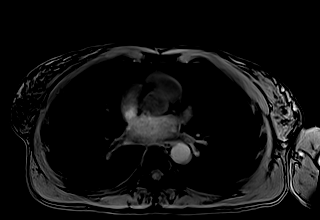

[Series 11: T1 · axial · 3.0mm · 1.19mm/px · z∈[-178,+131]mm · 3 of 104 slices shown (2 of 2)]
[im 1/104]
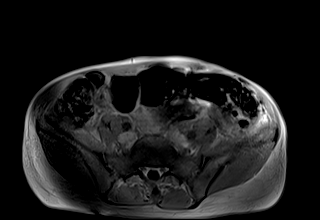
[im 52/104]
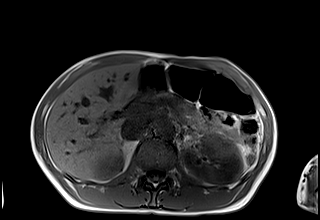
[im 104/104]
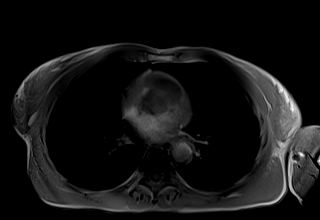

[Series 12: cor obl thk · sagittal · 50.0mm · 0.78mm/px · 1 of 9 slices shown]
[im 1/9]
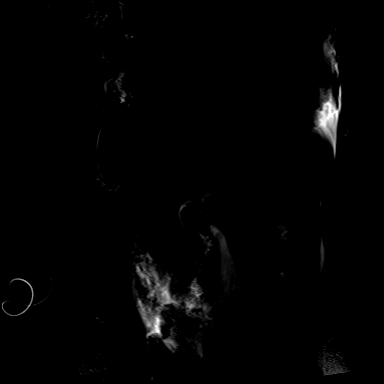

[Series 13: cor_3d_spc_trig-resp · 1 of 5 slices shown]
[im 1/5]
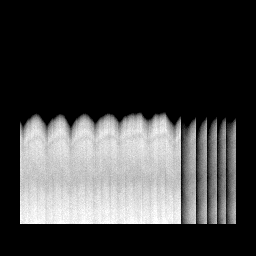

[Series 14: cor_3d_spc_trig · coronal · 1.0mm · 0.49mm/px · 2 of 80 slices shown]
[im 1/80]
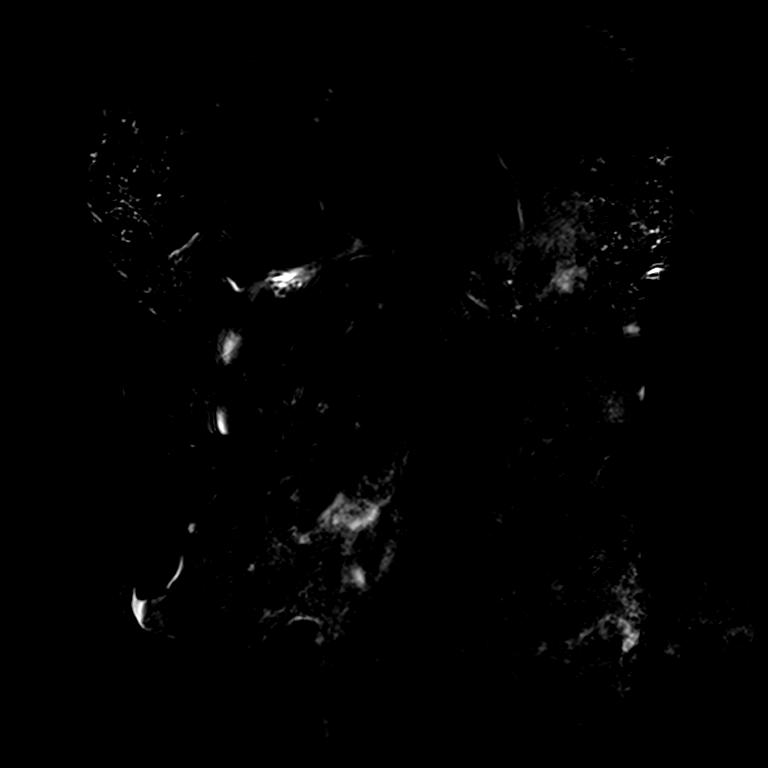
[im 80/80]
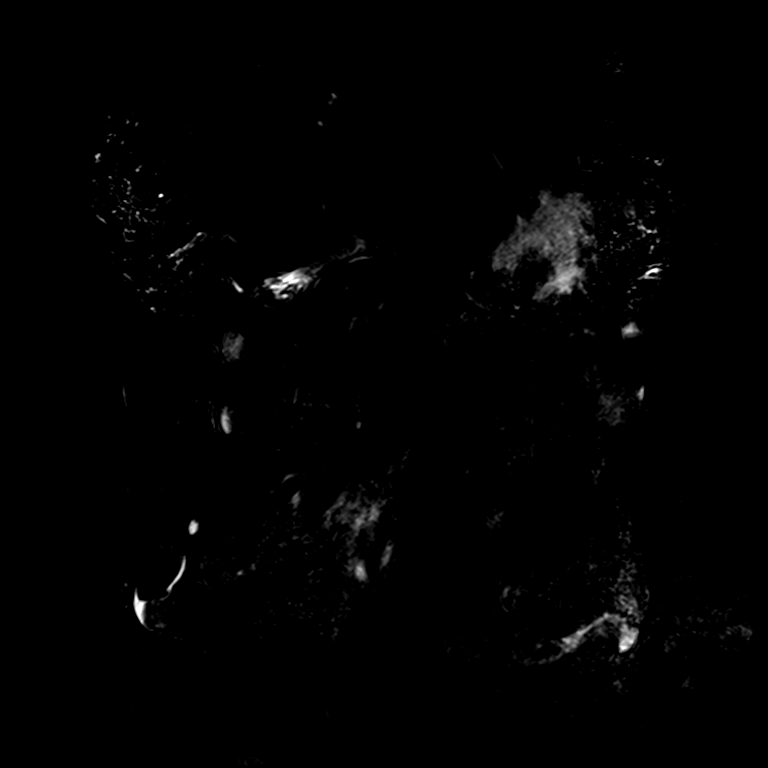

[Series 16: T2 · axial · 6.0mm · 1.56mm/px · 1 of 44 slices shown (3 of 3)]
[im 1/44]
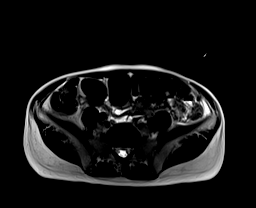

[Series 18: T1 dynamic · axial · 3.0mm · 1.25mm/px · z∈[-166,+119]mm · 3 of 96 slices shown (1 of 9)]
[im 1/96]
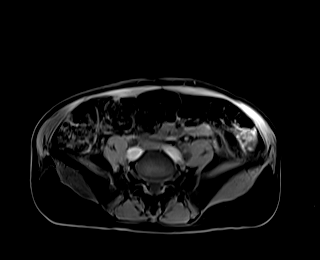
[im 48/96]
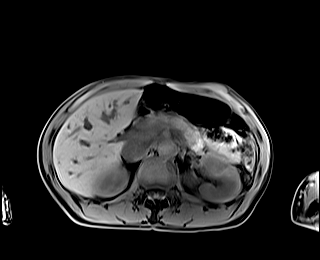
[im 96/96]
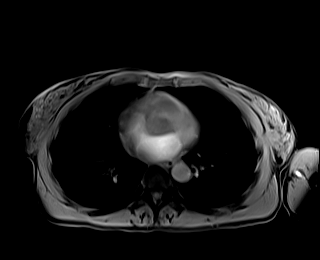

[Series 22: T1 dynamic · axial · 3.0mm · 1.25mm/px · z∈[-166,+119]mm · 3 of 96 slices shown (2 of 9)]
[im 1/96]
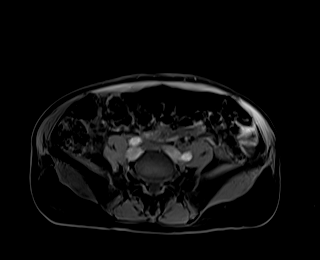
[im 48/96]
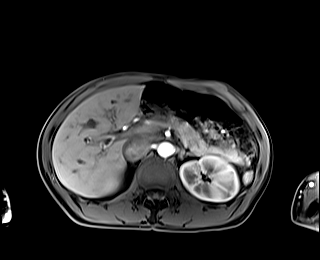
[im 96/96]
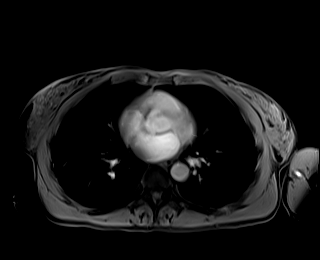

[Series 23: T1 dynamic · axial · 3.0mm · 1.25mm/px · z∈[-166,+119]mm · 3 of 96 slices shown (3 of 9)]
[im 1/96]
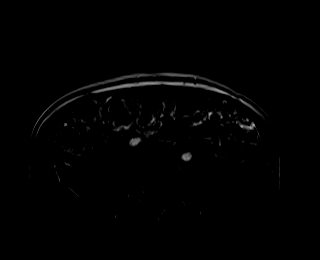
[im 48/96]
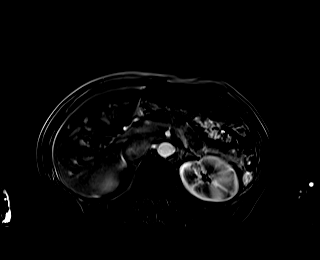
[im 96/96]
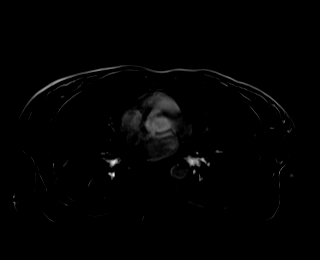

[Series 26: T1 dynamic · axial · 3.0mm · 1.25mm/px · z∈[-166,+119]mm · 3 of 96 slices shown (4 of 9)]
[im 1/96]
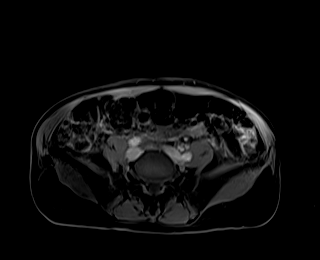
[im 48/96]
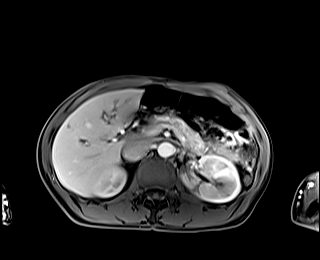
[im 96/96]
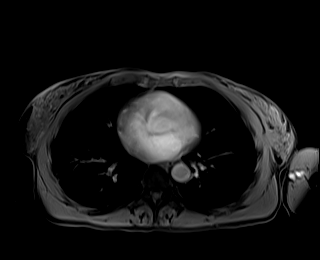

[Series 27: T1 dynamic · axial · 3.0mm · 1.25mm/px · z∈[-166,+119]mm · 3 of 96 slices shown (5 of 9)]
[im 1/96]
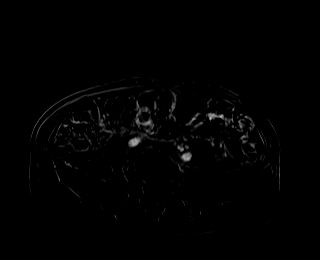
[im 48/96]
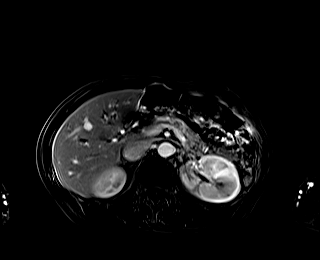
[im 96/96]
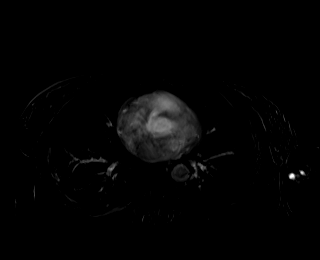

[Series 30: T1 dynamic · axial · 3.0mm · 1.25mm/px · z∈[-166,+119]mm · 3 of 96 slices shown (6 of 9)]
[im 1/96]
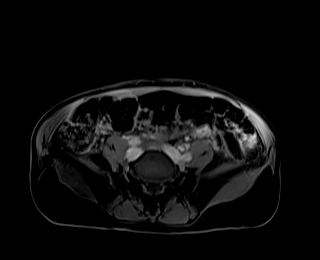
[im 48/96]
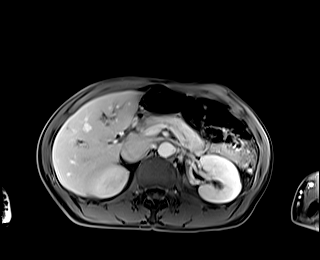
[im 96/96]
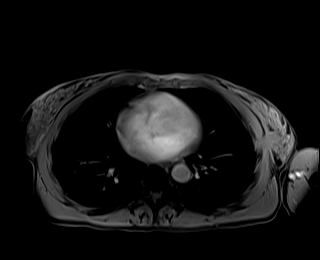

[Series 31: T1 dynamic · axial · 3.0mm · 1.25mm/px · z∈[-166,+119]mm · 3 of 96 slices shown (7 of 9)]
[im 1/96]
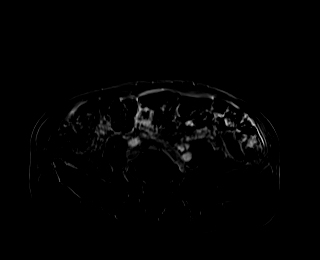
[im 48/96]
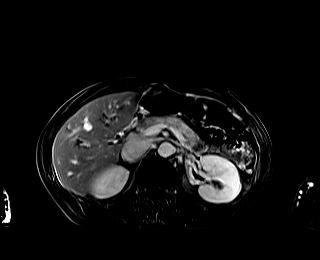
[im 96/96]
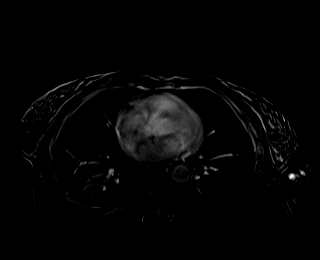

[Series 33: T1 dynamic · coronal · 5.0mm · 1.31mm/px · 2 of 72 slices shown (8 of 9)]
[im 1/72]
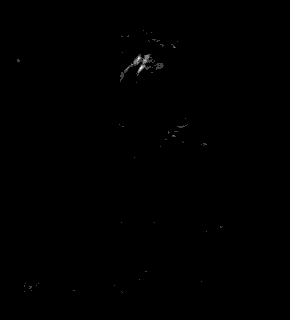
[im 72/72]
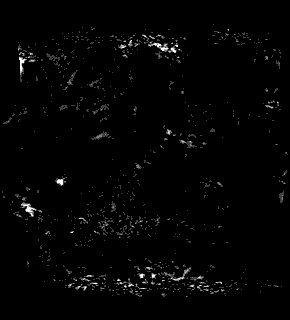

[Series 36: T1 dynamic · axial · 3.0mm · 1.25mm/px · 1 of 96 slices shown (9 of 9)]
[im 1/96]
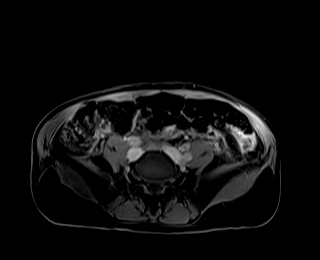

[41 of 48 positions shown; findings below may reference images not displayed]

FINDINGS: Lower chest: No acute abnormality at the lung bases.

Hepatobiliary: Normal liver size and configuration. No hepatic
steatosis. No liver mass. Normal gallbladder with no cholelithiasis.
No significant intrahepatic or extrahepatic biliary ductal
dilatation. Common bile duct diameter 3 mm. No choledocholithiasis.
No biliary strictures, masses or beading. Generalized mild
periportal edema.

Pancreas: Diffuse pancreatic parenchymal edema and thickening, most
prominently involving the pancreatic head, with relative sparing of
the pancreatic tail. There are patchy regions of pre-contrast T1
hyperintensity in the pancreatic head (series 18/image 74)
compatible with mild hemorrhage. There are patchy regions of
decreased parenchymal perfusion throughout the pancreatic head.
Findings are compatible with acute pancreatitis with necrotizing and
mildly hemorrhagic change in the pancreatic head. No significant
pancreatic duct dilation (2 mm diameter). No pancreas divisum. There
are clustered complex cystic lesions in the inferior pancreatic
head, largest 2.6 x 1.5 cm (series 30/image 71) with mildly
thickened enhancing wall and thickening eccentric internal
septation, probably evolving pseudocysts.

Spleen: Normal size. No mass.

Adrenals/Urinary Tract: Normal adrenals. No hydronephrosis.
Subcentimeter simple bilateral renal cysts. No suspicious renal
masses.

Stomach/Bowel: Normal non-distended stomach. Moderate gaseous
distention of the visualized large bowel. No small bowel dilatation.
No bowel wall thickening.

Vascular/Lymphatic: Atherosclerotic nonaneurysmal abdominal aorta.
Patent portal, splenic, hepatic and renal veins. No pathologically
enlarged lymph nodes in the abdomen.

Other: No abdominal ascites or focal fluid collection.

Musculoskeletal: No aggressive appearing focal osseous lesions.
IMPRESSION: 1. Acute pancreatitis, which is necrotizing and mildly hemorrhagic
in the pancreatic head. No significant biliary or pancreatic duct
dilation. CBD diameter 3 mm.
2. Clustered complex cystic lesions in the inferior pancreatic head,
largest 2.6 x 1.5 cm with mildly thickened enhancing wall and
eccentric internal septation, probably evolving pseudocysts.
Follow-up MRI abdomen without and with IV contrast recommended in 3
months.
3. No cholelithiasis.  No choledocholithiasis.
4. Generalized mild periportal edema, nonspecific.

## 2020-03-31 IMAGING — US US PELVIS COMPLETE TRANSABD/TRANSVAG W DUPLEX
1 series · 13 of 25 positions shown · non-contrast
Comparison: CT [DATE]

CLINICAL DATA: Evaluate right ovary.  Abnormal CT.

EXAM:
TRANSABDOMINAL AND TRANSVAGINAL ULTRASOUND OF PELVIS
DOPPLER ULTRASOUND OF OVARIES
TECHNIQUE: Both transabdominal and transvaginal ultrasound examinations of the
pelvis were performed. Transabdominal technique was performed for
global imaging of the pelvis including uterus, ovaries, adnexal
regions, and pelvic cul-de-sac.
It was necessary to proceed with endovaginal exam following the
transabdominal exam to visualize the ovaries. Color and duplex
Doppler ultrasound was utilized to evaluate blood flow to the
ovaries.

[Series 1: us pelvis complete transabd/transvag w duplex · 78 acquisitions, 13 frames shown]
[im 1/78]
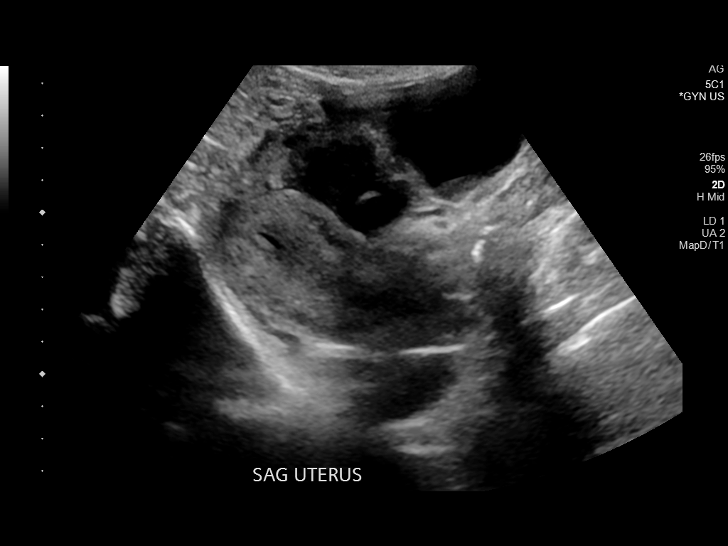
[im 7/78]
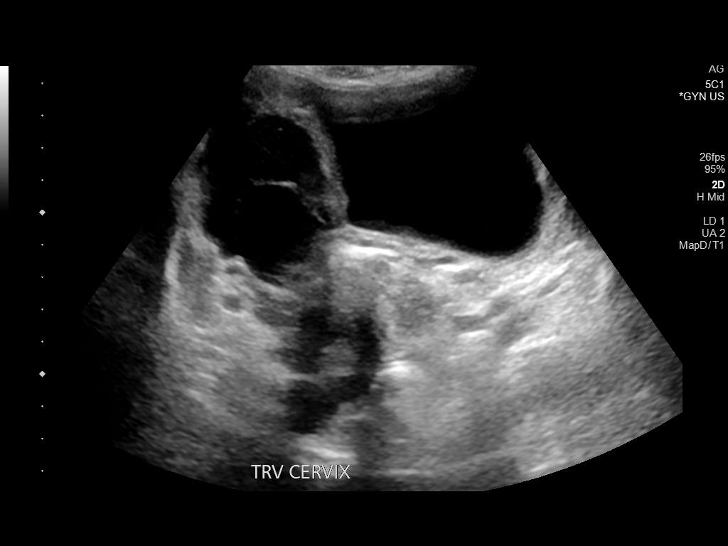
[im 13/78]
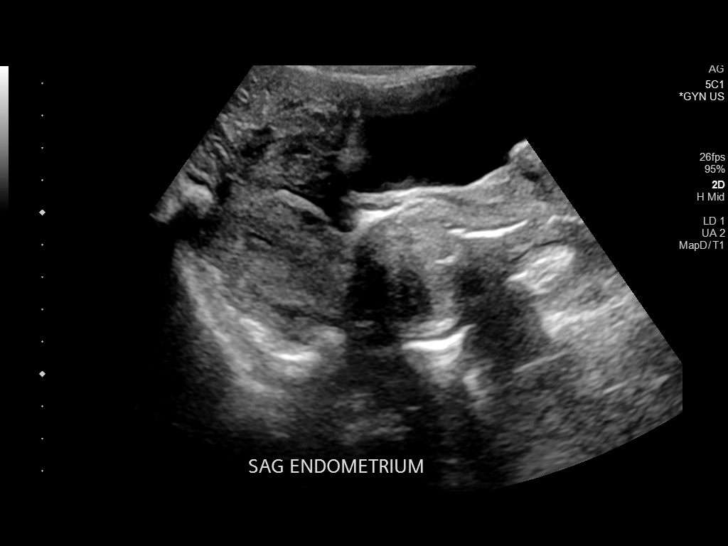
[im 20/78]
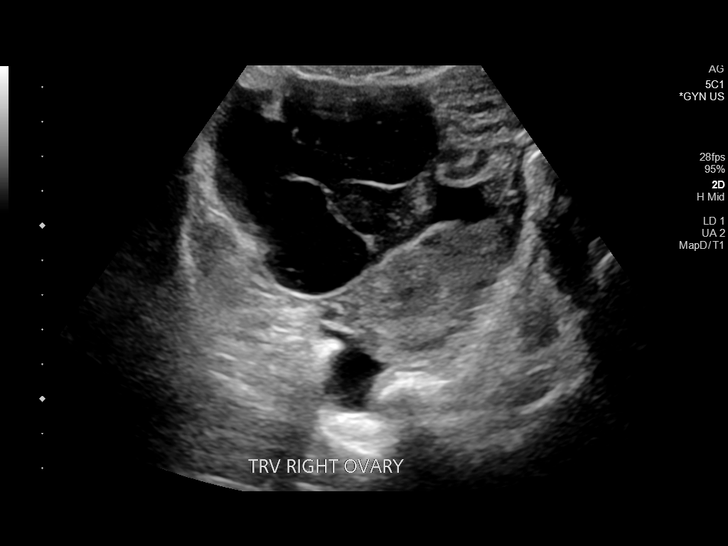
[im 26/78]
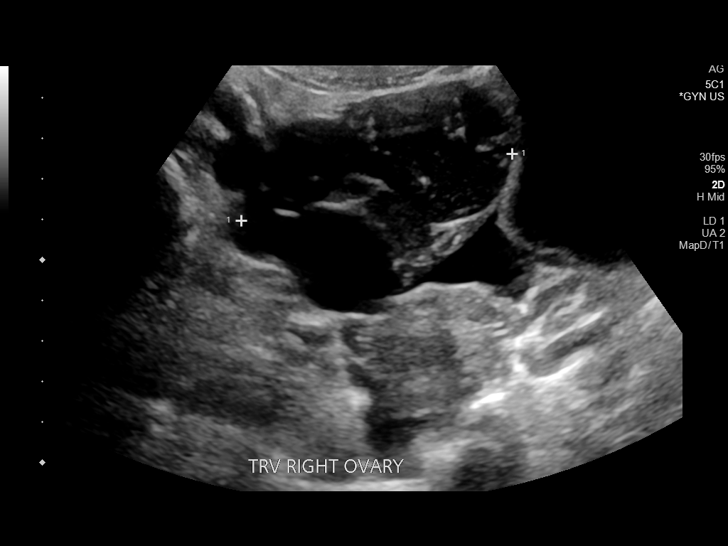
[im 33/78]
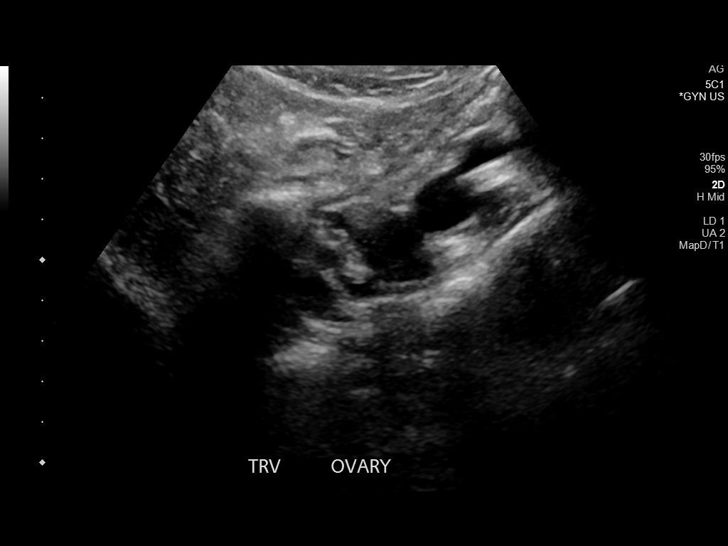
[im 39/78]
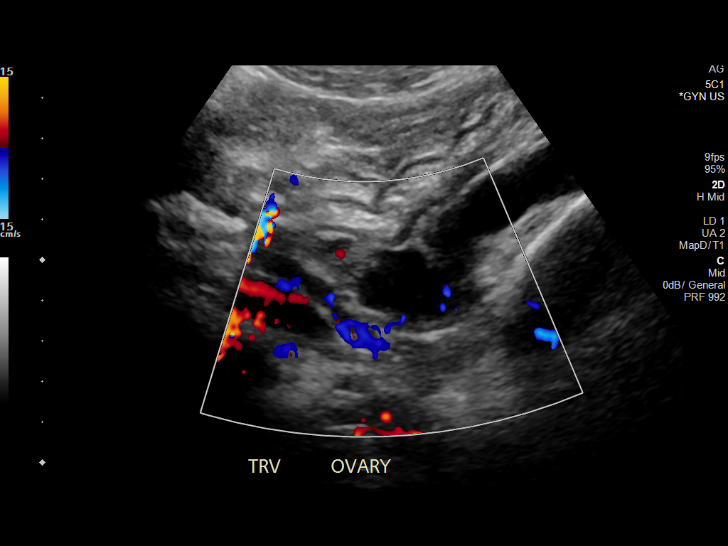
[im 45/78]
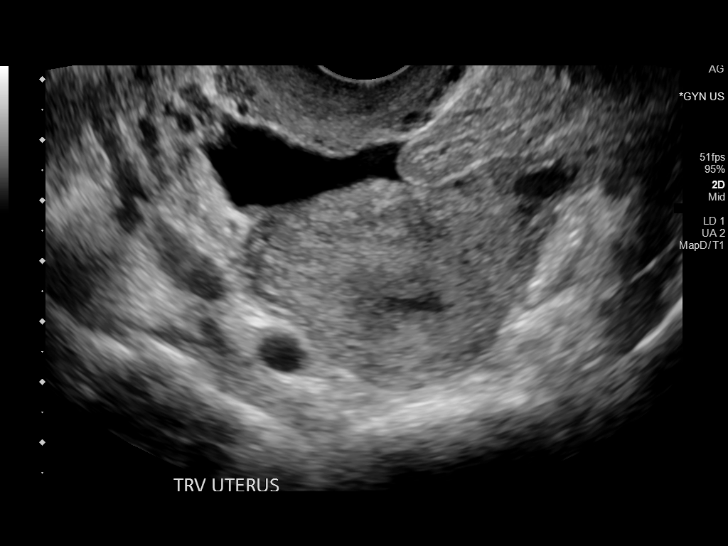
[im 52/78]
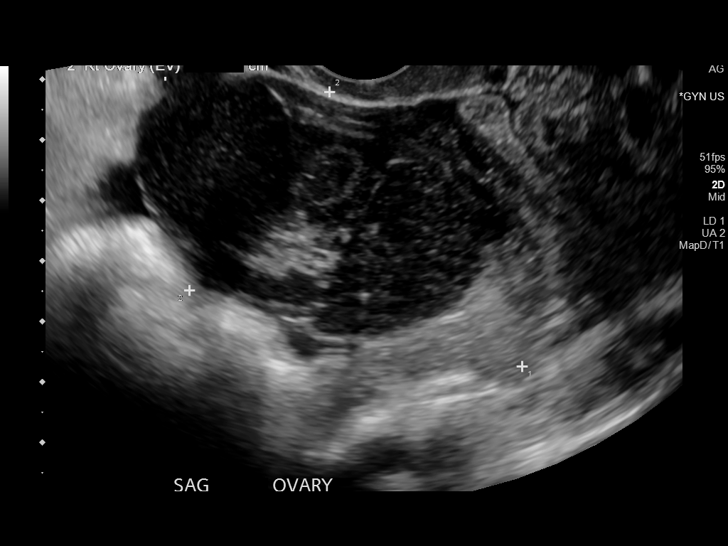
[im 58/78]
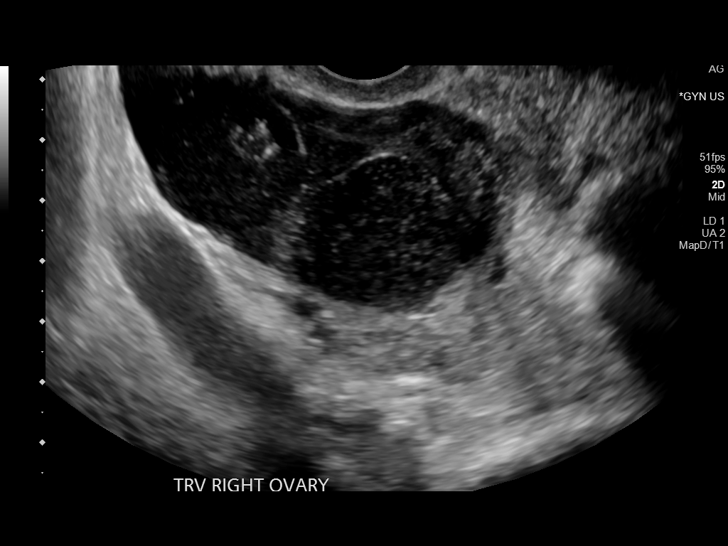
[im 65/78]
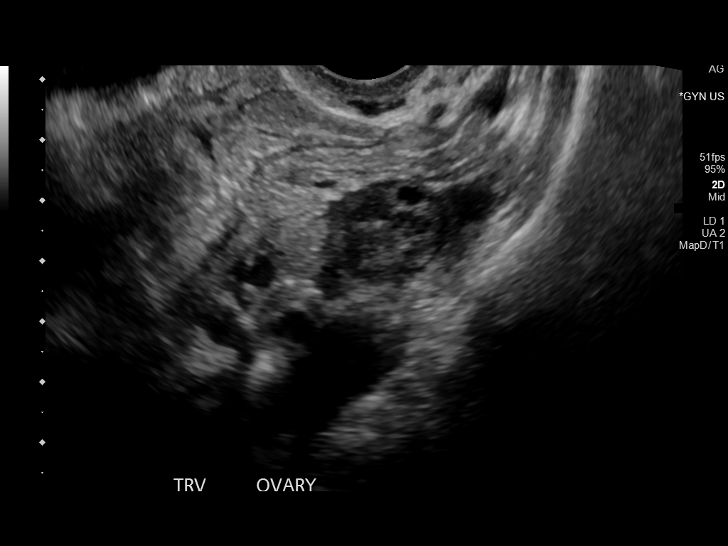
[im 71/78]
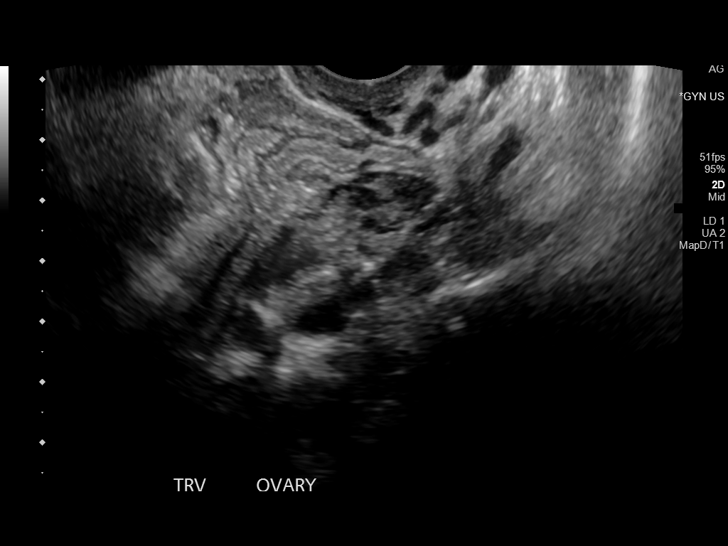
[im 78/78]
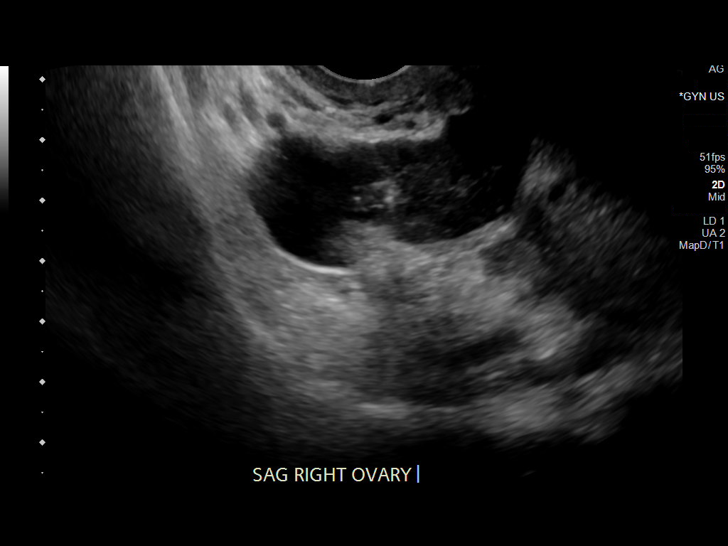

[13 of 25 positions shown; findings below may reference images not displayed]

FINDINGS: Uterus

Measurements: 7.7 x 3.8 x 4.8 cm = volume: 74 mL. No fibroids or
other mass visualized.

Endometrium

Thickness: 5 mm.  No focal abnormality visualized.

Right ovary

Measurements: 7.6 x 4.0 x 7.0 cm = volume: 111 mL. Complex
multi-septated cystic mass within the right ovary measuring
approximately 6.0 x 4.3 x 7.2 cm. Echogenic nonvascular mural
nodules (series 1, images 52 and 59). Multiple echogenic foci
throughout the cystic spaces with comet-tail artifact.

Left ovary

Measurements: 3.6 x 1.3 x 1.8 cm = volume: 4.4 mL. Normal
appearance/no adnexal mass.

Pulsed Doppler evaluation of both ovaries demonstrates normal
low-resistance arterial and venous waveforms.

Other findings

Small-moderate volume free fluid within the pelvis.
IMPRESSION: 1. Negative for adnexal torsion.
2. Complex multi-septated cystic mass within the right ovary
measuring up to 7.2 cm with sonographic features most suggestive of
a mature cystic teratoma/dermoid cyst. Other cystic ovarian neoplasm
is not excluded. Contrast-enhanced MRI of the pelvis could be
performed for confirmation, as clinically indicated. Recommend
surgical evaluation based on size as this lesion is at risk for
torsion.
3. Small-moderate free fluid within the pelvis.

## 2020-03-31 MED ORDER — ACETAMINOPHEN 325 MG PO TABS
650.0000 mg | ORAL_TABLET | Freq: Four times a day (QID) | ORAL | Status: DC | PRN
  Administered 2020-03-31: 650 mg via ORAL
  Filled 2020-03-31: qty 2

## 2020-03-31 MED ORDER — POTASSIUM CHLORIDE 10 MEQ/100ML IV SOLN
10.0000 meq | INTRAVENOUS | Status: AC
  Administered 2020-03-31 (×3): 10 meq via INTRAVENOUS
  Filled 2020-03-31 (×3): qty 100

## 2020-03-31 MED ORDER — SODIUM CHLORIDE 0.9 % IV SOLN: INTRAVENOUS | Status: DC

## 2020-03-31 MED ORDER — GADOBUTROL 1 MMOL/ML IV SOLN
6.0000 mL | Freq: Once | INTRAVENOUS | Status: AC | PRN
  Administered 2020-03-31: 6 mL via INTRAVENOUS

## 2020-03-31 MED ORDER — ONDANSETRON HCL 4 MG/2ML IJ SOLN
4.0000 mg | Freq: Four times a day (QID) | INTRAMUSCULAR | Status: DC | PRN
  Administered 2020-04-02: 4 mg via INTRAVENOUS
  Filled 2020-03-31: qty 2

## 2020-03-31 MED ORDER — POTASSIUM CHLORIDE CRYS ER 20 MEQ PO TBCR
20.0000 meq | EXTENDED_RELEASE_TABLET | Freq: Two times a day (BID) | ORAL | Status: AC
  Administered 2020-03-31 (×2): 20 meq via ORAL
  Filled 2020-03-31 (×2): qty 1

## 2020-03-31 MED ORDER — SODIUM CHLORIDE 0.9% FLUSH: 3.0000 mL | Freq: Two times a day (BID) | INTRAVENOUS | Status: DC

## 2020-03-31 MED ORDER — SODIUM CHLORIDE 0.9 % IV SOLN: INTRAVENOUS | Status: AC

## 2020-03-31 MED ORDER — HYDROCHLOROTHIAZIDE 25 MG PO TABS: 25.0000 mg | ORAL_TABLET | Freq: Every day | ORAL | Status: DC

## 2020-03-31 MED ORDER — MAGNESIUM SULFATE 2 GM/50ML IV SOLN
2.0000 g | Freq: Once | INTRAVENOUS | Status: AC
  Administered 2020-03-31: 2 g via INTRAVENOUS
  Filled 2020-03-31: qty 50

## 2020-03-31 MED ORDER — LACTATED RINGERS IV BOLUS
1000.0000 mL | Freq: Once | INTRAVENOUS | Status: AC
  Administered 2020-03-31: 1000 mL via INTRAVENOUS

## 2020-03-31 MED ORDER — ACETAMINOPHEN 650 MG RE SUPP: 650.0000 mg | Freq: Four times a day (QID) | RECTAL | Status: DC | PRN

## 2020-03-31 MED ORDER — ONDANSETRON HCL 4 MG PO TABS: 4.0000 mg | ORAL_TABLET | Freq: Four times a day (QID) | ORAL | Status: DC | PRN

## 2020-03-31 MED ORDER — HYDROMORPHONE HCL 1 MG/ML IJ SOLN
0.5000 mg | INTRAMUSCULAR | Status: DC | PRN
  Administered 2020-03-31 – 2020-04-02 (×5): 0.5 mg via INTRAVENOUS
  Filled 2020-03-31 (×5): qty 0.5

## 2020-03-31 NOTE — Plan of Care (Signed)

## 2020-03-31 NOTE — Plan of Care (Signed)
  Problem: Pain Managment: Goal: General experience of comfort will improve Outcome: Progressing   

## 2020-03-31 NOTE — Consult Note (Signed)
Reason for Consult: Pancreatitis Referring Physician: Hospital team  Mary Ramos is an 50 y.o. female.  HPI: Patient seen and examined in hospital computer chart reviewed as well as care everywhere and she has had some upper abdominal pain for about 2 weeks and has had some nausea and vomiting and denies any new medicines or alcohol and supposedly she had some cysts in her abdomen a few years ago and did have x-rays at that point but unfortunately I cannot find any evidence of that on Care Everywhere and she said she had them at a [facility] office in an x-ray facility off of [address] but pancreatitis does not run in the family although other family members are alcoholics and her mother has lupus but no other obvious autoimmune problems run in the family and she does not remember seeing a gynecologist either and she has no other complaints  Past Medical History:  Diagnosis Date  . Hypertension     Past Surgical History:  Procedure Laterality Date  . LEEP    . NECK SURGERY      Family History  Problem Relation Age of Onset  . Lupus Mother   . Pancreatic cancer Father     Social History:  reports that she has been smoking cigarettes. She has been smoking about 0.50 packs per day. She has never used smokeless tobacco. She reports current drug use. Drug: Marijuana. She reports that she does not drink alcohol.  Allergies:  Allergies  Allergen Reactions  . Carbomer Hives    Nickel     Medications: I have reviewed the patient's current medications.  Results for orders placed or performed during the hospital encounter of 03/30/20 (from the past 48 hour(s))  Lipase, blood     Status: None   Collection Time: 03/30/20  9:51 PM  Result Value Ref Range   Lipase 41 11 - 51 U/L    Comment: Performed at [facility], [place] [address] [place], [place] [address]  Comprehensive metabolic panel     Status: Abnormal   Collection Time: 03/30/20  9:51 PM  Result Value Ref  Range   Sodium 138 135 - 145 mmol/L   Potassium 3.4 (L) 3.5 - 5.1 mmol/L   Chloride 105 98 - 111 mmol/L   CO2 22 22 - 32 mmol/L   Glucose, Bld 122 (H) 70 - 99 mg/dL    Comment: Glucose reference range applies only to samples taken after fasting for at least 8 hours.   BUN 17 6 - 20 mg/dL   Creatinine, Ser 0.84 0.44 - 1.00 mg/dL   Calcium 9.3 8.9 - 10.3 mg/dL   Total Protein 8.0 6.5 - 8.1 g/dL   Albumin 3.6 3.5 - 5.0 g/dL   AST 16 15 - 41 U/L   ALT 12 0 - 44 U/L   Alkaline Phosphatase 66 38 - 126 U/L   Total Bilirubin 0.5 0.3 - 1.2 mg/dL   GFR, Estimated >60 >60 mL/min    Comment: (NOTE) Calculated using the CKD-EPI Creatinine Equation (2021)    Anion gap 11 5 - 15    Comment: Performed at [facility], [place] [address], [place],  [address]  CBC     Status: Abnormal   Collection Time: 03/30/20  9:51 PM  Result Value Ref Range   WBC 8.5 4.0 - 10.5 K/uL   RBC 3.80 (L) 3.87 - 5.11 MIL/uL   Hemoglobin 11.1 (L) 12.0 - 15.0 g/dL   HCT 33.0 (  L) 36.0 - 46.0 %   MCV 86.8 80.0 - 100.0 fL   MCH 29.2 26.0 - 34.0 pg   MCHC 33.6 30.0 - 36.0 g/dL   RDW 14.3 11.5 - 15.5 %   Platelets 328 150 - 400 K/uL   nRBC 0.0 0.0 - 0.2 %    Comment: Performed at [facility], [place] [address] [place], [place] [address]  Urinalysis, Routine w reflex microscopic     Status: Abnormal   Collection Time: 03/30/20  9:51 PM  Result Value Ref Range   Color, Urine AMBER (A) YELLOW    Comment: BIOCHEMICALS MAY BE AFFECTED BY COLOR   APPearance CLOUDY (A) CLEAR   Specific Gravity, Urine 1.024 1.005 - 1.030   pH 5.0 5.0 - 8.0   Glucose, UA NEGATIVE NEGATIVE mg/dL   Hgb urine dipstick LARGE (A) NEGATIVE   Bilirubin Urine NEGATIVE NEGATIVE   Ketones, ur NEGATIVE NEGATIVE mg/dL   Protein, ur 100 (A) NEGATIVE mg/dL   Nitrite NEGATIVE NEGATIVE   Leukocytes,Ua NEGATIVE NEGATIVE   RBC / HPF >50 (H) 0 - 5 RBC/hpf   Bacteria, UA NONE SEEN NONE SEEN   Mucus PRESENT     Budding Yeast PRESENT    Crystals PRESENT (A) NEGATIVE    Comment: Performed at [facility], [place] [address] [place], [place] [address]  I-Stat beta hCG blood, ED     Status: None   Collection Time: 03/30/20  9:57 PM  Result Value Ref Range   I-stat hCG, quantitative <5.0 <5 mIU/mL   Comment 3            Comment:   GEST. AGE      CONC.  (mIU/mL)   <=1 WEEK        5 - 50     2 WEEKS       50 - 500     3 WEEKS       100 - 10,000     4 WEEKS     1,000 - 30,000        FEMALE AND NON-PREGNANT FEMALE:     LESS THAN 5 mIU/mL   Triglycerides     Status: None   Collection Time: 03/31/20 12:40 AM  Result Value Ref Range   Triglycerides 49 <150 mg/dL    Comment: Performed at [facility], [place] [address] [place], [place] [address]  SARS CORONAVIRUS 2 (TAT 6-24 HRS) Nasopharyngeal Nasopharyngeal Swab     Status: None   Collection Time: 03/31/20 12:41 AM   Specimen: Nasopharyngeal Swab  Result Value Ref Range   SARS Coronavirus 2 NEGATIVE NEGATIVE    Comment: (NOTE) SARS-CoV-2 target nucleic acids are NOT DETECTED.  The SARS-CoV-2 RNA is generally detectable in upper and lower respiratory specimens during the acute phase of infection. Negative results do not preclude SARS-CoV-2 infection, do not rule out co-infections with other pathogens, and should not be used as the sole basis for treatment or other patient management decisions. Negative results must be combined with clinical observations, patient history, and epidemiological information. The expected result is Negative.  Fact Sheet for Patients: SugarRoll.be  Fact Sheet for Healthcare Providers: https://www.woods-mathews.com/  This test is not yet approved or cleared by the [place] FDA and  has been authorized for detection and/or diagnosis of SARS-CoV-2 by FDA under an Emergency Use Authorization (EUA). This EUA will remain  in effect (meaning  this test can be used) for the duration of the  COVID-19 declaration under Se ction 564(b)(1) of the Act, 21 U.S.C. section 360bbb-3(b)(1), unless the authorization is terminated or revoked sooner.  Performed at [facility], [place] [address] [place], [place] [address]   HIV Antibody (routine testing w rflx)     Status: None   Collection Time: 03/31/20  3:08 AM  Result Value Ref Range   HIV Screen 4th Generation wRfx Non Reactive Non Reactive    Comment: Performed at [facility], [place] [address], [place], [place] [address]  Comprehensive metabolic panel     Status: Abnormal   Collection Time: 03/31/20  3:08 AM  Result Value Ref Range   Sodium 138 135 - 145 mmol/L   Potassium 3.4 (L) 3.5 - 5.1 mmol/L   Chloride 106 98 - 111 mmol/L   CO2 23 22 - 32 mmol/L   Glucose, Bld 95 70 - 99 mg/dL    Comment: Glucose reference range applies only to samples taken after fasting for at least 8 hours.   BUN 13 6 - 20 mg/dL   Creatinine, Ser 0.73 0.44 - 1.00 mg/dL   Calcium 8.8 (L) 8.9 - 10.3 mg/dL   Total Protein 7.0 6.5 - 8.1 g/dL   Albumin 3.2 (L) 3.5 - 5.0 g/dL   AST 15 15 - 41 U/L   ALT 11 0 - 44 U/L   Alkaline Phosphatase 57 38 - 126 U/L   Total Bilirubin 0.6 0.3 - 1.2 mg/dL   GFR, Estimated >60 >60 mL/min    Comment: (NOTE) Calculated using the CKD-EPI Creatinine Equation (2021)    Anion gap 9 5 - 15    Comment: Performed at [facility], [place] [address], [place], [place] [address]  CBC     Status: Abnormal   Collection Time: 03/31/20  3:08 AM  Result Value Ref Range   WBC 8.0 4.0 - 10.5 K/uL   RBC 3.47 (L) 3.87 - 5.11 MIL/uL   Hemoglobin 9.9 (L) 12.0 - 15.0 g/dL   HCT 30.3 (L) 36.0 - 46.0 %   MCV 87.3 80.0 - 100.0 fL   MCH 28.5 26.0 - 34.0 pg   MCHC 32.7 30.0 - 36.0 g/dL   RDW 14.4 11.5 - 15.5 %   Platelets 254 150 - 400 K/uL   nRBC 0.0 0.0 - 0.2 %    Comment: Performed at [facility], [place] [address], [place], [place] [address]   Protime-INR     Status: None   Collection Time: 03/31/20  3:08 AM  Result Value Ref Range   Prothrombin Time 14.5 11.4 - 15.2 seconds   INR 1.2 0.8 - 1.2    Comment: (NOTE) INR goal varies based on device and disease states. Performed at [facility], [place] [address] [place], [place] [address]   Magnesium     Status: None   Collection Time: 03/31/20  3:08 AM  Result Value Ref Range   Magnesium 1.8 1.7 - 2.4 mg/dL    Comment: Performed at [facility], [place] [address], [place],  [address]    CT ABDOMEN PELVIS W CONTRAST  Result Date: 03/31/2020 CLINICAL DATA:  Supraumbilical abdominal pain EXAM: CT ABDOMEN AND PELVIS WITH CONTRAST TECHNIQUE: Multidetector CT imaging of the abdomen and pelvis was performed using the standard protocol following bolus administration of intravenous contrast. CONTRAST:  112mL OMNIPAQUE IOHEXOL 300 MG/ML  SOLN COMPARISON:  09/10/2012 FINDINGS: Lower chest: No acute pleural or parenchymal lung disease. Hepatobiliary: No  focal liver abnormality is seen. No gallstones, gallbladder wall thickening, or biliary dilatation. Pancreas: There is abnormal decreased enhancement of the uncinate process of the pancreas. Multiple small loculated fluid collections are seen along the uncinate process of the pancreas and extending inferiorly, measuring up to 1.2 cm in greatest dimension. Findings are consistent with pancreatitis with multiple small pseudocysts or abscesses. These are not amenable to percutaneous drainage. The remainder of the pancreatic parenchyma enhances normally. Spleen: Normal in size without focal abnormality. Adrenals/Urinary Tract: Adrenal glands are unremarkable. Kidneys are normal, without renal calculi, focal lesion, or hydronephrosis. Bladder is unremarkable. Stomach/Bowel: No bowel obstruction or ileus. Moderate gas and stool throughout the colon. The appendix, if still present, is not well visualized. No bowel  wall thickening or inflammatory change. Vascular/Lymphatic: Aortic atherosclerosis. No enlarged abdominal or pelvic lymph nodes. Reproductive: The uterus and left adnexa are unremarkable. The right adnexa is enlarged measuring up to 4.8 x 7.3 by 5.6 cm. Multiple rounded hyperdense structures within the right adnexa are noted, measuring up to 4.4 cm in size. These could reflect hemorrhagic ovarian cyst. Ultrasound could be considered for further characterization. Other: Trace pelvic free fluid is likely physiologic. There is no free intraperitoneal gas. No abdominal wall hernia. Musculoskeletal: No acute or destructive bony lesions. Reconstructed images demonstrate no additional findings. IMPRESSION: 1. Abnormal decreased enhancement of the uncinate process of the pancreas, suspicious for pancreatitis. There are multiple small rounded fluid collections along the inferior margin of the uncinate process, measuring up to 1.2 cm in size, suspicious for multiple small loculated pseudocyst or abscesses. Given location and small size, these are not amenable to percutaneous drainage. 2. Enlarged right adnexa, with multiple hyperdense rounded areas likely representing hemorrhagic ovarian cysts. If further evaluation is desired, ultrasound could be considered. 3.  Aortic Atherosclerosis (ICD10-I70.0). Electronically Signed   By: Randa Ngo M.D.   On: 03/31/2020 00:14   US PELVIC COMPLETE W TRANSVAGINAL AND TORSION R/O  Result Date: 03/31/2020 CLINICAL DATA:  Evaluate right ovary.  Abnormal CT. EXAM: TRANSABDOMINAL AND TRANSVAGINAL ULTRASOUND OF PELVIS DOPPLER ULTRASOUND OF OVARIES TECHNIQUE: Both transabdominal and transvaginal ultrasound examinations of the pelvis were performed. Transabdominal technique was performed for global imaging of the pelvis including uterus, ovaries, adnexal regions, and pelvic cul-de-sac. It was necessary to proceed with endovaginal exam following the transabdominal exam to visualize the  ovaries. Color and duplex Doppler ultrasound was utilized to evaluate blood flow to the ovaries. COMPARISON:  CT 03/30/2020 FINDINGS: Uterus Measurements: 7.7 x 3.8 x 4.8 cm = volume: 74 mL. No fibroids or other mass visualized. Endometrium Thickness: 5 mm.  No focal abnormality visualized. Right ovary Measurements: 7.6 x 4.0 x 7.0 cm = volume: 111 mL. Complex multi-septated cystic mass within the right ovary measuring approximately 6.0 x 4.3 x 7.2 cm. Echogenic nonvascular mural nodules (series 1, images 52 and 59). Multiple echogenic foci throughout the cystic spaces with comet-tail artifact. Left ovary Measurements: 3.6 x 1.3 x 1.8 cm = volume: 4.4 mL. Normal appearance/no adnexal mass. Pulsed Doppler evaluation of both ovaries demonstrates normal low-resistance arterial and venous waveforms. Other findings Small-moderate volume free fluid within the pelvis. IMPRESSION: 1. Negative for adnexal torsion. 2. 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 Other cystic ovarian neoplasm is not excluded. Contrast-enhanced MRI of the pelvis could be performed for confirmation, as clinically indicated. Recommend surgical evaluation based on size as this lesion  is at risk for torsion. 3. Small-moderate free fluid within the pelvis. Electronically Signed   By: Davina Poke D.O.   On: 03/31/2020 12:13    Review of Systems negative except above Blood pressure (!) 149/75, pulse (!) 59, temperature 98.2 F (36.8 C), temperature source Oral, resp. rate 17, height 5\' 11"  (1.803 m), weight 68 kg, SpO2 99 %. Physical Exam vital signs stable afebrile no acute distress in good spirits exam pertinent for very minimal midepigastric discomfort without guarding or rebound otherwise soft abdomen no pedal edema labs and CT reviewed slightly anemic  Assessment/Plan: Abdominal pain with abnormal CT of the  pancreas Plan: We will allow clear liquids and proceed with an MRCP and MRI of the pancreas better delineate the anatomy and you might want to draw autoimmune markers to include the specific one for pancreatitis and will check on tomorrow  [facility] E 03/31/2020, 1:46 PM

## 2020-03-31 NOTE — H&P (Signed)
History and Physical   Mary Ramos:[id number] DOB: 1970-05-17 DOA: 03/30/2020  PCP: Lucianne Lei, MD   Patient coming from: Home  Chief Complaint: Abdominal pain  HPI: Mary Ramos is a 50 y.o. female with medical history significant of depression, hypertension, tobacco use who presents with abdominal pain.  Patient has had 2 weeks of epigastric pain with some associated nausea and vomiting in the past 2 weeks and difficulty tolerating PO.  She has seen her PCP for this and she states she was given some medications to treat for potential gastritis or ulcer.  These medications has not been of help to her.  She is presenting now because the discomfort/pain has been getting in the way of her ability to work.  She denies alcohol use. She also reports hematuria and states she has a urology follow-up for this, but not until may.  She denies fever, chills, chest pain, shortness of breath, constipation, diarrhea.  ED Course: Vital signs in the ED significant for intermittent elevated blood pressure in the 150s.  Lab work-up showed CMP with potassium of 3.4, glucose 127.  CBC showed hemoglobin 11.1 down from baseline of 13.  Lipase normal, respiratory panel flu COVID pending.  Urinalysis with hemoglobin, protein, crystals.  CT abdomen pelvis showed decrease enhancement of the uncinate process suspicious for pancreatitis, multiple small rounded fluid collection suspicious for pseudocyst versus abscess, enlargement of right adnexa's recommendation to consider ultrasound.  Review of Systems: As per HPI otherwise all other systems reviewed and are negative.  Past Medical History:  Diagnosis Date   Hypertension     Past Surgical History:  Procedure Laterality Date   LEEP     NECK SURGERY      Social History  reports that she has been smoking cigarettes. She has been smoking about 0.50 packs per day. She has never used smokeless tobacco. She reports current drug use. Drug: Marijuana. She  reports that she does not drink alcohol.  Allergies  Allergen Reactions   Carbomer Hives    Nickel     Family History  Problem Relation Age of Onset   Lupus Mother    Pancreatic cancer Father   Reviewed on admission  Prior to Admission medications   Medication Sig Start Date End Date Taking? Authorizing Provider  ascorbic acid (VITAMIN C) 100 MG tablet Take by mouth.   Yes [provider]  cetirizine (ZYRTEC) 10 MG tablet Take 10 mg by mouth daily as needed. 02/18/16  Yes [provider]  famotidine (PEPCID) 20 MG tablet Take by mouth. 03/20/20  Yes [provider]  hydrochlorothiazide (HYDRODIURIL) 25 MG tablet Take by mouth. 09/28/18  Yes [provider]  medroxyPROGESTERone (DEPO-PROVERA) 400 MG/ML SUSP injection Inject 400 mg into the muscle once.   Yes [provider]  ondansetron (ZOFRAN-ODT) 4 MG disintegrating tablet Take 4 mg by mouth every 8 (eight) hours as needed. 03/20/20  Yes [provider]  sucralfate (CARAFATE) 1 g tablet Take 1 g by mouth 4 (four) times daily. 03/20/20  Yes [provider]  vitamin B-12 (CYANOCOBALAMIN) 250 MCG tablet Take 250 mcg by mouth daily.   Yes [provider]  cyclobenzaprine (FLEXERIL) 10 MG tablet Take 1 tablet (10 mg total) by mouth 2 (two) times daily as needed for muscle spasms. Patient not taking: No sig reported 08/30/15   Mesner, Corene Cornea, MD  HYDROcodone-acetaminophen (NORCO/VICODIN) 5-325 MG tablet Take 2 tablets by mouth every 4 (four) hours as needed. Patient not taking:  Reported on 03/31/2020 10/15/19   Davonna Belling, MD    Physical Exam: Vitals:   03/30/20 2145 03/30/20 2200 03/30/20 2215 03/30/20 2230  BP: 130/75 132/69  124/77  Pulse:  63 65 67  Resp:    18  Temp:      TempSrc:      SpO2:  99% 100% 98%   Physical Exam Constitutional:      General: She is not in acute distress.    Appearance: Normal appearance.  HENT:     Head: Normocephalic and  atraumatic.     Mouth/Throat:     Mouth: Mucous membranes are moist.     Pharynx: Oropharynx is clear.  Eyes:     Extraocular Movements: Extraocular movements intact.     Pupils: Pupils are equal, round, and reactive to light.  Cardiovascular:     Rate and Rhythm: Normal rate and regular rhythm.     Pulses: Normal pulses.     Heart sounds: Normal heart sounds.  Pulmonary:     Effort: Pulmonary effort is normal. No respiratory distress.     Breath sounds: Normal breath sounds.  Abdominal:     General: Bowel sounds are normal. There is no distension.     Palpations: Abdomen is soft.     Tenderness: There is abdominal tenderness (epigastric).  Musculoskeletal:        General: No swelling or deformity.  Skin:    General: Skin is warm and dry.  Neurological:     General: No focal deficit present.     Mental Status: Mental status is at baseline.     Labs on Admission: I have personally reviewed following labs and imaging studies  CBC: Recent Labs  Lab 03/30/20 2151  WBC 8.5  HGB 11.1*  HCT 33.0*  MCV 86.8  PLT 161    Basic Metabolic Panel: Recent Labs  Lab 03/30/20 2151  NA 138  K 3.4*  CL 105  CO2 22  GLUCOSE 122*  BUN 17  CREATININE 0.84  CALCIUM 9.3    GFR: CrCl cannot be calculated (Unknown ideal weight.).  Liver Function Tests: Recent Labs  Lab 03/30/20 2151  AST 16  ALT 12  ALKPHOS 66  BILITOT 0.5  PROT 8.0  ALBUMIN 3.6    Urine analysis:    Component Value Date/Time   COLORURINE AMBER (A) 03/30/2020 2151   APPEARANCEUR CLOUDY (A) 03/30/2020 2151   LABSPEC 1.024 03/30/2020 2151   PHURINE 5.0 03/30/2020 2151   GLUCOSEU NEGATIVE 03/30/2020 2151   HGBUR LARGE (A) 03/30/2020 2151   BILIRUBINUR NEGATIVE 03/30/2020 2151   KETONESUR NEGATIVE 03/30/2020 2151   PROTEINUR 100 (A) 03/30/2020 2151   UROBILINOGEN 1.0 11/30/2013 1401   NITRITE NEGATIVE 03/30/2020 2151   LEUKOCYTESUR NEGATIVE 03/30/2020 2151    Radiological Exams on  Admission: CT ABDOMEN PELVIS W CONTRAST  Result Date: 03/31/2020 CLINICAL DATA:  Supraumbilical abdominal pain EXAM: CT ABDOMEN AND PELVIS WITH CONTRAST TECHNIQUE: Multidetector CT imaging of the abdomen and pelvis was performed using the standard protocol following bolus administration of intravenous contrast. CONTRAST:  149mL OMNIPAQUE IOHEXOL 300 MG/ML  SOLN COMPARISON:  09/10/2012 FINDINGS: Lower chest: No acute pleural or parenchymal lung disease. Hepatobiliary: No focal liver abnormality is seen. No gallstones, gallbladder wall thickening, or biliary dilatation. Pancreas: There is abnormal decreased enhancement of the uncinate process of the pancreas. Multiple small loculated fluid collections are seen along the uncinate process of the pancreas and extending inferiorly, measuring up to 1.2 cm in greatest  dimension. Findings are consistent with pancreatitis with multiple small pseudocysts or abscesses. These are not amenable to percutaneous drainage. The remainder of the pancreatic parenchyma enhances normally. Spleen: Normal in size without focal abnormality. Adrenals/Urinary Tract: Adrenal glands are unremarkable. Kidneys are normal, without renal calculi, focal lesion, or hydronephrosis. Bladder is unremarkable. Stomach/Bowel: No bowel obstruction or ileus. Moderate gas and stool throughout the colon. The appendix, if still present, is not well visualized. No bowel wall thickening or inflammatory change. Vascular/Lymphatic: Aortic atherosclerosis. No enlarged abdominal or pelvic lymph nodes. Reproductive: The uterus and left adnexa are unremarkable. The right adnexa is enlarged measuring up to 4.8 x 7.3 by 5.6 cm. Multiple rounded hyperdense structures within the right adnexa are noted, measuring up to 4.4 cm in size. These could reflect hemorrhagic ovarian cyst. Ultrasound could be considered for further characterization. Other: Trace pelvic free fluid is likely physiologic. There is no free  intraperitoneal gas. No abdominal wall hernia. Musculoskeletal: No acute or destructive bony lesions. Reconstructed images demonstrate no additional findings. IMPRESSION: 1. Abnormal decreased enhancement of the uncinate process of the pancreas, suspicious for pancreatitis. There are multiple small rounded fluid collections along the inferior margin of the uncinate process, measuring up to 1.2 cm in size, suspicious for multiple small loculated pseudocyst or abscesses. Given location and small size, these are not amenable to percutaneous drainage. 2. Enlarged right adnexa, with multiple hyperdense rounded areas likely representing hemorrhagic ovarian cysts. If further evaluation is desired, ultrasound could be considered. 3.  Aortic Atherosclerosis (ICD10-I70.0). Electronically Signed   By: Randa Ngo M.D.   On: 03/31/2020 00:14   EKG: Not performed.  Assessment/Plan Principal Problem:   Pancreatitis Active Problems:   Essential hypertension   Abnormal finding on CT scan   Hematuria  Abdominal pain Pancreatitis Intra-abdominal fluid collections > Patient presented with 2 weeks of epigastric pain found to have CT findings concerning for pancreatitis (decreased enhancement of uncinate process, fluid collections) though lipase was normal.  Also on CT multiple small fluid collections were noted suspicious for pseudocyst versus abscesses. > This may represent chronic pancreatitis.  She does not have improvement in symptoms may benefit from GI consult. > Denies any alcohol use, no gall stones noted on CT. head family history of pancreatic cancer. - Continue IV fluids - Pain control as needed - Sips with meds, will advance diet as tolerated - Follow-up TGs  Hypertension - Continue home hydrochlorothiazide  Anemia Hematuria > New anemia with hemoglobin of 11.1, down from a baseline of around 13. > She does report hematuria and states she has a follow-up with urology for this > Possible  hemorrhagic cyst in the right adnexa as well > UA did note hemoglobin, protein and crystals - Continue monitor CBC, as we continue to evaluate patient will place on SCDs for now  Hypokalemia > Potassium was 3.4 in ED - Replete with 20 mEq p.o. potassium x2 - Check magnesium - Trend electrolytes  Abnormal CT finding > Enlarged right adnexa noted with cysts possibly representing hemorrhagic cyst. - Recommendation to consider ultrasound on read.  DVT prophylaxis: SCDs Code Status:   Full Family Communication:  None on admission  Disposition Plan:   Patient is from:  Home  Anticipated DC to:  Home  Anticipated DC date:  1 to 3 days  Anticipated DC barriers: None  Consults called:  None Admission status:  Observation, MedSurg  Severity of Illness: The appropriate patient status for this patient is OBSERVATION. Observation status is judged to  be reasonable and necessary in order to provide the required intensity of service to ensure the patient's safety. The patient's presenting symptoms, physical exam findings, and initial radiographic and laboratory data in the context of their medical condition is felt to place them at decreased risk for further clinical deterioration. Furthermore, it is anticipated that the patient will be medically stable for discharge from the hospital within 2 midnights of admission. The following factors support the patient status of observation.   " The patient's presenting symptoms include abdominal pain, nausea, vomiting. " The physical exam findings include epigastric abdominal pain. " The initial radiographic and laboratory data are CT scan with decreased enhancement of the uncinate process suspicious for pancreatitis, small rounded fluid collections representing pseudocyst versus abscesses.  Potassium 3.4, hemoglobin 11.1.  Urinalysis with hemoglobin protein crystals.Marcelyn Bruins MD [facility]  How to contact the [facility] Attending or Consulting  provider [place] or covering provider during after hours [place], for this patient?   1. Check the care team in [facility] and look for a) attending/consulting [facility] provider listed and b) the [facility] team listed 2. Log into [email or web address] and use [place]'s universal password to access. If you do not have the password, please contact the hospital operator. 3. Locate the [facility] provider you are looking for under [facility] and page to a number that you can be directly reached. 4. If you still have difficulty reaching the provider, please page the [facility] (Director on Call) for the Hospitalists listed on amion for assistance.  03/31/2020, 1:21 AM

## 2020-03-31 NOTE — Progress Notes (Signed)
Care started prior to midnight in the emergency room and patient admitted early this morning after midnight by Dr. Neva Seat and I am in current agreement with his assessment and plan.  Additional changes to the plan of care been made accordingly.  The patient is a 50 year old African-American female with a past medical history significant for but not limited to depression, hypertension, tobacco abuse as well as other comorbidities who presented with epigastric and abdominal pain.  Patient has had 2 weeks of epigastric pain associated with some nausea vomiting last 2 weeks and was having difficulty tolerating p.o.  She had seen her PCP for this and was given some medications for potential gastritis or ulcer and these medications are not been affecting her helping her.  She presented now again because of this discomfort and pain has been get inability for her to work.  She denies any alcohol use or also reports some hematuria that she has been having and is going to follow-up with urology for this.  She presented to the ED in the ED she was noted to have intermittently elevated blood pressure.  Lab work-up showed hypokalemia and showed that her hemoglobin dropped down from 13-11.  Lipase level is normal and urinalysis had some blood in it.  CT of the abdomen pelvis showed decreased enhancement of the uncinate process suspicious for pancreatitis with multiple rounded small fluid collection suspicious for pseudocyst versus abscesses and enlargement of the right adnexa.  She is currently going to be admitted and treated for the following but not limited to:  Abdominal pain Acute Pancreatitis Intra-abdominal fluid collections -Patient presented with 2 weeks of epigastric pain found to have CT findings concerning for pancreatitis (decreased enhancement of uncinate process, fluid collections) though lipase was normal. - -On CT Scan showed "Abnormal decreased enhancement of the uncinate process of the pancreas,  suspicious for pancreatitis. There are multiple small rounded fluid collections along the inferior margin of the uncinate process, measuring up to 1.2 cm in size, suspicious for multiple small loculated pseudocyst or abscesses. Given location and small size, these are not amenable to percutaneous drainage."  -This may represent chronic pancreatitis. Will consult GI for further evaluation and recommendations -Denies any alcohol use, no gall stones noted on CT. head family history of pancreatic cancer. -Continue IV fluids with NS at 100 mL/hr; Received 1 Liter of LR and 1 Liter of NS Bolus -C/w Pain control with Hydromorphone 0.5 mg IV q4hprn Moderate Pain Severe Pain; Received 4 mg IV Morphine -C/w Antiemetics with po/IV Ondansetron 4 mg q6hprn Nausea -Sips with meds, will advance diet as tolerated -Follow-up TGs and was 49  Hypertension -Will discontinue Hydrochlorothiazide -Continue to Monitor BP per Protocol -Last BP was 149/81  Normocytic Anemia Hematuria -New anemia with hemoglobin of 11.1, down from a baseline of around 13.  Hemoglobin is further drop now and hemoglobin/hematocrit is now 9.9/30.3 -Urinalysis done showed a cloudy appearance with amber color urine, large hemoglobin on urine dipstick, negative ketones, negative leukocytes, negative nitrites, no bacteria seen, present crystals, greater than 50 RBCs per high-power field -She does report hematuria and states she has a follow-up with urology for this -Possible hemorrhagic cyst in the right adnexa as well -May consider Urology vs OB/GYN evaluation -Continue monitor CBC, as we continue to evaluate patient will place on SCDs for now  Hypokalemia -Potassium was 3.4 in ED -Replete with 20 mEq p.o. potassium x2 and with IV KCl 30 mEq given that he is n.p.o. -Check magnesium and  was 1.8 and will be replete with IV mag sulfate 2 g -Continue to monitor and trend and replete as necessary -Repeat CMP in a.m.  Abnormal CT  finding -CT Abdomen and Pelvis showed "Enlarged right adnexa, with multiple hyperdense rounded areas likely representing hemorrhagic ovarian cysts. If further evaluation is desired, ultrasound could be considered." -Will obtain a Pelvic U/S with Transvaginal Complete  Aortic Atherosclerosis -Noted on CT Scan -Check Lipid Panel in the AM  We will continue to monitor the patient's clinical response to intervention and repeat blood work and imaging in the a.m. follow-up on specialist recommendations as GI has been formally consulted

## 2020-04-01 ENCOUNTER — Inpatient Hospital Stay (HOSPITAL_COMMUNITY): Payer: Medicaid Other

## 2020-04-01 DIAGNOSIS — I10 Essential (primary) hypertension: Secondary | ICD-10-CM | POA: Diagnosis not present

## 2020-04-01 DIAGNOSIS — K8591 Acute pancreatitis with uninfected necrosis, unspecified: Principal | ICD-10-CM

## 2020-04-01 DIAGNOSIS — D649 Anemia, unspecified: Secondary | ICD-10-CM | POA: Diagnosis not present

## 2020-04-01 DIAGNOSIS — R9389 Abnormal findings on diagnostic imaging of other specified body structures: Secondary | ICD-10-CM | POA: Diagnosis not present

## 2020-04-01 LAB — CBC WITH DIFFERENTIAL/PLATELET
Abs Immature Granulocytes: 0.03 10*3/uL (ref 0.00–0.07)
Basophils Absolute: 0 10*3/uL (ref 0.0–0.1)
Basophils Relative: 0 %
Eosinophils Absolute: 0.1 10*3/uL (ref 0.0–0.5)
Eosinophils Relative: 1 %
HCT: 31.5 % — ABNORMAL LOW (ref 36.0–46.0)
Hemoglobin: 10.6 g/dL — ABNORMAL LOW (ref 12.0–15.0)
Immature Granulocytes: 0 %
Lymphocytes Relative: 23 %
Lymphs Abs: 1.7 10*3/uL (ref 0.7–4.0)
MCH: 29 pg (ref 26.0–34.0)
MCHC: 33.7 g/dL (ref 30.0–36.0)
MCV: 86.1 fL (ref 80.0–100.0)
Monocytes Absolute: 0.5 10*3/uL (ref 0.1–1.0)
Monocytes Relative: 7 %
Neutro Abs: 5 10*3/uL (ref 1.7–7.7)
Neutrophils Relative %: 69 %
Platelets: 277 10*3/uL (ref 150–400)
RBC: 3.66 MIL/uL — ABNORMAL LOW (ref 3.87–5.11)
RDW: 14.2 % (ref 11.5–15.5)
WBC: 7.4 10*3/uL (ref 4.0–10.5)
nRBC: 0 % (ref 0.0–0.2)

## 2020-04-01 LAB — COMPREHENSIVE METABOLIC PANEL
ALT: 11 U/L (ref 0–44)
AST: 19 U/L (ref 15–41)
Albumin: 3.4 g/dL — ABNORMAL LOW (ref 3.5–5.0)
Alkaline Phosphatase: 60 U/L (ref 38–126)
Anion gap: 11 (ref 5–15)
BUN: 9 mg/dL (ref 6–20)
CO2: 19 mmol/L — ABNORMAL LOW (ref 22–32)
Calcium: 9.3 mg/dL (ref 8.9–10.3)
Chloride: 110 mmol/L (ref 98–111)
Creatinine, Ser: 0.83 mg/dL (ref 0.44–1.00)
GFR, Estimated: >60 mL/min (ref >60)
Glucose, Bld: 81 mg/dL (ref 70–99)
Potassium: 3.9 mmol/L (ref 3.5–5.1)
Sodium: 140 mmol/L (ref 135–145)
Total Bilirubin: 0.9 mg/dL (ref 0.3–1.2)
Total Protein: 7.3 g/dL (ref 6.5–8.1)

## 2020-04-01 LAB — URINE CULTURE

## 2020-04-01 LAB — PHOSPHORUS: Phosphorus: 3.6 mg/dL (ref 2.5–4.6)

## 2020-04-01 LAB — MAGNESIUM: Magnesium: 2 mg/dL (ref 1.7–2.4)

## 2020-04-01 LAB — LIPASE, BLOOD: Lipase: 31 U/L (ref 11–51)

## 2020-04-01 MED ORDER — GADOBUTROL 1 MMOL/ML IV SOLN
6.0000 mL | Freq: Once | INTRAVENOUS | Status: AC | PRN
  Administered 2020-04-01: 6 mL via INTRAVENOUS

## 2020-04-01 NOTE — Plan of Care (Signed)

## 2020-04-01 NOTE — Progress Notes (Addendum)
PROGRESS NOTE    Mary Ramos  OIN:[id number] DOB: 03-08-1970 DOA: 03/30/2020 PCP: Lucianne Lei, MD   Brief Narrative:  The patient is a 50 year old African-American female with a past medical history significant for but not limited to depression, hypertension, tobacco abuse as well as other comorbidities who presented with epigastric and abdominal pain.  Patient has had 2 weeks of epigastric pain associated with some nausea vomiting last 2 weeks and was having difficulty tolerating p.o.  She had seen her PCP for this and was given some medications for potential gastritis or ulcer and these medications are not been affecting her helping her.  She presented now again because of this discomfort and pain has been get inability for her to work.  She denies any alcohol use or also reports some hematuria that she has been having and is going to follow-up with urology for this.  She presented to the ED in the ED she was noted to have intermittently elevated blood pressure.  Lab work-up showed hypokalemia and showed that her hemoglobin dropped down from 13 -> 11.  Lipase level is normal and urinalysis had some blood in it.  CT of the abdomen pelvis showed decreased enhancement of the uncinate process suspicious for pancreatitis with multiple rounded small fluid collection suspicious for pseudocyst versus abscesses and enlargement of the right adnexa. GI was consulted for her Pancreatitis so they have ordered an MRI of the Abd/MRCP which showed "Acute pancreatitis, which is necrotizing and mildly hemorrhagic in the pancreatic head. No significant biliary or pancreatic duct dilation. CBD diameter 3 mm. Clustered  complex cystic lesions in the inferior pancreatic head, largest 2.6 x 1.5 cm with mildly thickened  enhancing wall and eccentric internal septation, probably evolving pseudocysts." Diet has been advanced to CLD by GI.  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 Other cystic ovarian neoplasm is not excluded. Contrast-enhanced MRI of the pelvis could be performed for confirmation, as clinically indicated. Recommend surgical evaluation based on size as this lesion is at risk for torsion. Small-moderate free fluid within the pelvis." We will order the MRI of the Pelvis with Contrast and also discuss with OB-GYN.  Assessment & Plan:   Principal Problem:   Pancreatitis Active Problems:   Essential hypertension   Abnormal finding on CT scan   Hematuria   Anemia  Abdominal pain Acute Pancreatitis with Necrosis  Intra-abdominal fluid collections likely evolving Pseudocysts  -Patient presented with 2 weeks of epigastric pain found to have CT findings concerning for pancreatitis(decreased enhancement of uncinate process, fluid collections)though lipase was normal. - -On CT Scan showed "Abnormal decreased enhancement of the uncinate process of the pancreas, suspicious for pancreatitis. There are multiple small rounded fluid collections along the inferior margin of the uncinate process, measuring up to 1.2 cm in size, suspicious for multiple small loculated pseudocyst or abscesses. Given location and small size, these are not amenable to percutaneous drainage."  -Lipase Level today was 31  -Thismay representchronic pancreatitis. Will consult GI for further evaluation and recommendations -GI recommending MRI/MRCP and showed "Acute pancreatitis, which is necrotizing and mildly hemorrhagic in the pancreatic head. No significant biliary or pancreatic duct dilation. CBD diameter 3 mm. Clustered  complex cystic lesions in the inferior pancreatic head, largest 2.6 x 1.5 cm with  mildly thickened  enhancing wall and eccentric internal septation, probably evolving  pseudocysts."  -Denies any alcohol use, no gall stones noted on CT.head family history of pancreatic cancer. -Continue IV fluids with NS at 100 mL/hr for now; Received 1 Liter of LR and 1 Liter of NS Bolus -C/w Pain control with Hydromorphone 0.5 mg IV q4hprn Moderate Pain Severe Pain; Received 4 mg IV Morphine -C/w Antiemetics with po/IV Ondansetron 4 mg q6hprn Nausea -Sips with meds, will advance diet as tolerated; Now on CLD and will advance Diet per GI recc's  -Follow-upTGs and was 49  Hypertension -Will discontinue Hydrochlorothiazide -Continue to Monitor BP per Protocol -Last BP was 156/94 -If Necessary will place on PRN Hydralazine   Metabolic Acidosis -Mild -Patient's CO2 is 19, Chloride Level is 110, and AG is 11 -Continue to Monitor and Trend and C/w IVF as above -Repeat CMP in the AM   Normocytic Anemia Hematuria -New anemia with hemoglobin of 11.1, down from a baseline of around 13.  Hemoglobin is further drop now and hemoglobin/hematocrit is now 9.9/30.3 yesterday and today is 10.6/31.5 -Urinalysis done showed a cloudy appearance with amber color urine, large hemoglobin on urine dipstick, negative ketones, negative leukocytes, negative nitrites, no bacteria seen, present crystals, greater than 50 RBCs per high-power field -She does report hematuria and states she has a follow-up with urology for this -Possible hemorrhagic cyst in the right adnexa as well -May consider Urology vs OB/GYN evaluation but will discuss with OB-GYN given findings below; I spoke with Dr. Darron Doom who will evaluate the chart and review the scans  -Continue monitor CBC, as we continue to evaluate patient will place on SCDs for now  Hypokalemia -Potassium was 3.4 in ED and improved to 3.9 -Check magnesium and is now 2.0 -Continue to monitor and trend and replete as necessary -Repeat CMP in a.m.  Abnormal CT finding with concern for a Dermoid Cyst/Endometrioma -CT Abdomen and Pelvis  showed "Enlarged right adnexa, with multiple hyperdense rounded areas likely representing hemorrhagic ovarian cysts. If further evaluation is desired, ultrasound could be considered." -Will obtain a Pelvic U/S with Transvaginal Complete and showed "Negative for adnexal torsion.Complex multi-septated cystic mass within the right ovary measuring up to 7.2 cm with sonographic features most suggestive of a mature cystic teratoma/dermoid cyst. Other cystic ovarian neoplasm is not excluded. Contrast-enhanced MRI of the pelvis could be performed for confirmation, as clinically indicated. Recommend surgical evaluation based on size as this lesion is at risk for torsion. Small-moderate free fluid within the pelvis."  -MRI of the PELVIS at [place] from 04/12/19 reviewed and showed "Within the right adnexa is a 3.3 x 2.8 x 3.8 cm mildly T2 hypointense structure (series 10, image 16; series 9, image 8). Along the medial and superior margin there are rounded areas of more marked T2 signal hypointensity, measuring up to 1.5 x 1.1 cm (series 10, image 13), possibly a T2 dark spot sign. There is no associated fat. These structures all demonstrate mild heterogeneous T1 signal hyperintensity and no significant enhancement.   Normal left ovary. The uterus is unremarkable. There is trace pelvic free fluid.   The visualized portions of the bowel are not obstructed the pelvic vasculature is patent. There is no lymphadenopathy. Unremarkable bladder.   No acute or aggressive bony abnormality.    IMPRESSION:  Complex structure in the right adnexa most likely reflects an endometrioma. Follow-up ultrasound in 3-6 months could be performed to ensure continued stability."  -Will obtain MRI of the  Pelvis with Contrast given that she needs a repeat and discuss with OB-GYN -I spoke with Dr. Darron Doom who will evaluate the chart and make additional recommendations   Aortic Atherosclerosis -Noted on CT Scan -Check Lipid Panel in  the AM  DVT prophylaxis: SCDs Code Status: FULL CODE Family Communication: No family present at bedside  Disposition Plan: Pending further clinical improvement and clearance by GI   Status is: Inpatient  Remains inpatient appropriate because:Unsafe d/c plan, IV treatments appropriate due to intensity of illness or inability to take PO and Inpatient level of care appropriate due to severity of illness   Dispo: The patient is from: Home              Anticipated d/c is to: Home              Patient currently is not medically stable to d/c.   Difficult to place patient No  Consultants:   Gastroenterology   Will discuss with OB-GYN  Procedures: As above  Antimicrobials:  Anti-infectives (From admission, onward)   None        Subjective: Seen and examined at bedside and states that she is feeling better but still having some abdominal pain no nausea or vomiting.  Denies any lightheadedness or dizziness.  No chest pain or shortness of breath.  No other concerns or complaints at this time but continues to have some right adnexal pain intermittently as well.   Objective: Vitals:   03/31/20 1329 03/31/20 2119 04/01/20 0500 04/01/20 0505  BP: (!) 149/75 (!) 149/79  (!) 156/94  Pulse: (!) 59 (!) 54  (!) 58  Resp: 17 16  16   Temp: 98.2 F (36.8 C) 98.9 F (37.2 C)  99.4 F (37.4 C)  TempSrc: Oral Oral    SpO2: 99% 97%  100%  Weight:   68 kg   Height:        Intake/Output Summary (Last 24 hours) at 04/01/2020 1045 Last data filed at 04/01/2020 1045 Gross per 24 hour  Intake 2146.7 ml  Output 200 ml  Net 1946.7 ml   Filed Weights   03/31/20 0200 04/01/20 0500  Weight: 68 kg 68 kg   Examination: Physical Exam:  Constitutional: WN/WD AAF in NAD and appears calm and more comfortable than yesterdday Eyes: Lids and conjunctivae normal, sclerae anicteric  ENMT: External Ears, Nose appear normal. Grossly normal hearing. Neck: Appears normal, supple, no cervical masses,  normal ROM, no appreciable thyromegaly; no JVD Respiratory: Diminished to auscultation bilaterally, no wheezing, rales, rhonchi or crackles. Normal respiratory effort and patient is not tachypenic. No accessory muscle use. Unlabored breathing  Cardiovascular: RRR, no murmurs / rubs / gallops. S1 and S2 auscultated. Abdomen: Soft, Tender, Distended. Bowel sounds positive.  GU: Deferred. Musculoskeletal: No clubbing / cyanosis of digits/nails. No joint deformity upper and lower extremities. Skin: No rashes, lesions, ulcers on a limited skin evaluation. No induration; Warm and dry.  Neurologic: CN 2-12 grossly intact with no focal deficits. Romberg sign and cerebellar reflexes not assessed.  Psychiatric: Normal judgment and insight. Alert and oriented x 3. Normal mood and appropriate affect.   Data Reviewed: I have personally reviewed following labs and imaging studies  CBC: Recent Labs  Lab 03/30/20 2151 03/31/20 0308 04/01/20 0902  WBC 8.5 8.0 7.4  NEUTROABS  --   --  5.0  HGB 11.1* 9.9* 10.6*  HCT 33.0* 30.3* 31.5*  MCV 86.8 87.3 86.1  PLT 328 254 027   Basic Metabolic Panel:  Recent Labs  Lab 03/30/20 2151 03/31/20 0308 04/01/20 0902  NA 138 138 140  K 3.4* 3.4* 3.9  CL 105 106 110  CO2 22 23 19*  GLUCOSE 122* 95 81  BUN 17 13 9   CREATININE 0.84 0.73 0.83  CALCIUM 9.3 8.8* 9.3  MG  --  1.8 2.0  PHOS  --   --  3.6   GFR: Estimated Creatinine Clearance: 89 mL/min (by C-G formula based on SCr of 0.83 mg/dL). Liver Function Tests: Recent Labs  Lab 03/30/20 2151 03/31/20 0308 04/01/20 0902  AST 16 15 19   ALT 12 11 11   ALKPHOS 66 57 60  BILITOT 0.5 0.6 0.9  PROT 8.0 7.0 7.3  ALBUMIN 3.6 3.2* 3.4*   Recent Labs  Lab 03/30/20 2151 04/01/20 0902  LIPASE 41 31   No results for input(s): AMMONIA in the last 168 hours. Coagulation Profile: Recent Labs  Lab 03/31/20 0308  INR 1.2   Cardiac Enzymes: No results for input(s): CKTOTAL, CKMB, CKMBINDEX, TROPONINI  in the last 168 hours. BNP (last 3 results) No results for input(s): PROBNP in the last 8760 hours. HbA1C: No results for input(s): HGBA1C in the last 72 hours. CBG: No results for input(s): GLUCAP in the last 168 hours. Lipid Profile: Recent Labs    03/31/20 0040  TRIG 49   Thyroid Function Tests: No results for input(s): TSH, T4TOTAL, FREET4, T3FREE, THYROIDAB in the last 72 hours. Anemia Panel: No results for input(s): VITAMINB12, FOLATE, FERRITIN, TIBC, IRON, RETICCTPCT in the last 72 hours. Sepsis Labs: No results for input(s): PROCALCITON, LATICACIDVEN in the last 168 hours.  Recent Results (from the past 240 hour(s))  SARS CORONAVIRUS 2 (TAT 6-24 HRS) Nasopharyngeal Nasopharyngeal Swab     Status: None   Collection Time: 03/31/20 12:41 AM   Specimen: Nasopharyngeal Swab  Result Value Ref Range Status   SARS Coronavirus 2 NEGATIVE NEGATIVE Final    Comment: (NOTE) SARS-CoV-2 target nucleic acids are NOT DETECTED.  The SARS-CoV-2 RNA is generally detectable in upper and lower respiratory specimens during the acute phase of infection. Negative results do not preclude SARS-CoV-2 infection, do not rule out co-infections with other pathogens, and should not be used as the sole basis for treatment or other patient management decisions. Negative results must be combined with clinical observations, patient history, and epidemiological information. The expected result is Negative.  Fact Sheet for Patients: SugarRoll.be  Fact Sheet for Healthcare Providers: https://www.woods-mathews.com/  This test is not yet approved or cleared by the [place] FDA and  has been authorized for detection and/or diagnosis of SARS-CoV-2 by FDA under an Emergency Use Authorization (EUA). This EUA will remain  in effect (meaning this test can be used) for the duration of the COVID-19 declaration under Se ction 564(b)(1) of the Act, 21 U.S.C. section  360bbb-3(b)(1), unless the authorization is terminated or revoked sooner.  Performed at [facility], [place] [address] [place], [place] [address]      RN Pressure Injury Documentation:     Estimated body mass index is 20.92 kg/m as calculated from the following:   Height as of this encounter: 5\' 11"  (1.803 m).   Weight as of this encounter: 68 kg.  Malnutrition Type:   Malnutrition Characteristics:   Nutrition Interventions:   Radiology Studies: CT ABDOMEN PELVIS W CONTRAST  Result Date: 03/31/2020 CLINICAL DATA:  Supraumbilical abdominal pain EXAM: CT ABDOMEN AND PELVIS WITH CONTRAST TECHNIQUE: Multidetector CT imaging of the abdomen and pelvis was  performed using the standard protocol following bolus administration of intravenous contrast. CONTRAST:  177mL OMNIPAQUE IOHEXOL 300 MG/ML  SOLN COMPARISON:  09/10/2012 FINDINGS: Lower chest: No acute pleural or parenchymal lung disease. Hepatobiliary: No focal liver abnormality is seen. No gallstones, gallbladder wall thickening, or biliary dilatation. Pancreas: There is abnormal decreased enhancement of the uncinate process of the pancreas. Multiple small loculated fluid collections are seen along the uncinate process of the pancreas and extending inferiorly, measuring up to 1.2 cm in greatest dimension. Findings are consistent with pancreatitis with multiple small pseudocysts or abscesses. These are not amenable to percutaneous drainage. The remainder of the pancreatic parenchyma enhances normally. Spleen: Normal in size without focal abnormality. Adrenals/Urinary Tract: Adrenal glands are unremarkable. Kidneys are normal, without renal calculi, focal lesion, or hydronephrosis. Bladder is unremarkable. Stomach/Bowel: No bowel obstruction or ileus. Moderate gas and stool throughout the colon. The appendix, if still present, is not well visualized. No bowel wall thickening or inflammatory change. Vascular/Lymphatic: Aortic atherosclerosis.  No enlarged abdominal or pelvic lymph nodes. Reproductive: The uterus and left adnexa are unremarkable. The right adnexa is enlarged measuring up to 4.8 x 7.3 by 5.6 cm. Multiple rounded hyperdense structures within the right adnexa are noted, measuring up to 4.4 cm in size. These could reflect hemorrhagic ovarian cyst. Ultrasound could be considered for further characterization. Other: Trace pelvic free fluid is likely physiologic. There is no free intraperitoneal gas. No abdominal wall hernia. Musculoskeletal: No acute or destructive bony lesions. Reconstructed images demonstrate no additional findings. IMPRESSION: 1. Abnormal decreased enhancement of the uncinate process of the pancreas, suspicious for pancreatitis. There are multiple small rounded fluid collections along the inferior margin of the uncinate process, measuring up to 1.2 cm in size, suspicious for multiple small loculated pseudocyst or abscesses. Given location and small size, these are not amenable to percutaneous drainage. 2. Enlarged right adnexa, with multiple hyperdense rounded areas likely representing hemorrhagic ovarian cysts. If further evaluation is desired, ultrasound could be considered. 3.  Aortic Atherosclerosis (ICD10-I70.0). Electronically Signed   By: Randa Ngo M.D.   On: 03/31/2020 00:14   MR 3D Recon At Scanner  Result Date: 03/31/2020 CLINICAL DATA:  Inpatient. Acute pancreatitis. Epigastric abdominal pain, vomiting and hematuria for 2 weeks. History of peptic ulcer disease. EXAM: MRI ABDOMEN WITHOUT AND WITH CONTRAST (INCLUDING MRCP) TECHNIQUE: Multiplanar multisequence MR imaging of the abdomen was performed both before and after the administration of intravenous contrast. Heavily T2-weighted images of the biliary and pancreatic ducts were obtained, and three-dimensional MRCP images were rendered by post processing. CONTRAST:  15mL GADAVIST GADOBUTROL 1 MMOL/ML IV SOLN COMPARISON:  03/31/2020 CT abdomen/pelvis.  FINDINGS: Lower chest: No acute abnormality at the lung bases. Hepatobiliary: Normal liver size and configuration. No hepatic steatosis. No liver mass. Normal gallbladder with no cholelithiasis. No significant intrahepatic or extrahepatic biliary ductal dilatation. Common bile duct diameter 3 mm. No choledocholithiasis. No biliary strictures, masses or beading. Generalized mild periportal edema. Pancreas: Diffuse pancreatic parenchymal edema and thickening, most prominently involving the pancreatic head, with relative sparing of the pancreatic tail. There are patchy regions of pre-contrast T1 hyperintensity in the pancreatic head (series 18/image 74) compatible with mild hemorrhage. There are patchy regions of decreased parenchymal perfusion throughout the pancreatic head. Findings are compatible with acute pancreatitis with necrotizing and mildly hemorrhagic change in the pancreatic head. No significant pancreatic duct dilation (2 mm diameter). No pancreas divisum. There are clustered complex cystic lesions in the inferior pancreatic head, largest 2.6 x 1.5  cm (series 30/image 71) with mildly thickened enhancing wall and thickening eccentric internal septation, probably evolving pseudocysts. Spleen: Normal size. No mass. Adrenals/Urinary Tract: Normal adrenals. No hydronephrosis. Subcentimeter simple bilateral renal cysts. No suspicious renal masses. Stomach/Bowel: Normal non-distended stomach. Moderate gaseous distention of the visualized large bowel. No small bowel dilatation. No bowel wall thickening. Vascular/Lymphatic: Atherosclerotic nonaneurysmal abdominal aorta. Patent portal, splenic, hepatic and renal veins. No pathologically enlarged lymph nodes in the abdomen. Other: No abdominal ascites or focal fluid collection. Musculoskeletal: No aggressive appearing focal osseous lesions. IMPRESSION: 1. Acute pancreatitis, which is necrotizing and mildly hemorrhagic in the pancreatic head. No significant biliary or  pancreatic duct dilation. CBD diameter 3 mm. 2. Clustered complex cystic lesions in the inferior pancreatic head, largest 2.6 x 1.5 cm with mildly thickened enhancing wall and eccentric internal septation, probably evolving pseudocysts. Follow-up MRI abdomen without and with IV contrast recommended in 3 months. 3. No cholelithiasis.  No choledocholithiasis. 4. Generalized mild periportal edema, nonspecific. Electronically Signed   By: Ilona Sorrel M.D.   On: 03/31/2020 17:14   MR ABDOMEN MRCP W WO CONTAST  Result Date: 03/31/2020 CLINICAL DATA:  Inpatient. Acute pancreatitis. Epigastric abdominal pain, vomiting and hematuria for 2 weeks. History of peptic ulcer disease. EXAM: MRI ABDOMEN WITHOUT AND WITH CONTRAST (INCLUDING MRCP) TECHNIQUE: Multiplanar multisequence MR imaging of the abdomen was performed both before and after the administration of intravenous contrast. Heavily T2-weighted images of the biliary and pancreatic ducts were obtained, and three-dimensional MRCP images were rendered by post processing. CONTRAST:  30mL GADAVIST GADOBUTROL 1 MMOL/ML IV SOLN COMPARISON:  03/31/2020 CT abdomen/pelvis. FINDINGS: Lower chest: No acute abnormality at the lung bases. Hepatobiliary: Normal liver size and configuration. No hepatic steatosis. No liver mass. Normal gallbladder with no cholelithiasis. No significant intrahepatic or extrahepatic biliary ductal dilatation. Common bile duct diameter 3 mm. No choledocholithiasis. No biliary strictures, masses or beading. Generalized mild periportal edema. Pancreas: Diffuse pancreatic parenchymal edema and thickening, most prominently involving the pancreatic head, with relative sparing of the pancreatic tail. There are patchy regions of pre-contrast T1 hyperintensity in the pancreatic head (series 18/image 74) compatible with mild hemorrhage. There are patchy regions of decreased parenchymal perfusion throughout the pancreatic head. Findings are compatible with acute  pancreatitis with necrotizing and mildly hemorrhagic change in the pancreatic head. No significant pancreatic duct dilation (2 mm diameter). No pancreas divisum. There are clustered complex cystic lesions in the inferior pancreatic head, largest 2.6 x 1.5 cm (series 30/image 71) with mildly thickened enhancing wall and thickening eccentric internal septation, probably evolving pseudocysts. Spleen: Normal size. No mass. Adrenals/Urinary Tract: Normal adrenals. No hydronephrosis. Subcentimeter simple bilateral renal cysts. No suspicious renal masses. Stomach/Bowel: Normal non-distended stomach. Moderate gaseous distention of the visualized large bowel. No small bowel dilatation. No bowel wall thickening. Vascular/Lymphatic: Atherosclerotic nonaneurysmal abdominal aorta. Patent portal, splenic, hepatic and renal veins. No pathologically enlarged lymph nodes in the abdomen. Other: No abdominal ascites or focal fluid collection. Musculoskeletal: No aggressive appearing focal osseous lesions. IMPRESSION: 1. Acute pancreatitis, which is necrotizing and mildly hemorrhagic in the pancreatic head. No significant biliary or pancreatic duct dilation. CBD diameter 3 mm. 2. Clustered complex cystic lesions in the inferior pancreatic head, largest 2.6 x 1.5 cm with mildly thickened enhancing wall and eccentric internal septation, probably evolving pseudocysts. Follow-up MRI abdomen without and with IV contrast recommended in 3 months. 3. No cholelithiasis.  No choledocholithiasis. 4. Generalized mild periportal edema, nonspecific. Electronically Signed   By: Ilona Sorrel  M.D.   On: 03/31/2020 17:14   US PELVIC COMPLETE W TRANSVAGINAL AND TORSION R/O  Result Date: 03/31/2020 CLINICAL DATA:  Evaluate right ovary.  Abnormal CT. EXAM: TRANSABDOMINAL AND TRANSVAGINAL ULTRASOUND OF PELVIS DOPPLER ULTRASOUND OF OVARIES TECHNIQUE: Both transabdominal and transvaginal ultrasound examinations of the pelvis were performed. Transabdominal  technique was performed for global imaging of the pelvis including uterus, ovaries, adnexal regions, and pelvic cul-de-sac. It was necessary to proceed with endovaginal exam following the transabdominal exam to visualize the ovaries. Color and duplex Doppler ultrasound was utilized to evaluate blood flow to the ovaries. COMPARISON:  CT 03/30/2020 FINDINGS: Uterus Measurements: 7.7 x 3.8 x 4.8 cm = volume: 74 mL. No fibroids or other mass visualized. Endometrium Thickness: 5 mm.  No focal abnormality visualized. Right ovary Measurements: 7.6 x 4.0 x 7.0 cm = volume: 111 mL. Complex multi-septated cystic mass within the right ovary measuring approximately 6.0 x 4.3 x 7.2 cm. Echogenic nonvascular mural nodules (series 1, images 52 and 59). Multiple echogenic foci throughout the cystic spaces with comet-tail artifact. Left ovary Measurements: 3.6 x 1.3 x 1.8 cm = volume: 4.4 mL. Normal appearance/no adnexal mass. Pulsed Doppler evaluation of both ovaries demonstrates normal low-resistance arterial and venous waveforms. Other findings Small-moderate volume free fluid within the pelvis. IMPRESSION: 1. Negative for adnexal torsion. 2. 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 Other cystic ovarian neoplasm is not excluded. Contrast-enhanced MRI of the pelvis could be performed for confirmation, as clinically indicated. Recommend surgical evaluation based on size as this lesion is at risk for torsion. 3. Small-moderate free fluid within the pelvis. Electronically Signed   By: Davina Poke D.O.   On: 03/31/2020 12:13   Scheduled Meds: . sodium chloride flush  3 mL Intravenous Q12H   Continuous Infusions: . sodium chloride 100 mL/hr at 04/01/20 0004    LOS: 1 day   Kerney Elbe, DO [facility] PAGER is on AMION  If 7PM-7AM, please contact night-coverage [email or web address]

## 2020-04-01 NOTE — Progress Notes (Signed)
Assessment Principal Problem:   Pancreatitis Active Problems:   Essential hypertension   Abnormal finding on CT scan   Hematuria   Anemia   Endometrioma of ovary   Plan Patient with history of endometrioma noted on MR 04/12/2019.  At that time it was measuring 3.3 x 2.8 x 3.8.  With another portion measuring 1.5 x 1.1 and was felt to most likely represent an endometrioma. On pelvic sonogram 01/18/2019 the ovary was measuring 4.6 x 3.0 x 3.3. On MR today this is measuring 7.4 x 5.9 x 5.7, and still looks like an endometrioma. Will check Ca1 25  Patient may arrange for outpatient follow-up through her primary care provider who has obviously been imaging this for a while or she can follow-up with a GYN of her choice.  History Patient is a 50 year old female who was admitted with acute pancreatitis.  On imaging was incidentally found to have an adnexal mass.  She underwent pelvic sonography as well as pelvic MR and MR spine suggestive of an endometrioma.  In looking through her records it appears she had a similar endometrioma in March 2021 and December 2020 at an outside [facility] 04/01/2020 5:28 PM   Thank you so much for allowing [place] to participate in the care of this patient.  Please call the attending OB/GYN physician with questions or concerns at [phone] M-F 8a-5p, after hours and on weekend, we can be reached at [phone].

## 2020-04-01 NOTE — Progress Notes (Signed)
Mary Ramos 12:31 PM  Subjective: Patient doing better and wants to eat and we thoroughly discussed her CT findings and she has no new complaints  Objective: Vital signs stable afebrile no acute distress abdomen is softer with decreased tenderness no guarding or rebound no new labs IgG4 pending MRI reviewed  Assessment: Pancreatitis and cyst  Plan: Okay to slowly advance diet consider a trial of pancreatic enzymes will need repeat MRI of the pancreas in 3 months and will need gynecologic follow-up and I am happy to see back as needed or her primary care team can refer her to a [facility] gastroenterologist and she might ultimately need an EUS at some point and please call the unassigned team i.e. the [facility] early next week if any further GI question or problem is needed  [facility]  office [phone] After 5PM or if no answer call [phone]

## 2020-04-02 ENCOUNTER — Telehealth: Payer: Self-pay

## 2020-04-02 DIAGNOSIS — R9389 Abnormal findings on diagnostic imaging of other specified body structures: Secondary | ICD-10-CM | POA: Diagnosis not present

## 2020-04-02 DIAGNOSIS — N801 Endometriosis of ovary: Secondary | ICD-10-CM

## 2020-04-02 DIAGNOSIS — D649 Anemia, unspecified: Secondary | ICD-10-CM | POA: Diagnosis not present

## 2020-04-02 DIAGNOSIS — K8501 Idiopathic acute pancreatitis with uninfected necrosis: Secondary | ICD-10-CM

## 2020-04-02 DIAGNOSIS — N809 Endometriosis, unspecified: Secondary | ICD-10-CM

## 2020-04-02 LAB — LIPID PANEL
Cholesterol: 170 mg/dL (ref 0–200)
HDL: 36 mg/dL — ABNORMAL LOW (ref >40)
LDL Cholesterol: 124 mg/dL — ABNORMAL HIGH (ref 0–99)
Total CHOL/HDL Ratio: 4.7 RATIO
Triglycerides: 50 mg/dL (ref <150)
VLDL: 10 mg/dL (ref 0–40)

## 2020-04-02 LAB — PHOSPHORUS: Phosphorus: 3.4 mg/dL (ref 2.5–4.6)

## 2020-04-02 LAB — COMPREHENSIVE METABOLIC PANEL
ALT: 12 U/L (ref 0–44)
AST: 15 U/L (ref 15–41)
Albumin: 2.9 g/dL — ABNORMAL LOW (ref 3.5–5.0)
Alkaline Phosphatase: 51 U/L (ref 38–126)
Anion gap: 10 (ref 5–15)
BUN: 11 mg/dL (ref 6–20)
CO2: 19 mmol/L — ABNORMAL LOW (ref 22–32)
Calcium: 8.8 mg/dL — ABNORMAL LOW (ref 8.9–10.3)
Chloride: 108 mmol/L (ref 98–111)
Creatinine, Ser: 0.84 mg/dL (ref 0.44–1.00)
GFR, Estimated: >60 mL/min (ref >60)
Glucose, Bld: 101 mg/dL — ABNORMAL HIGH (ref 70–99)
Potassium: 3.8 mmol/L (ref 3.5–5.1)
Sodium: 137 mmol/L (ref 135–145)
Total Bilirubin: 0.6 mg/dL (ref 0.3–1.2)
Total Protein: 6.7 g/dL (ref 6.5–8.1)

## 2020-04-02 LAB — CBC WITH DIFFERENTIAL/PLATELET
Abs Immature Granulocytes: 0.02 10*3/uL (ref 0.00–0.07)
Basophils Absolute: 0 10*3/uL (ref 0.0–0.1)
Basophils Relative: 0 %
Eosinophils Absolute: 0.2 10*3/uL (ref 0.0–0.5)
Eosinophils Relative: 3 %
HCT: 30.7 % — ABNORMAL LOW (ref 36.0–46.0)
Hemoglobin: 10.1 g/dL — ABNORMAL LOW (ref 12.0–15.0)
Immature Granulocytes: 0 %
Lymphocytes Relative: 26 %
Lymphs Abs: 2.1 10*3/uL (ref 0.7–4.0)
MCH: 28.9 pg (ref 26.0–34.0)
MCHC: 32.9 g/dL (ref 30.0–36.0)
MCV: 87.7 fL (ref 80.0–100.0)
Monocytes Absolute: 0.7 10*3/uL (ref 0.1–1.0)
Monocytes Relative: 9 %
Neutro Abs: 4.8 10*3/uL (ref 1.7–7.7)
Neutrophils Relative %: 62 %
Platelets: 277 10*3/uL (ref 150–400)
RBC: 3.5 MIL/uL — ABNORMAL LOW (ref 3.87–5.11)
RDW: 14.3 % (ref 11.5–15.5)
WBC: 7.8 10*3/uL (ref 4.0–10.5)
nRBC: 0 % (ref 0.0–0.2)

## 2020-04-02 LAB — IGG 4: IgG, Subclass 4: 61 mg/dL (ref 2–96)

## 2020-04-02 LAB — MAGNESIUM: Magnesium: 1.8 mg/dL (ref 1.7–2.4)

## 2020-04-02 LAB — LIPASE, BLOOD: Lipase: 34 U/L (ref 11–51)

## 2020-04-02 MED ORDER — ACETAMINOPHEN 325 MG PO TABS: 650.0000 mg | ORAL_TABLET | Freq: Four times a day (QID) | ORAL | 0 refills | Status: DC | PRN

## 2020-04-02 MED ORDER — ONDANSETRON HCL 4 MG PO TABS: 4.0000 mg | ORAL_TABLET | Freq: Four times a day (QID) | ORAL | 0 refills | Status: DC | PRN

## 2020-04-02 MED ORDER — HYDROCODONE-ACETAMINOPHEN 5-325 MG PO TABS: 1.0000 | ORAL_TABLET | Freq: Four times a day (QID) | ORAL | 0 refills | Status: DC | PRN

## 2020-04-02 MED ORDER — MAGNESIUM SULFATE 2 GM/50ML IV SOLN
2.0000 g | Freq: Once | INTRAVENOUS | Status: AC
  Administered 2020-04-02: 2 g via INTRAVENOUS
  Filled 2020-04-02: qty 50

## 2020-04-02 NOTE — Plan of Care (Signed)
Pt ready to DC home with understanding to call and make follow up appointments.

## 2020-04-02 NOTE — Discharge Summary (Signed)
Physician Discharge Summary  Mary Ramos JYN:[id number] DOB: 12-19-1970 DOA: 03/30/2020  PCP: Lucianne Lei, MD  Admit date: 03/30/2020 Discharge date: 04/02/2020  Admitted From: Home Disposition: Home  Recommendations for Outpatient Follow-up:  1. Follow up with PCP in 1-2 weeks 2. Follow up with Gastroenterology within 1-2 weeks and have repeat MRI/MRCP in 3 months 3. Have PCP referral for general surgery evaluation given that no clear etiology of pancreatitis has been identified 4. Follow-up with urology as an outpatient 5. Follow up with Gynecology for Endometrioma  6. Please obtain CMP/CBC, Mag, Phos in one week 7. Please follow up on the following pending results: Ca-125 and IgG 4 Levels  Home Health: No Equipment/Devices: None  Discharge Condition: Stable CODE STATUS: FULL CODE Diet recommendation: Heart Healthy Low Fat Diet  Brief/Interim Summary: The patient is a 50 year old African-American female with a past medical history significant for but not limited to depression, hypertension, tobacco abuse as well as other comorbidities who presented with epigastric and abdominal pain. Patient has had 2 weeks of epigastric pain associated with some nausea vomiting last 2 weeks and was having difficulty tolerating p.o. She had seen her PCP for this and was given some medications for potential gastritis or ulcer and these medications are not been affecting her helping her. She presented now again because of this discomfort and pain has been get inability for her to work. She denies any alcohol use or also reports some hematuria that she has been having and is going to follow-up with urology for this. She presented to the ED in the ED she was noted to have intermittently elevated blood pressure. Lab work-up showed hypokalemia and showed that her hemoglobin dropped down from 13 -> 11. Lipase level is normal and urinalysis had some blood in it. CT of the abdomen pelvis showed decreased  enhancement of the uncinate process suspicious for pancreatitis with multiple rounded small fluid collection suspicious for pseudocyst versus abscesses and enlargement of the right adnexa. GI was consulted for her Pancreatitis so they have ordered an MRI of the Abd/MRCP which showed "Acute pancreatitis, which is necrotizing and mildly hemorrhagic in the pancreatic head. No significant biliary or pancreatic duct dilation. CBD diameter 3 mm. Clustered  complex cystic lesions in the inferior pancreatic head, largest 2.6 x 1.5 cm with mildly thickened  enhancing wall and eccentric internal septation, probably evolving pseudocysts." Diet has been advanced to CLD by GI.  Patient also underwent a Complete U/S of the Pelvis with Transvaginal and Torsion rule out and showed "Negative for adnexal torsion.Complex multi-septated cystic mass within the right ovary measuring up to 7.2 cm with sonographic features most suggestive of a mature cystic teratoma/dermoid cyst. Other cystic ovarian neoplasm is not excluded. Contrast-enhanced MRI of the pelvis could be performed for confirmation, as clinically indicated. Recommend surgical evaluation based on size as this lesion is at risk for torsion. Small-moderate free fluid within the pelvis." We will order the MRI of the Pelvis with Contrast and also discuss with OB-GYN. MRI of the Pelvis showed increased size of her endometrioma that was diagnosed and treated 48 2021 and it was 7.4 x 5.9 x 5.7 cm.  GYN Recommended checking a CA-125 level and following up as an outpatient gynecology for further evaluation.  Patient's symptoms improved and she denies any abdominal pain and tolerating her diet without issues.  She will be sent home on a short course of pain meds and antiemetics.  She will need to follow-up with gastroenterology and the  etiology of her pancreatitis is still unclear but she will need to follow-up and have repeat imaging in 3 months and EUS earlier if symptoms do not  resolve.  She tolerated her diet without issues and was stable for discharge and will need to follow-up with PCP, gynecology, gastroenterology, as well as general surgeon outpatient setting.  Discharge Diagnoses:  Principal Problem:   Pancreatitis Active Problems:   Essential hypertension   Abnormal finding on CT scan   Hematuria   Anemia   Endometrioma of ovary   Abdominal pain AcutePancreatitis with Necrosis  Intra-abdominal fluid collections likely evolving Pseudocysts  -Patient presented with 2 weeks of epigastric pain found to have CT findings concerning for pancreatitis(decreased enhancement of uncinate process, fluid collections)though lipase was normal.- -On CT Scan showed "Abnormal decreased enhancement of the uncinate process of the pancreas, suspicious for pancreatitis. There are multiple small rounded fluid collections along the inferior margin of the uncinate process, measuring up to 1.2 cm in size, suspicious for multiple small loculated pseudocyst or abscesses. Given location and small size, these are not amenable to percutaneous drainage."  -Lipase Level today was 31  -Thismay representchronic pancreatitis.Will consult GI for further evaluation and recommendations -GI recommending MRI/MRCP and showed "Acute pancreatitis, which is necrotizing and mildly hemorrhagic in the pancreatic head. No significant biliary or pancreatic duct dilation. CBD diameter 3 mm. Clustered  complex cystic lesions in the inferior pancreatic head, largest 2.6 x 1.5 cm with mildly thickened  enhancing wall and eccentric internal septation, probably evolving pseudocysts."  -Denies any alcohol use, no gall stones noted on CT.head family history of pancreatic cancer. -Continue IV fluidswith NS at 100 mL/hr for now; Received 1 Liter of LR and 1 Liter of NS Bolus -C/wPain controlwith Hydromorphone 0.5 mg IV q4hprn Moderate Pain Severe Pain; Received 4 mg IV Morphine -C/w Antiemetics with po/IV  Ondansetron 4 mg q6hprn Nausea -Sips with meds, will advance diet as tolerated; Now on CLD and will advance Diet per GI recc's  and is now on a soft diet and tolerating this -Follow-upTGsand was 49 -GI evaluated prior to discharge the pancreas in 3 months and have the patient follow-up in 3 to 4 weeks and have recommended discharging home today given that the patient is clinically improving  Hypertension -Will discontinue Hydrochlorothiazide -Continue to Monitor BP per Protocol -Last BP was 135/89 -If Necessary will place on PRN Hydralazine   Metabolic Acidosis -Mild -Patient's CO2 is 19, Chloride Level is 108, and AG is 10 -Continue to Monitor and Trend and C/w IVF as above -Repeat CMP in the AM   NormocyticAnemia Hematuria -New anemia with hemoglobin of 11.1, down from a baseline of around 13.Hemoglobin is further drop now and hemoglobin/hematocrit is now 10.1/30.7 relatively stable -Urinalysis done showed a cloudy appearance with amber color urine, large hemoglobin on urine dipstick, negative ketones, negative leukocytes, negative nitrites, no bacteria seen, present crystals, greater than 50 RBCs per high-power field -She does report hematuria and states she has a follow-up with urology for this -Possible hemorrhagic cyst in the right adnexa as well -May consider Urology vs OB/GYN evaluation but will discuss with OB-GYN given findings below; I spoke with Dr. Darron Doom who will evaluate the chart and review the scans  -Continue monitor CBC, as we continue to evaluate patient will place on SCDs for now -Follow-up with urology for hematuria but likely is the setting of her endometrioma  Hypokalemia -Potassium was 3.4 in ED and improved to 3.8 -Check magnesiumand is now  1.8 and will replete prior to discharge -Continue to monitor and trend and replete as necessary -Repeat CMP in a.m.  Abnormal CT finding with concern for an Endometrioma -CT Abdomen and Pelvis showed  "Enlarged right adnexa, with multiple hyperdense rounded areas likely representing hemorrhagic ovarian cysts. If further evaluation is desired, ultrasound could be considered." -Will obtain a Pelvic U/S with Transvaginal Complete and showed "Negative for adnexal torsion.Complex multi-septated cystic mass within the right ovary measuring up to 7.2 cm with sonographic features most suggestive of a mature cystic teratoma/dermoid cyst. Other cystic ovarian neoplasm is not excluded. Contrast-enhanced MRI of the pelvis could be performed for confirmation, as clinically indicated. Recommend surgical evaluation based on size as this lesion is at risk for torsion. Small-moderate free fluid within the pelvis."  -MRI of the PELVIS at [facility] from 04/12/19 reviewed and showed "Within the right adnexa is a 3.3 x 2.8 x 3.8 cm mildly T2 hypointense structure (series 10, image 16; series 9, image 8). Along the medial and superior margin there are rounded areas of more marked T2 signal hypointensity, measuring up to 1.5 x 1.1 cm (series 10, image 13), possibly a T2 dark spot sign. There is no associated fat. These structures all demonstrate mild heterogeneous T1 signal hyperintensity and no significant enhancement.   Normal left ovary. The uterus is unremarkable. There is trace pelvic free fluid.   The visualized portions of the bowel are not obstructed the pelvic vasculature is patent. There is no lymphadenopathy. Unremarkable bladder.   No acute or aggressive bony abnormality.    IMPRESSION:  Complex structure in the right adnexa most likely reflects an endometrioma. Follow-up ultrasound in 3-6 months could be performed to ensure continued stability." -Will obtain MRI of the Pelvis with Contrast given that she needs a repeat and discuss with OB-GYN -Repeat MRI of the pelvis showed "Right ovary prominently enlarged by clustered similar nonenhancing  masses with MRI features most suggestive of subjacent endometriomas,  measuring 5.5 cm, 4.0 cm and 3.9 cm respectively. GYN consultation suggested. Close ultrasound or MRI pelvis follow-up advised in 3-6 months if not surgically resected. 2. Nonspecific small volume free fluid in the pelvic cul-de-sac, probably reactive/inflammatory related to known acute pancreatitis. 3. Normal uterus and left ovary. -I spoke with Dr. Darron Doom who will evaluate the chart and make additional recommendations; Dr. March Rummage ordered a CA-125 and recommend outpatient GYN consultation for surgical evaluation.  Aortic Atherosclerosis -Noted on CT Scan -Check Lipid Panel in the AM   Discharge Instructions  Discharge Instructions    Call MD for:  difficulty breathing, headache or visual disturbances   Complete by: As directed    Call MD for:  extreme fatigue   Complete by: As directed    Call MD for:  hives   Complete by: As directed    Call MD for:  persistant dizziness or light-headedness   Complete by: As directed    Call MD for:  persistant nausea and vomiting   Complete by: As directed    Call MD for:  redness, tenderness, or signs of infection (pain, swelling, redness, odor or green/yellow discharge around incision site)   Complete by: As directed    Call MD for:  severe uncontrolled pain   Complete by: As directed    Call MD for:  temperature >100.4   Complete by: As directed    Diet - low sodium heart healthy   Complete by: As directed    Discharge instructions   Complete by:  As directed    You were cared for by a hospitalist during your hospital stay. If you have any questions about your discharge medications or the care you received while you were in the hospital after you are discharged, you can call the unit and ask to speak with the hospitalist on call if the hospitalist that took care of you is not available. Once you are discharged, your primary care physician will handle any further medical issues. Please note that NO REFILLS for any discharge medications will  be authorized once you are discharged, as it is imperative that you return to your primary care physician (or establish a relationship with a primary care physician if you do not have one) for your aftercare needs so that they can reassess your need for medications and monitor your lab values.  Follow up with PCP, GI, and OB-GYN as an outpatient. Take all medications as prescribed. If symptoms change or worsen please return to the ED for evaluation   Increase activity slowly   Complete by: As directed      Allergies as of 04/02/2020      Reactions   Carbomer Hives   Nickel       Medication List    STOP taking these medications   cyclobenzaprine 10 MG tablet Commonly known as: FLEXERIL   hydrochlorothiazide 25 MG tablet Commonly known as: HYDRODIURIL   ondansetron 4 MG disintegrating tablet Commonly known as: ZOFRAN-ODT     TAKE these medications   acetaminophen 325 MG tablet Commonly known as: TYLENOL Take 2 tablets (650 mg total) by mouth every 6 (six) hours as needed for mild pain (or Fever >/= 101).   ascorbic acid 100 MG tablet Commonly known as: VITAMIN C Take by mouth.   cetirizine 10 MG tablet Commonly known as: ZYRTEC Take 10 mg by mouth daily as needed.   famotidine 20 MG tablet Commonly known as: PEPCID Take by mouth.   HYDROcodone-acetaminophen 5-325 MG tablet Commonly known as: NORCO/VICODIN Take 1 tablet by mouth every 6 (six) hours as needed. What changed:   how much to take  when to take this   medroxyPROGESTERone 400 MG/ML Susp injection Commonly known as: DEPO-PROVERA Inject 400 mg into the muscle once.   ondansetron 4 MG tablet Commonly known as: ZOFRAN Take 1 tablet (4 mg total) by mouth every 6 (six) hours as needed for nausea.   sucralfate 1 g tablet Commonly known as: CARAFATE Take 1 g by mouth 4 (four) times daily.   vitamin B-12 250 MCG tablet Commonly known as: CYANOCOBALAMIN Take 250 mcg by mouth daily.       Follow-up  Information    Megan Salon, MD Follow up.   Specialty: Gynecology Why: Follow up within 1-2 weeks. If needed, can also confer with primary and or her own GYN physician Contact information: [address] [place] [address] [phone]        Lucianne Lei, MD. Call.   Specialty: Family Medicine Why: Follow up with PCP within 1-2 weeks Contact information: [address] [place] [address] [phone]        [facility]. Call.   Specialty: Gastroenterology Why: Follow up within 1-2 weeks and have repeat Imaging in 1 month Contact information: [place] [address] [phone]             Allergies  Allergen Reactions  . Carbomer Hives    Nickel    Consultations:  Gastroenterology  Procedures/Studies: MR PELVIS W WO CONTRAST  Result Date: 04/01/2020 CLINICAL DATA:  50 year old female inpatient admitted with acute pancreatitis. Incidentally detected indeterminate right adnexal mass. EXAM: MRI PELVIS WITHOUT AND WITH CONTRAST TECHNIQUE: Multiplanar multisequence MR imaging of the pelvis was performed both before and after administration of intravenous contrast. CONTRAST:  1mL GADAVIST GADOBUTROL 1 MMOL/ML IV SOLN COMPARISON:  03/31/2020 CT abdomen/pelvis and pelvic sonogram. FINDINGS: Urinary Tract:  Normal bladder.  Normal urethra. Bowel: No dilated pelvic small bowel loops. Mild-to-moderate gas and stool in visualized large bowel. No bowel wall thickening. Vascular/Lymphatic: No pathologically enlarged lymph nodes in the pelvis. No acute vascular abnormality. Reproductive: Uterus: The anteverted uterus measures 7.6 x 4.3 x 4.6 cm. No uterine fibroids. Inner myometrium (junctional zone) measures 2 mm in thickness, which is within normal limits. Endometrium measures 2 mm in bilayer thickness, which is within normal limits. No endometrial cavity fluid or focal endometrial mass. Normal uterine  cervix. Ovaries and Adnexa: The right ovary is prominently enlarged (overall right ovarian dimensions 7.4 x 5.9 x 5.7 cm) by multiple clustered similar masses demonstrating fairly homogeneous variable T2 shading and T1 hyperintensity with no wall thickening or enhancing soft tissue components, most suggestive of multiple subjacent right ovarian endometriomas measuring 5.5 x 4.5 x 4.2 cm posteriorly (series 26/image 14), 4.0 x 3.0 x 3.2 cm anteriorly (series 26/image 15) and 3.9 x 3.2 x 2.9 cm medially (series 26/image 18). No evidence of internal fat in the right adnexal masses on chemical shift imaging. The left ovary measures 3.4 x 1.6 x X 2.0 cm and is normal. No left adnexal masses. Other: Small volume free fluid in the pelvic cul-de-sac. Musculoskeletal: No aggressive appearing focal osseous lesions. IMPRESSION: 1. Right ovary prominently enlarged by clustered similar nonenhancing masses with MRI features most suggestive of subjacent endometriomas, measuring 5.5 cm, 4.0 cm and 3.9 cm respectively. GYN consultation suggested. Close ultrasound or MRI pelvis follow-up advised in 3-6 months if not surgically resected. 2. Nonspecific small volume free fluid in the pelvic cul-de-sac, probably reactive/inflammatory related to known acute pancreatitis. 3. Normal uterus and left ovary. Electronically Signed   By: Ilona Sorrel M.D.   On: 04/01/2020 16:18   CT ABDOMEN PELVIS W CONTRAST  Result Date: 03/31/2020 CLINICAL DATA:  Supraumbilical abdominal pain EXAM: CT ABDOMEN AND PELVIS WITH CONTRAST TECHNIQUE: Multidetector CT imaging of the abdomen and pelvis was performed using the standard protocol following bolus administration of intravenous contrast. CONTRAST:  154mL OMNIPAQUE IOHEXOL 300 MG/ML  SOLN COMPARISON:  09/10/2012 FINDINGS: Lower chest: No acute pleural or parenchymal lung disease. Hepatobiliary: No focal liver abnormality is seen. No gallstones, gallbladder wall thickening, or biliary dilatation.  Pancreas: There is abnormal decreased enhancement of the uncinate process of the pancreas. Multiple small loculated fluid collections are seen along the uncinate process of the pancreas and extending inferiorly, measuring up to 1.2 cm in greatest dimension. Findings are consistent with pancreatitis with multiple small pseudocysts or abscesses. These are not amenable to percutaneous drainage. The remainder of the pancreatic parenchyma enhances normally. Spleen: Normal in size without focal abnormality. Adrenals/Urinary Tract: Adrenal glands are unremarkable. Kidneys are normal, without renal calculi, focal lesion, or hydronephrosis. Bladder is unremarkable. Stomach/Bowel: No bowel obstruction or ileus. Moderate gas and stool throughout the colon. The appendix, if still present, is not well visualized. No bowel wall thickening or inflammatory change. Vascular/Lymphatic: Aortic atherosclerosis. No enlarged abdominal or pelvic lymph nodes. Reproductive: The uterus and left adnexa are unremarkable. The right adnexa is  enlarged measuring up to 4.8 x 7.3 by 5.6 cm. Multiple rounded hyperdense structures within the right adnexa are noted, measuring up to 4.4 cm in size. These could reflect hemorrhagic ovarian cyst. Ultrasound could be considered for further characterization. Other: Trace pelvic free fluid is likely physiologic. There is no free intraperitoneal gas. No abdominal wall hernia. Musculoskeletal: No acute or destructive bony lesions. Reconstructed images demonstrate no additional findings. IMPRESSION: 1. Abnormal decreased enhancement of the uncinate process of the pancreas, suspicious for pancreatitis. There are multiple small rounded fluid collections along the inferior margin of the uncinate process, measuring up to 1.2 cm in size, suspicious for multiple small loculated pseudocyst or abscesses. Given location and small size, these are not amenable to percutaneous drainage. 2. Enlarged right adnexa, with  multiple hyperdense rounded areas likely representing hemorrhagic ovarian cysts. If further evaluation is desired, ultrasound could be considered. 3.  Aortic Atherosclerosis (ICD10-I70.0). Electronically Signed   By: Randa Ngo M.D.   On: 03/31/2020 00:14   MR 3D Recon At Scanner  Result Date: 03/31/2020 CLINICAL DATA:  Inpatient. Acute pancreatitis. Epigastric abdominal pain, vomiting and hematuria for 2 weeks. History of peptic ulcer disease. EXAM: MRI ABDOMEN WITHOUT AND WITH CONTRAST (INCLUDING MRCP) TECHNIQUE: Multiplanar multisequence MR imaging of the abdomen was performed both before and after the administration of intravenous contrast. Heavily T2-weighted images of the biliary and pancreatic ducts were obtained, and three-dimensional MRCP images were rendered by post processing. CONTRAST:  83mL GADAVIST GADOBUTROL 1 MMOL/ML IV SOLN COMPARISON:  03/31/2020 CT abdomen/pelvis. FINDINGS: Lower chest: No acute abnormality at the lung bases. Hepatobiliary: Normal liver size and configuration. No hepatic steatosis. No liver mass. Normal gallbladder with no cholelithiasis. No significant intrahepatic or extrahepatic biliary ductal dilatation. Common bile duct diameter 3 mm. No choledocholithiasis. No biliary strictures, masses or beading. Generalized mild periportal edema. Pancreas: Diffuse pancreatic parenchymal edema and thickening, most prominently involving the pancreatic head, with relative sparing of the pancreatic tail. There are patchy regions of pre-contrast T1 hyperintensity in the pancreatic head (series 18/image 74) compatible with mild hemorrhage. There are patchy regions of decreased parenchymal perfusion throughout the pancreatic head. Findings are compatible with acute pancreatitis with necrotizing and mildly hemorrhagic change in the pancreatic head. No significant pancreatic duct dilation (2 mm diameter). No pancreas divisum. There are clustered complex cystic lesions in the inferior  pancreatic head, largest 2.6 x 1.5 cm (series 30/image 71) with mildly thickened enhancing wall and thickening eccentric internal septation, probably evolving pseudocysts. Spleen: Normal size. No mass. Adrenals/Urinary Tract: Normal adrenals. No hydronephrosis. Subcentimeter simple bilateral renal cysts. No suspicious renal masses. Stomach/Bowel: Normal non-distended stomach. Moderate gaseous distention of the visualized large bowel. No small bowel dilatation. No bowel wall thickening. Vascular/Lymphatic: Atherosclerotic nonaneurysmal abdominal aorta. Patent portal, splenic, hepatic and renal veins. No pathologically enlarged lymph nodes in the abdomen. Other: No abdominal ascites or focal fluid collection. Musculoskeletal: No aggressive appearing focal osseous lesions. IMPRESSION: 1. Acute pancreatitis, which is necrotizing and mildly hemorrhagic in the pancreatic head. No significant biliary or pancreatic duct dilation. CBD diameter 3 mm. 2. Clustered complex cystic lesions in the inferior pancreatic head, largest 2.6 x 1.5 cm with mildly thickened enhancing wall and eccentric internal septation, probably evolving pseudocysts. Follow-up MRI abdomen without and with IV contrast recommended in 3 months. 3. No cholelithiasis.  No choledocholithiasis. 4. Generalized mild periportal edema, nonspecific. Electronically Signed   By: Ilona Sorrel M.D.   On: 03/31/2020 17:14   MR ABDOMEN MRCP W WO  CONTAST  Result Date: 03/31/2020 CLINICAL DATA:  Inpatient. Acute pancreatitis. Epigastric abdominal pain, vomiting and hematuria for 2 weeks. History of peptic ulcer disease. EXAM: MRI ABDOMEN WITHOUT AND WITH CONTRAST (INCLUDING MRCP) TECHNIQUE: Multiplanar multisequence MR imaging of the abdomen was performed both before and after the administration of intravenous contrast. Heavily T2-weighted images of the biliary and pancreatic ducts were obtained, and three-dimensional MRCP images were rendered by post processing.  CONTRAST:  90mL GADAVIST GADOBUTROL 1 MMOL/ML IV SOLN COMPARISON:  03/31/2020 CT abdomen/pelvis. FINDINGS: Lower chest: No acute abnormality at the lung bases. Hepatobiliary: Normal liver size and configuration. No hepatic steatosis. No liver mass. Normal gallbladder with no cholelithiasis. No significant intrahepatic or extrahepatic biliary ductal dilatation. Common bile duct diameter 3 mm. No choledocholithiasis. No biliary strictures, masses or beading. Generalized mild periportal edema. Pancreas: Diffuse pancreatic parenchymal edema and thickening, most prominently involving the pancreatic head, with relative sparing of the pancreatic tail. There are patchy regions of pre-contrast T1 hyperintensity in the pancreatic head (series 18/image 74) compatible with mild hemorrhage. There are patchy regions of decreased parenchymal perfusion throughout the pancreatic head. Findings are compatible with acute pancreatitis with necrotizing and mildly hemorrhagic change in the pancreatic head. No significant pancreatic duct dilation (2 mm diameter). No pancreas divisum. There are clustered complex cystic lesions in the inferior pancreatic head, largest 2.6 x 1.5 cm (series 30/image 71) with mildly thickened enhancing wall and thickening eccentric internal septation, probably evolving pseudocysts. Spleen: Normal size. No mass. Adrenals/Urinary Tract: Normal adrenals. No hydronephrosis. Subcentimeter simple bilateral renal cysts. No suspicious renal masses. Stomach/Bowel: Normal non-distended stomach. Moderate gaseous distention of the visualized large bowel. No small bowel dilatation. No bowel wall thickening. Vascular/Lymphatic: Atherosclerotic nonaneurysmal abdominal aorta. Patent portal, splenic, hepatic and renal veins. No pathologically enlarged lymph nodes in the abdomen. Other: No abdominal ascites or focal fluid collection. Musculoskeletal: No aggressive appearing focal osseous lesions. IMPRESSION: 1. Acute  pancreatitis, which is necrotizing and mildly hemorrhagic in the pancreatic head. No significant biliary or pancreatic duct dilation. CBD diameter 3 mm. 2. Clustered complex cystic lesions in the inferior pancreatic head, largest 2.6 x 1.5 cm with mildly thickened enhancing wall and eccentric internal septation, probably evolving pseudocysts. Follow-up MRI abdomen without and with IV contrast recommended in 3 months. 3. No cholelithiasis.  No choledocholithiasis. 4. Generalized mild periportal edema, nonspecific. Electronically Signed   By: Ilona Sorrel M.D.   On: 03/31/2020 17:14   US PELVIC COMPLETE W TRANSVAGINAL AND TORSION R/O  Result Date: 03/31/2020 CLINICAL DATA:  Evaluate right ovary.  Abnormal CT. EXAM: TRANSABDOMINAL AND TRANSVAGINAL ULTRASOUND OF PELVIS DOPPLER ULTRASOUND OF OVARIES TECHNIQUE: Both transabdominal and transvaginal ultrasound examinations of the pelvis were performed. Transabdominal technique was performed for global imaging of the pelvis including uterus, ovaries, adnexal regions, and pelvic cul-de-sac. It was necessary to proceed with endovaginal exam following the transabdominal exam to visualize the ovaries. Color and duplex Doppler ultrasound was utilized to evaluate blood flow to the ovaries. COMPARISON:  CT 03/30/2020 FINDINGS: Uterus Measurements: 7.7 x 3.8 x 4.8 cm = volume: 74 mL. No fibroids or other mass visualized. Endometrium Thickness: 5 mm.  No focal abnormality visualized. Right ovary Measurements: 7.6 x 4.0 x 7.0 cm = volume: 111 mL. Complex multi-septated cystic mass within the right ovary measuring approximately 6.0 x 4.3 x 7.2 cm. Echogenic nonvascular mural nodules (series 1, images 52 and 59). Multiple echogenic foci throughout the cystic spaces with comet-tail artifact. Left ovary Measurements: 3.6 x 1.3 x 1.8  cm = volume: 4.4 mL. Normal appearance/no adnexal mass. Pulsed Doppler evaluation of both ovaries demonstrates normal low-resistance arterial and venous  waveforms. Other findings Small-moderate volume free fluid within the pelvis. IMPRESSION: 1. Negative for adnexal torsion. 2. 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 Other cystic ovarian neoplasm is not excluded. Contrast-enhanced MRI of the pelvis could be performed for confirmation, as clinically indicated. Recommend surgical evaluation based on size as this lesion is at risk for torsion. 3. Small-moderate free fluid within the pelvis. Electronically Signed   By: Davina Poke D.O.   On: 03/31/2020 12:13     Subjective: Seen and examined at bedside and patient was doing relatively well and tolerating her diet without much pain.  No nausea or vomiting.  Denies any lightheadedness or dizziness.  No other concerns or complaints at this time and will need to follow-up with PCP, gynecology, gastroenterology, urology as well as general surgery in outpatient setting and she understands agrees with the plan of care.  Discharge Exam: Vitals:   04/02/20 0537 04/02/20 1402  BP: (!) 143/89 135/89  Pulse: (!) 52 62  Resp: 16 15  Temp: 97.9 F (36.6 C) 99.2 F (37.3 C)  SpO2: 96% 100%   Vitals:   04/01/20 1514 04/01/20 2119 04/02/20 0537 04/02/20 1402  BP: (!) 153/88 135/76 (!) 143/89 135/89  Pulse: 66 68 (!) 52 62  Resp: 18 16 16 15   Temp: 98.3 F (36.8 C) 98.1 F (36.7 C) 97.9 F (36.6 C) 99.2 F (37.3 C)  TempSrc: Oral   Oral  SpO2: 100% 98% 96% 100%  Weight:      Height:       General: Pt is alert, awake, not in acute distress Cardiovascular: RRR, S1/S2 +, no rubs, no gallops Respiratory: Diminished bilaterally, no wheezing, no rhonchi Abdominal: Soft, slightly tender, Slightly distended, bowel sounds + Extremities: mild edema, no cyanosis  The results of significant diagnostics from this hospitalization (including imaging, microbiology, ancillary and  laboratory) are listed below for reference.    Microbiology: Recent Results (from the past 240 hour(s))  SARS CORONAVIRUS 2 (TAT 6-24 HRS) Nasopharyngeal Nasopharyngeal Swab     Status: None   Collection Time: 03/31/20 12:41 AM   Specimen: Nasopharyngeal Swab  Result Value Ref Range Status   SARS Coronavirus 2 NEGATIVE NEGATIVE Final    Comment: (NOTE) SARS-CoV-2 target nucleic acids are NOT DETECTED.  The SARS-CoV-2 RNA is generally detectable in upper and lower respiratory specimens during the acute phase of infection. Negative results do not preclude SARS-CoV-2 infection, do not rule out co-infections with other pathogens, and should not be used as the sole basis for treatment or other patient management decisions. Negative results must be combined with clinical observations, patient history, and epidemiological information. The expected result is Negative.  Fact Sheet for Patients: SugarRoll.be  Fact Sheet for Healthcare Providers: https://www.woods-mathews.com/  This test is not yet approved or cleared by the [place] FDA and  has been authorized for detection and/or diagnosis of SARS-CoV-2 by FDA under an Emergency Use Authorization (EUA). This EUA will remain  in effect (meaning this test can be used) for the duration of the COVID-19 declaration under Se ction 564(b)(1) of the Act, 21 U.S.C. section 360bbb-3(b)(1), unless the authorization is terminated or revoked sooner.  Performed at [facility], [place] [address], [place], [place] [address]   Culture, Urine  Status: Abnormal   Collection Time: 03/31/20 11:37 AM   Specimen: Urine, Random  Result Value Ref Range Status   Specimen Description   Final    URINE, RANDOM Performed at [facility] [address] [place], [place] [address]    Special Requests   Final    NONE Performed at [facility], [place] [address],  [place], [place] [address]    Culture MULTIPLE SPECIES PRESENT, SUGGEST RECOLLECTION (A)  Final   Report Status 04/01/2020 FINAL  Final    Labs: BNP (last 3 results) No results for input(s): BNP in the last 8760 hours. Basic Metabolic Panel: Recent Labs  Lab 03/30/20 2151 03/31/20 0308 04/01/20 0902 04/02/20 0309  NA 138 138 140 137  K 3.4* 3.4* 3.9 3.8  CL 105 106 110 108  CO2 22 23 19* 19*  GLUCOSE 122* 95 81 101*  BUN 17 13 9 11   CREATININE 0.84 0.73 0.83 0.84  CALCIUM 9.3 8.8* 9.3 8.8*  MG  --  1.8 2.0 1.8  PHOS  --   --  3.6 3.4   Liver Function Tests: Recent Labs  Lab 03/30/20 2151 03/31/20 0308 04/01/20 0902 04/02/20 0309  AST 16 15 19 15   ALT 12 11 11 12   ALKPHOS 66 57 60 51  BILITOT 0.5 0.6 0.9 0.6  PROT 8.0 7.0 7.3 6.7  ALBUMIN 3.6 3.2* 3.4* 2.9*   Recent Labs  Lab 03/30/20 2151 04/01/20 0902 04/02/20 0309  LIPASE 41 31 34   No results for input(s): AMMONIA in the last 168 hours. CBC: Recent Labs  Lab 03/30/20 2151 03/31/20 0308 04/01/20 0902 04/02/20 0309  WBC 8.5 8.0 7.4 7.8  NEUTROABS  --   --  5.0 4.8  HGB 11.1* 9.9* 10.6* 10.1*  HCT 33.0* 30.3* 31.5* 30.7*  MCV 86.8 87.3 86.1 87.7  PLT 328 254 277 277   Cardiac Enzymes: No results for input(s): CKTOTAL, CKMB, CKMBINDEX, TROPONINI in the last 168 hours. BNP: Invalid input(s): POCBNP CBG: No results for input(s): GLUCAP in the last 168 hours. D-Dimer No results for input(s): DDIMER in the last 72 hours. Hgb A1c No results for input(s): HGBA1C in the last 72 hours. Lipid Profile Recent Labs    03/31/20 0040 04/02/20 0309  CHOL  --  170  HDL  --  36*  LDLCALC  --  124*  TRIG 49 50  CHOLHDL  --  4.7   Thyroid function studies No results for input(s): TSH, T4TOTAL, T3FREE, THYROIDAB in the last 72 hours.  Invalid input(s): FREET3 Anemia work up No results for input(s): VITAMINB12, FOLATE, FERRITIN, TIBC, IRON, RETICCTPCT in the last 72 hours. Urinalysis    Component Value  Date/Time   COLORURINE AMBER (A) 03/30/2020 2151   APPEARANCEUR CLOUDY (A) 03/30/2020 2151   LABSPEC 1.024 03/30/2020 2151   PHURINE 5.0 03/30/2020 2151   GLUCOSEU NEGATIVE 03/30/2020 2151   HGBUR LARGE (A) 03/30/2020 2151   BILIRUBINUR NEGATIVE 03/30/2020 2151   [place] 03/30/2020 2151   PROTEINUR 100 (A) 03/30/2020 2151   UROBILINOGEN 1.0 11/30/2013 1401   NITRITE NEGATIVE 03/30/2020 2151   LEUKOCYTESUR NEGATIVE 03/30/2020 2151   Sepsis Labs Invalid input(s): PROCALCITONIN,  WBC,  LACTICIDVEN Microbiology Recent Results (from the past 240 hour(s))  SARS CORONAVIRUS 2 (TAT 6-24 HRS) Nasopharyngeal Nasopharyngeal Swab     Status: None   Collection Time: 03/31/20 12:41 AM   Specimen: Nasopharyngeal Swab  Result Value Ref Range Status   SARS Coronavirus  2 NEGATIVE NEGATIVE Final    Comment: (NOTE) SARS-CoV-2 target nucleic acids are NOT DETECTED.  The SARS-CoV-2 RNA is generally detectable in upper and lower respiratory specimens during the acute phase of infection. Negative results do not preclude SARS-CoV-2 infection, do not rule out co-infections with other pathogens, and should not be used as the sole basis for treatment or other patient management decisions. Negative results must be combined with clinical observations, patient history, and epidemiological information. The expected result is Negative.  Fact Sheet for Patients: SugarRoll.be  Fact Sheet for Healthcare Providers: https://www.woods-mathews.com/  This test is not yet approved or cleared by the [place] FDA and  has been authorized for detection and/or diagnosis of SARS-CoV-2 by FDA under an Emergency Use Authorization (EUA). This EUA will remain  in effect (meaning this test can be used) for the duration of the COVID-19 declaration under Se ction 564(b)(1) of the Act, 21 U.S.C. section 360bbb-3(b)(1), unless the authorization is terminated or revoked  sooner.  Performed at [facility], [place] [address], [place], [place] [address]   Culture, Urine     Status: Abnormal   Collection Time: 03/31/20 11:37 AM   Specimen: Urine, Random  Result Value Ref Range Status   Specimen Description   Final    URINE, RANDOM Performed at [facility] [address] [place], [place] [address]    Special Requests   Final    NONE Performed at [facility], [place] [address] [place],  [address]    Culture MULTIPLE SPECIES PRESENT, SUGGEST RECOLLECTION (A)  Final   Report Status 04/01/2020 FINAL  Final   Time coordinating discharge: 35 minutes  SIGNED:  Kerney Elbe, DO [facility] 04/02/2020, 8:00 PM Pager is on AMION  If 7PM-7AM, please contact night-coverage [email or web address]

## 2020-04-02 NOTE — Telephone Encounter (Signed)
Lm on vm for patient to return call to schedule a follow up appointment.   Reminder in epic for an MRI/MRCP in 3 months.

## 2020-04-02 NOTE — Telephone Encounter (Signed)
-----   Message from Levin Erp, [place] sent at 04/02/2020  1:23 PM EDT ----- Regarding: Follow up Please set patient up for outpatient follow-up in 3 to 4 weeks for her pancreatitis with cyst.  She will also need an MRI in 3 months.  This can be with either Dr. Tarri Glenn or myself.  Thank you, JLL

## 2020-04-02 NOTE — Progress Notes (Signed)
Progress Note (Just seen over the weekend by [facility], they were covering for [place])  Subjective  Chief Complaint: Pancreatitis  Today, the patient tells me that she is feeling much much better, she had tolerated her scrambled eggs and other solid foods for breakfast this morning.  Tells me that after she eats she has a minimal amount of epigastric pain, but this is "nothing compared to what it was".  Tells me that she is glad to be on the mend.  Mostly patient is worried that she still needs surgery and does not want to be discharged from the hospital if she needs surgery "for my cysts".  There is some confusion about where the cysts are located.  She does ask some questions today.   Objective   Vital signs in last 24 hours: Temp:  [97.9 F (36.6 C)-98.3 F (36.8 C)] 97.9 F (36.6 C) (03/14 0537) Pulse Rate:  [52-68] 52 (03/14 0537) Resp:  [16-18] 16 (03/14 0537) BP: (135-153)/(76-89) 143/89 (03/14 0537) SpO2:  [96 %-100 %] 96 % (03/14 0537) Last BM Date: 03/30/20 General:    AA female in NAD Heart:  Regular rate and rhythm; no murmurs Lungs: Respirations even and unlabored, lungs CTA bilaterally Abdomen:  Soft, mild epigastric TTP and nondistended. Normal bowel sounds. Extremities:  Without edema. Neurologic:  Alert and oriented,  grossly normal neurologically. Psych:  Cooperative. Normal mood and affect.  Intake/Output from previous day: 03/13 0701 - 03/14 0700 In: 3493.1 [P.O.:1440; I.V.:2053.1] Out: -  Intake/Output this shift: Total I/O In: 240 [P.O.:240] Out: -   Lab Results: Recent Labs    03/31/20 0308 04/01/20 0902 04/02/20 0309  WBC 8.0 7.4 7.8  HGB 9.9* 10.6* 10.1*  HCT 30.3* 31.5* 30.7*  PLT 254 277 277   BMET Recent Labs    03/31/20 0308 04/01/20 0902 04/02/20 0309  NA 138 140 137  K 3.4* 3.9 3.8  CL 106 110 108  CO2 23 19* 19*  GLUCOSE 95 81 101*  BUN 13 9 11   CREATININE 0.73 0.83 0.84  CALCIUM 8.8* 9.3 8.8*   LFT Recent Labs     04/02/20 0309  PROT 6.7  ALBUMIN 2.9*  AST 15  ALT 12  ALKPHOS 51  BILITOT 0.6   PT/INR Recent Labs    03/31/20 0308  LABPROT 14.5  INR 1.2    Studies/Results: MR PELVIS W WO CONTRAST  Result Date: 04/01/2020 CLINICAL DATA:  50 year old female inpatient admitted with acute pancreatitis. Incidentally detected indeterminate right adnexal mass. EXAM: MRI PELVIS WITHOUT AND WITH CONTRAST TECHNIQUE: Multiplanar multisequence MR imaging of the pelvis was performed both before and after administration of intravenous contrast. CONTRAST:  57mL GADAVIST GADOBUTROL 1 MMOL/ML IV SOLN COMPARISON:  03/31/2020 CT abdomen/pelvis and pelvic sonogram. FINDINGS: Urinary Tract:  Normal bladder.  Normal urethra. Bowel: No dilated pelvic small bowel loops. Mild-to-moderate gas and stool in visualized large bowel. No bowel wall thickening. Vascular/Lymphatic: No pathologically enlarged lymph nodes in the pelvis. No acute vascular abnormality. Reproductive: Uterus: The anteverted uterus measures 7.6 x 4.3 x 4.6 cm. No uterine fibroids. Inner myometrium (junctional zone) measures 2 mm in thickness, which is within normal limits. Endometrium measures 2 mm in bilayer thickness, which is within normal limits. No endometrial cavity fluid or focal endometrial mass. Normal uterine cervix. 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 No evidence of internal fat in the right adnexal masses on chemical shift imaging. The left ovary measures 3.4 x 1.6 x X 2.0 cm  and is normal. No left adnexal masses. Other: Small volume free fluid in the pelvic cul-de-sac. Musculoskeletal: No aggressive appearing focal osseous lesions. IMPRESSION: 1. Right ovary prominently enlarged by clustered similar nonenhancing masses with MRI features most suggestive of subjacent endometriomas, measuring 5.5 cm, 4.0 cm and 3.9 cm respectively. GYN consultation suggested. Close ultrasound or MRI pelvis follow-up advised in 3-6 months if not surgically resected. 2. Nonspecific small volume free fluid in the pelvic cul-de-sac, probably reactive/inflammatory related to known acute pancreatitis. 3. Normal uterus and left ovary. Electronically Signed   By: Ilona Sorrel M.D.   On: 04/01/2020 16:18   MR 3D Recon At Scanner  Result Date: 03/31/2020 CLINICAL DATA:  Inpatient. Acute pancreatitis. Epigastric abdominal pain, vomiting and hematuria for 2 weeks. History of peptic ulcer disease. EXAM: MRI ABDOMEN WITHOUT AND WITH CONTRAST (INCLUDING MRCP) TECHNIQUE: Multiplanar multisequence MR imaging of the abdomen was performed both before and after the administration of intravenous contrast. Heavily T2-weighted images of the biliary and pancreatic ducts were obtained, and three-dimensional MRCP images were rendered by post processing. CONTRAST:  45mL GADAVIST GADOBUTROL 1 MMOL/ML IV SOLN COMPARISON:  03/31/2020 CT abdomen/pelvis. FINDINGS: Lower chest: No acute abnormality at the lung bases. Hepatobiliary: Normal liver size and configuration. No hepatic steatosis. No liver mass. Normal gallbladder with no cholelithiasis. No significant intrahepatic or extrahepatic biliary ductal dilatation. Common bile duct diameter 3 mm. No choledocholithiasis. No biliary strictures, masses or beading. Generalized mild periportal edema. Pancreas: Diffuse pancreatic parenchymal edema and thickening, most prominently involving the pancreatic head, with relative sparing of the pancreatic tail. There are patchy regions of  pre-contrast T1 hyperintensity in the pancreatic head (series 18/image 74) compatible with mild hemorrhage. There are patchy regions of decreased parenchymal perfusion throughout the pancreatic head. Findings are compatible with acute pancreatitis with necrotizing and mildly hemorrhagic change in the pancreatic head. No significant pancreatic duct dilation (2 mm diameter). No pancreas divisum. There are clustered complex cystic lesions in the inferior pancreatic head, largest 2.6 x 1.5 cm (series 30/image 71) with mildly thickened enhancing wall and thickening eccentric internal septation, probably evolving pseudocysts. Spleen: Normal size. No mass. Adrenals/Urinary Tract: Normal adrenals. No hydronephrosis. Subcentimeter simple bilateral renal cysts. No suspicious renal masses. Stomach/Bowel: Normal non-distended stomach. Moderate gaseous distention of the visualized large bowel. No small bowel dilatation. No bowel wall thickening. Vascular/Lymphatic: Atherosclerotic nonaneurysmal abdominal aorta. Patent portal, splenic, hepatic and renal veins. No pathologically enlarged lymph nodes in the abdomen. Other: No abdominal ascites or focal fluid collection. Musculoskeletal: No aggressive appearing focal osseous lesions. IMPRESSION: 1. Acute pancreatitis, which is necrotizing and mildly hemorrhagic in the pancreatic head. No significant biliary or pancreatic duct dilation. CBD diameter 3 mm. 2. Clustered complex cystic lesions in the inferior pancreatic head, largest 2.6 x 1.5 cm with mildly thickened enhancing wall and eccentric internal septation, probably evolving pseudocysts. Follow-up MRI abdomen without and with IV contrast recommended in 3 months. 3. No cholelithiasis.  No choledocholithiasis. 4. Generalized mild periportal edema, nonspecific. Electronically Signed   By: Ilona Sorrel M.D.   On:  03/31/2020 17:14   MR ABDOMEN MRCP W WO CONTAST  Result Date: 03/31/2020 CLINICAL DATA:  Inpatient. Acute  pancreatitis. Epigastric abdominal pain, vomiting and hematuria for 2 weeks. History of peptic ulcer disease. EXAM: MRI ABDOMEN WITHOUT AND WITH CONTRAST (INCLUDING MRCP) TECHNIQUE: Multiplanar multisequence MR imaging of the abdomen was performed both before and after the administration of intravenous contrast. Heavily T2-weighted images of the biliary and pancreatic ducts were obtained, and three-dimensional MRCP images were rendered by post processing. CONTRAST:  54mL GADAVIST GADOBUTROL 1 MMOL/ML IV SOLN COMPARISON:  03/31/2020 CT abdomen/pelvis. FINDINGS: Lower chest: No acute abnormality at the lung bases. Hepatobiliary: Normal liver size and configuration. No hepatic steatosis. No liver mass. Normal gallbladder with no cholelithiasis. No significant intrahepatic or extrahepatic biliary ductal dilatation. Common bile duct diameter 3 mm. No choledocholithiasis. No biliary strictures, masses or beading. Generalized mild periportal edema. Pancreas: Diffuse pancreatic parenchymal edema and thickening, most prominently involving the pancreatic head, with relative sparing of the pancreatic tail. There are patchy regions of pre-contrast T1 hyperintensity in the pancreatic head (series 18/image 74) compatible with mild hemorrhage. There are patchy regions of decreased parenchymal perfusion throughout the pancreatic head. Findings are compatible with acute pancreatitis with necrotizing and mildly hemorrhagic change in the pancreatic head. No significant pancreatic duct dilation (2 mm diameter). No pancreas divisum. There are clustered complex cystic lesions in the inferior pancreatic head, largest 2.6 x 1.5 cm (series 30/image 71) with mildly thickened enhancing wall and thickening eccentric internal septation, probably evolving pseudocysts. Spleen: Normal size. No mass. Adrenals/Urinary Tract: Normal adrenals. No hydronephrosis. Subcentimeter simple bilateral renal cysts. No suspicious renal masses. Stomach/Bowel:  Normal non-distended stomach. Moderate gaseous distention of the visualized large bowel. No small bowel dilatation. No bowel wall thickening. Vascular/Lymphatic: Atherosclerotic nonaneurysmal abdominal aorta. Patent portal, splenic, hepatic and renal veins. No pathologically enlarged lymph nodes in the abdomen. Other: No abdominal ascites or focal fluid collection. Musculoskeletal: No aggressive appearing focal osseous lesions. IMPRESSION: 1. Acute pancreatitis, which is necrotizing and mildly hemorrhagic in the pancreatic head. No significant biliary or pancreatic duct dilation. CBD diameter 3 mm. 2. Clustered complex cystic lesions in the inferior pancreatic head, largest 2.6 x 1.5 cm with mildly thickened enhancing wall and eccentric internal septation, probably evolving pseudocysts. Follow-up MRI abdomen without and with IV contrast recommended in 3 months. 3. No cholelithiasis.  No choledocholithiasis. 4. Generalized mild periportal edema, nonspecific. Electronically Signed   By: Ilona Sorrel M.D.   On: 03/31/2020 17:14    Assessment / Plan:   Assessment: 1.  Pancreatitis and cyst: Improved since beginning of hospitalization, now tolerating a low-fat diet  Plan: 1.  Patient will need repeat MRI of the pancreas in 3 months 2.  We will go ahead and set patient up for a follow-up visit in 3 to 4 weeks so we can ensure that she is feeling better and set up imaging is needed 3.  Okay with patient discharge home today 4.  Please await any final recommendations from Dr. Tarri Glenn later today.  Thank you for your kind consultation, we will sign off.    LOS: 2 days   Levin Erp  04/02/2020, 1:17 PM

## 2020-04-03 LAB — CA 125: Cancer Antigen (CA) 125: 19 U/mL (ref 0.0–38.1)

## 2020-04-03 NOTE — Telephone Encounter (Signed)
Patient has been scheduled by staff to see Anderson Malta on Tuesday, 04/17/20 at 10:30 AM.

## 2020-04-05 ENCOUNTER — Ambulatory Visit: Payer: Medicaid Other | Admitting: Physician Assistant

## 2020-04-10 ENCOUNTER — Other Ambulatory Visit (HOSPITAL_COMMUNITY)
Admission: RE | Admit: 2020-04-10 | Discharge: 2020-04-10 | Disposition: A | Discharge status: Home / Self Care | Payer: Medicaid Other | Source: Ambulatory Visit | Attending: Obstetrics & Gynecology | Admitting: Obstetrics & Gynecology

## 2020-04-10 ENCOUNTER — Other Ambulatory Visit (HOSPITAL_BASED_OUTPATIENT_CLINIC_OR_DEPARTMENT_OTHER)
Admission: RE | Admit: 2020-04-10 | Discharge: 2020-04-10 | Disposition: A | Discharge status: Home / Self Care | Payer: Medicaid Other | Source: Ambulatory Visit | Attending: Obstetrics & Gynecology | Admitting: Obstetrics & Gynecology

## 2020-04-10 ENCOUNTER — Other Ambulatory Visit: Payer: Self-pay

## 2020-04-10 ENCOUNTER — Ambulatory Visit (INDEPENDENT_AMBULATORY_CARE_PROVIDER_SITE_OTHER): Payer: Medicaid Other | Admitting: Obstetrics & Gynecology

## 2020-04-10 ENCOUNTER — Other Ambulatory Visit (HOSPITAL_BASED_OUTPATIENT_CLINIC_OR_DEPARTMENT_OTHER): Payer: Self-pay

## 2020-04-10 ENCOUNTER — Encounter (HOSPITAL_BASED_OUTPATIENT_CLINIC_OR_DEPARTMENT_OTHER): Payer: Self-pay | Admitting: Obstetrics & Gynecology

## 2020-04-10 VITALS — BP 141/99 | HR 69 | Ht 71.0 in | Wt 148.2 lb

## 2020-04-10 DIAGNOSIS — K8501 Idiopathic acute pancreatitis with uninfected necrosis: Secondary | ICD-10-CM

## 2020-04-10 DIAGNOSIS — R87610 Atypical squamous cells of undetermined significance on cytologic smear of cervix (ASC-US): Secondary | ICD-10-CM | POA: Diagnosis not present

## 2020-04-10 DIAGNOSIS — Z124 Encounter for screening for malignant neoplasm of cervix: Secondary | ICD-10-CM

## 2020-04-10 DIAGNOSIS — N838 Other noninflammatory disorders of ovary, fallopian tube and broad ligament: Secondary | ICD-10-CM | POA: Diagnosis not present

## 2020-04-10 DIAGNOSIS — R3129 Other microscopic hematuria: Secondary | ICD-10-CM | POA: Diagnosis not present

## 2020-04-10 DIAGNOSIS — R87810 Cervical high risk human papillomavirus (HPV) DNA test positive: Secondary | ICD-10-CM

## 2020-04-10 DIAGNOSIS — I10 Essential (primary) hypertension: Secondary | ICD-10-CM

## 2020-04-10 DIAGNOSIS — F17200 Nicotine dependence, unspecified, uncomplicated: Secondary | ICD-10-CM

## 2020-04-10 LAB — URINALYSIS, MICROSCOPIC (REFLEX)

## 2020-04-10 NOTE — Progress Notes (Signed)
50 y.o. [id number] Single Black or [place] American female here for new patient appt.  Pt was hospitalized in early March for pancreatitis.  Pt reports she had severe upper abdominal pain and has lost a lot of weight.  Went to the ER where the acute pancreatitis was diagnosed.  CT obtained.  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  Has appt with [facility] for follow up on 04/18/2019.  Pt is aware and does plan to keep this appt.  Pain is much improved.  She is being careful about what she eats.  Does not drink alcohol.  Does smoke but has cut back significantly since being in the hospital.  Also noted at that time was a right ovarian enlargement, about 7cm, consistent with dermoid.  Pelvic MRI was obtained on 3/13 showing the lesions on the ovary were more likely endometriomas.  These measured 5.5cm, 4.0cm, and 3.9cm.  Pt reports she has experienced pelvic pain for years.  She has been on Depo Provera for years to help control the pain.  Was amenorrheic  for a few years but in the past year, she does cycle with the Depo Provera.  When this occurs, she has significant pain.  Needs to take midol or advil to control it so she can keep going to work.  Would like for this to all end.  CT, MRI, ultrasound and lab work from hospitalization reviewed.  Also, MRI and ultrasound from [facility] reviewed in [place].  Prior endometrioma noted on CT and ultrasound.  Pt reports my review of reports today is new information to her.  Review of hospital lab work also showed microscopic hematuria.  Pt does have hx of renal stones per chart.   Patient's last menstrual period was 03/12/2020.          The current method of family planning is Depo-Provera injections.    Smoker:  yes  Health Maintenance: Pap:  2015 (pt feels like was more recent but this is the most recent on I can find in Epic or Care Everywhere MMG:  05/2019 Colonoscopy:  Not done yet BMD:   n/a   reports that she has been  smoking cigarettes. She has been smoking about 0.25 packs per day. She has never used smokeless tobacco. She reports current drug use. Drug: Marijuana. She reports that she does not drink alcohol.  Past Medical History:  Diagnosis Date  . Hypertension     Past Surgical History:  Procedure Laterality Date  . LEEP    . NECK SURGERY      Current Outpatient Medications  Medication Sig Dispense Refill  . acetaminophen (TYLENOL) 325 MG tablet Take 2 tablets (650 mg total) by mouth every 6 (six) hours as needed for mild pain (or Fever >/= 101). 20 tablet 0  . ascorbic acid (VITAMIN C) 100 MG tablet Take by mouth.    . cetirizine (ZYRTEC) 10 MG tablet Take 10 mg by mouth daily as needed.    Marland Kitchen HYDROcodone-acetaminophen (NORCO/VICODIN) 5-325 MG tablet Take 1 tablet by mouth every 6 (six) hours as needed. 10 tablet 0  . medroxyPROGESTERone (DEPO-PROVERA) 400 MG/ML SUSP injection Inject 400 mg into the muscle once.    . ondansetron (ZOFRAN) 4 MG tablet Take 1 tablet (4 mg total) by mouth every 6 (six) hours as needed for nausea. 20 tablet 0  . vitamin B-12 (CYANOCOBALAMIN) 250 MCG tablet Take 250 mcg by mouth daily.     No current facility-administered  medications for this visit.    Family History  Problem Relation Age of Onset  . Lupus Mother   . Pancreatic cancer Father     Review of Systems  All other systems reviewed and are negative.   Exam:   BP (!) 141/99   Pulse 69   Ht 5\' 11"  (1.803 m)   Wt 148 lb 3.2 oz (67.2 kg)   LMP 03/12/2020   BMI 20.67 kg/m   Height: 5\' 11"  (180.3 cm)  General appearance: alert, cooperative and appears stated age Head: Normocephalic, without obvious abnormality, atraumatic Heart: regular rate and rhythm Abdomen: soft, non-tender; bowel sounds normal; no masses,  no organomegaly Extremities: extremities normal, atraumatic, no cyanosis or edema Skin: Skin color, texture, turgor normal. No rashes or lesions Lymph nodes: Cervical, supraclavicular,  and axillary nodes normal. No abnormal inguinal nodes palpated Neurologic: Grossly normal   Pelvic: External genitalia:  no lesions              Urethra:  normal appearing urethra with no masses, tenderness or lesions              Bartholins and Skenes: normal                 Vagina: normal appearing vagina with normal color and discharge, no lesions              Cervix: no lesions              Pap taken: Yes.   Bimanual Exam:  Uterus:  normal size, contour, position, consistency, mobility, non-tender              Adnexa: fullness in right adnexal region, no discrete mass noted, normal left adnexa, non tender               Rectovaginal: Confirms               Anus:  normal sphincter tone, no lesions  Chaperone, Shela Nevin, RN, was present for exam.  Assessment/Plan: 1. Ovarian mass (appears to be endometrioma that has enlarged since prior imaging in 2021, pt was not aware of this diagnosis however) - due to enlarging size, this will likely need to be removed.  Pt is also interested in hysterectomy - CA 125; Future  2. Idiopathic acute pancreatitis with uninfected necrosis - has GI follow up on 3/29.  Will need to review with GI when surgery is safe to consider.  Pt clearly understands this and importance of follow up.  3. Hematuria, microscopic - Urine Microscopic; Future  4. ASCUS with positive high risk HPV cervical pap smear - Cytology - PAP( [place])  5. Essential hypertension - Will need improved management prior to surgery.  Pt has appt with Dr. Criss Rosales this week as well.  6. Tobacco dependence - pt smoking about 2 cigarettes a day since hospitalization.  Is going to try to continue to cut back.

## 2020-04-11 LAB — CA 125: Cancer Antigen (CA) 125: 28.6 U/mL (ref 0.0–38.1)

## 2020-04-16 ENCOUNTER — Ambulatory Visit (HOSPITAL_BASED_OUTPATIENT_CLINIC_OR_DEPARTMENT_OTHER): Payer: Medicaid Other | Admitting: Obstetrics & Gynecology

## 2020-04-16 ENCOUNTER — Other Ambulatory Visit: Payer: Self-pay

## 2020-04-17 ENCOUNTER — Encounter: Payer: Self-pay | Admitting: Physician Assistant

## 2020-04-17 ENCOUNTER — Other Ambulatory Visit: Payer: Self-pay

## 2020-04-17 ENCOUNTER — Encounter (HOSPITAL_BASED_OUTPATIENT_CLINIC_OR_DEPARTMENT_OTHER): Payer: Self-pay

## 2020-04-17 ENCOUNTER — Ambulatory Visit (INDEPENDENT_AMBULATORY_CARE_PROVIDER_SITE_OTHER): Payer: Medicaid Other | Admitting: Physician Assistant

## 2020-04-17 VITALS — BP 140/80 | HR 59 | Ht 71.0 in | Wt 151.6 lb

## 2020-04-17 DIAGNOSIS — K862 Cyst of pancreas: Secondary | ICD-10-CM

## 2020-04-17 DIAGNOSIS — K859 Acute pancreatitis without necrosis or infection, unspecified: Secondary | ICD-10-CM | POA: Diagnosis not present

## 2020-04-17 NOTE — Progress Notes (Signed)
Reviewed.  Ronette Hank L. Dalasia Predmore, MD, MPH  

## 2020-04-17 NOTE — Progress Notes (Signed)
Chief Complaint: Follow-up hospitalization for pancreatitis  HPI:    Mary Ramos is a 50 year old African-American female with a past medical history as listed below, who was assigned to Dr. Tarri Glenn during recent hospitalization and presents to clinic today for follow-up after this hospitalization for pancreatitis.       03/31/2020 patient was consulted by our service for upper abdominal pain for 2 weeks with nausea and vomiting.  She had CT of the abdomen pelvis showing abnormal decreased enhancement of the lengthening process of the pancreas, suspicious for pancreatitis with multiple small rounded fluid collections along the inferior margin of the uncinate process, measuring up to 1.2 cm in size, suspicious for multiple small loculated pseudocyst or abscess.  Patient had an MRCP to better delineate pancreatic anatomy.  MRCP showed acute pancreatitis, which is necrotizing mildly hemorrhagic in the pancreatic head with no significant biliary or pancreatic duct dilation, CBD diameter 3 mm.  Clustered complex cystic lesions in the inferior pancreatic head, largest 2.6 x 1.5 cm with mildly thickened enhancing wall and the eccentric internal septation probably evolving pseudocyst.  No cholelithiasis or choledocholithiasis.  Mild periportal edema.    04/01/2020 patient was doing well and asked to advance diet.  Is recommended she have a repeat MRI of the pancreas in 3 months.  Is also discussed that she may benefit from EUS at some point if symptoms are not getting better.    04/02/2020 our service signed off as the patient was clinically improving.  The etiology of her pancreatitis was still unclear.  IgG4 returned to normal.    Today, the patient tells me that she feels much much better.  She is still on somewhat of a decreased diet because she just does not feel like eating.  Will occasionally get nauseous but no more vomiting and no abdominal pain.  She is still using her Zofran before eating most of the time.   Tells me that she has altered her diet to a healthier one with no further fried foods etc. and has decreased her smoking, currently down to 2 cigarettes a day with plans to stop.  Overall she is very happy with the care she received and glad she is feeling better.    Denies fever, chills, weight loss or change in bowel habits.  Past Medical History:  Diagnosis Date  . Hypertension     Past Surgical History:  Procedure Laterality Date  . LEEP     years ago  . NECK SURGERY  2017   with right shoulder surgery    Current Outpatient Medications  Medication Sig Dispense Refill  . acetaminophen (TYLENOL) 325 MG tablet Take 2 tablets (650 mg total) by mouth every 6 (six) hours as needed for mild pain (or Fever >/= 101). 20 tablet 0  . ascorbic acid (VITAMIN C) 100 MG tablet Take by mouth.    . cetirizine (ZYRTEC) 10 MG tablet Take 10 mg by mouth daily as needed.    Marland Kitchen HYDROcodone-acetaminophen (NORCO/VICODIN) 5-325 MG tablet Take 1 tablet by mouth every 6 (six) hours as needed. 10 tablet 0  . medroxyPROGESTERone (DEPO-PROVERA) 400 MG/ML SUSP injection Inject 400 mg into the muscle once.    . ondansetron (ZOFRAN) 4 MG tablet Take 1 tablet (4 mg total) by mouth every 6 (six) hours as needed for nausea. 20 tablet 0  . vitamin B-12 (CYANOCOBALAMIN) 250 MCG tablet Take 250 mcg by mouth daily.     No current facility-administered medications for this visit.  Allergies as of 04/17/2020 - Review Complete 04/10/2020  Allergen Reaction Noted  . Carbomer Hives 12/04/2017    Family History  Problem Relation Age of Onset  . Lupus Mother   . Pancreatic cancer Father     Social History   Socioeconomic History  . Marital status: Single    Spouse name: Not on file  . Number of children: Not on file  . Years of education: Not on file  . Highest education level: Not on file  Occupational History  . Not on file  Tobacco Use  . Smoking status: Current Every Day Smoker    Packs/day: 0.25     Types: Cigarettes  . Smokeless tobacco: Never Used  . Tobacco comment: 2 per day  Vaping Use  . Vaping Use: Never used  Substance and Sexual Activity  . Alcohol use: No    Alcohol/week: 1.0 standard drink    Types: 1 Standard drinks or equivalent per week  . Drug use: Yes    Types: Marijuana  . Sexual activity: Not Currently    Birth control/protection: Injection  Other Topics Concern  . Not on file  Social History Narrative  . Not on file   Social Determinants of Health   Financial Resource Strain: Not on file  Food Insecurity: Not on file  Transportation Needs: Not on file  Physical Activity: Not on file  Stress: Not on file  Social Connections: Not on file  Intimate Partner Violence: Not on file    Review of Systems:    Constitutional: No weight loss, fever or chills Cardiovascular: No chest pain  Respiratory: No SOB  Gastrointestinal: See HPI and otherwise negative   Physical Exam:  Vital signs: BP 140/80   Pulse (!) 59   Ht 5\' 11"  (1.803 m)   Wt 151 lb 9.6 oz (68.8 kg)   SpO2 99%   BMI 21.14 kg/m   Constitutional:   Pleasant AA female appears to be in NAD, Well developed, Well nourished, alert and cooperative Respiratory: Respirations even and unlabored. Lungs clear to auscultation bilaterally.   No wheezes, crackles, or rhonchi.  Cardiovascular: Normal S1, S2. No MRG. Regular rate and rhythm. No peripheral edema, cyanosis or pallor.  Gastrointestinal:  Soft, nondistended, nontender. No rebound or guarding. Normal bowel sounds. No appreciable masses or hepatomegaly. Rectal:  Not performed.  Psychiatric:  Demonstrates good judgement and reason without abnormal affect or behaviors.  RELEVANT LABS AND IMAGING: CBC    Component Value Date/Time   WBC 7.8 04/02/2020 0309   RBC 3.50 (L) 04/02/2020 0309   HGB 10.1 (L) 04/02/2020 0309   HCT 30.7 (L) 04/02/2020 0309   PLT 277 04/02/2020 0309   MCV 87.7 04/02/2020 0309   MCH 28.9 04/02/2020 0309   MCHC 32.9  04/02/2020 0309   RDW 14.3 04/02/2020 0309   LYMPHSABS 2.1 04/02/2020 0309   MONOABS 0.7 04/02/2020 0309   EOSABS 0.2 04/02/2020 0309   BASOSABS 0.0 04/02/2020 0309    CMP     Component Value Date/Time   NA 137 04/02/2020 0309   K 3.8 04/02/2020 0309   CL 108 04/02/2020 0309   CO2 19 (L) 04/02/2020 0309   GLUCOSE 101 (H) 04/02/2020 0309   BUN 11 04/02/2020 0309   CREATININE 0.84 04/02/2020 0309   CALCIUM 8.8 (L) 04/02/2020 0309   PROT 6.7 04/02/2020 0309   ALBUMIN 2.9 (L) 04/02/2020 0309   AST 15 04/02/2020 0309   ALT 12 04/02/2020 0309   ALKPHOS 51 04/02/2020  0309   BILITOT 0.6 04/02/2020 0309   GFRNONAA >60 04/02/2020 0309   GFRAA >60 09/25/2016 2024    Assessment: 1.  Pancreatitis with pancreatic cyst: Continues with a small amount of nausea after eating, but no further vomiting or abdominal pain  Plan: 1.  Continue Zofran 4 mg as needed for nausea. 2.  Encouraged the patient to stay on her new healthy diet and quit smoking. 3.  Discussed the patient that we will order a repeat MRI 3 months after her last one with and without contrast, pancreatic protocol.  4.  Patient to follow in clinic with [place] as needed.  Patient will also be placed in recall for a screening colonoscopy in 6 months or so, we will give her some time to overcome this pancreatitis.  Mary Newer, PA-C [facility] 04/17/2020, 10:32 AM  Cc: Lucianne Lei, MD

## 2020-04-17 NOTE — Patient Instructions (Signed)
If you are age 48 or older, your body mass index should be between 23-30. Your Body mass index is 21.14 kg/m. If this is out of the aforementioned range listed, please consider follow up with your Primary Care Provider.  If you are age 19 or younger, your body mass index should be between 19-25. Your Body mass index is 21.14 kg/m. If this is out of the aformentioned range listed, please consider follow up with your Primary Care Provider.   Thank you for choosing me and [facility].  Ellouise Newer, PA-C

## 2020-04-20 ENCOUNTER — Encounter (HOSPITAL_COMMUNITY): Payer: Self-pay

## 2020-04-20 ENCOUNTER — Other Ambulatory Visit: Payer: Self-pay

## 2020-04-20 ENCOUNTER — Emergency Department (HOSPITAL_COMMUNITY)
Admission: EM | Admit: 2020-04-20 | Discharge: 2020-04-20 | Disposition: A | Discharge status: Home / Self Care | Payer: Medicaid Other | Attending: Emergency Medicine | Admitting: Emergency Medicine

## 2020-04-20 ENCOUNTER — Emergency Department (HOSPITAL_COMMUNITY): Payer: Medicaid Other

## 2020-04-20 DIAGNOSIS — R102 Pelvic and perineal pain: Secondary | ICD-10-CM

## 2020-04-20 DIAGNOSIS — N83201 Unspecified ovarian cyst, right side: Secondary | ICD-10-CM | POA: Insufficient documentation

## 2020-04-20 DIAGNOSIS — I10 Essential (primary) hypertension: Secondary | ICD-10-CM | POA: Insufficient documentation

## 2020-04-20 DIAGNOSIS — R1031 Right lower quadrant pain: Secondary | ICD-10-CM | POA: Diagnosis present

## 2020-04-20 DIAGNOSIS — F17210 Nicotine dependence, cigarettes, uncomplicated: Secondary | ICD-10-CM | POA: Insufficient documentation

## 2020-04-20 LAB — COMPREHENSIVE METABOLIC PANEL
ALT: 10 U/L (ref 0–44)
AST: 15 U/L (ref 15–41)
Albumin: 3.9 g/dL (ref 3.5–5.0)
Alkaline Phosphatase: 63 U/L (ref 38–126)
Anion gap: 10 (ref 5–15)
BUN: 18 mg/dL (ref 6–20)
CO2: 21 mmol/L — ABNORMAL LOW (ref 22–32)
Calcium: 9.5 mg/dL (ref 8.9–10.3)
Chloride: 106 mmol/L (ref 98–111)
Creatinine, Ser: 0.77 mg/dL (ref 0.44–1.00)
GFR, Estimated: >60 mL/min (ref >60)
Glucose, Bld: 90 mg/dL (ref 70–99)
Potassium: 3.9 mmol/L (ref 3.5–5.1)
Sodium: 137 mmol/L (ref 135–145)
Total Bilirubin: 0.5 mg/dL (ref 0.3–1.2)
Total Protein: 8.5 g/dL — ABNORMAL HIGH (ref 6.5–8.1)

## 2020-04-20 LAB — CBC WITH DIFFERENTIAL/PLATELET
Abs Immature Granulocytes: 0.02 10*3/uL (ref 0.00–0.07)
Basophils Absolute: 0 10*3/uL (ref 0.0–0.1)
Basophils Relative: 0 %
Eosinophils Absolute: 0.2 10*3/uL (ref 0.0–0.5)
Eosinophils Relative: 4 %
HCT: 34 % — ABNORMAL LOW (ref 36.0–46.0)
Hemoglobin: 11 g/dL — ABNORMAL LOW (ref 12.0–15.0)
Immature Granulocytes: 0 %
Lymphocytes Relative: 41 %
Lymphs Abs: 2.6 10*3/uL (ref 0.7–4.0)
MCH: 27.9 pg (ref 26.0–34.0)
MCHC: 32.4 g/dL (ref 30.0–36.0)
MCV: 86.3 fL (ref 80.0–100.0)
Monocytes Absolute: 0.6 10*3/uL (ref 0.1–1.0)
Monocytes Relative: 9 %
Neutro Abs: 2.9 10*3/uL (ref 1.7–7.7)
Neutrophils Relative %: 46 %
Platelets: 333 10*3/uL (ref 150–400)
RBC: 3.94 MIL/uL (ref 3.87–5.11)
RDW: 15.3 % (ref 11.5–15.5)
WBC: 6.3 10*3/uL (ref 4.0–10.5)
nRBC: 0 % (ref 0.0–0.2)

## 2020-04-20 LAB — URINALYSIS, ROUTINE W REFLEX MICROSCOPIC
Bacteria, UA: NONE SEEN
Bilirubin Urine: NEGATIVE
Glucose, UA: NEGATIVE mg/dL
Ketones, ur: NEGATIVE mg/dL
Leukocytes,Ua: NEGATIVE
Nitrite: NEGATIVE
Protein, ur: NEGATIVE mg/dL
Specific Gravity, Urine: 1.025 (ref 1.005–1.030)
pH: 5 (ref 5.0–8.0)

## 2020-04-20 LAB — PREGNANCY, URINE: Preg Test, Ur: NEGATIVE

## 2020-04-20 LAB — LIPASE, BLOOD: Lipase: 37 U/L (ref 11–51)

## 2020-04-20 IMAGING — US US ART/VEN ABD/PELV/SCROTUM DOPPLER LTD
1 series · 13 of 25 positions shown · non-contrast
Comparison: Pelvic MRI [DATE], ultrasound [DATE], CT
[DATE]

CLINICAL DATA: Pelvic pain

EXAM:
TRANSABDOMINAL AND TRANSVAGINAL ULTRASOUND OF PELVIS
DOPPLER ULTRASOUND OF OVARIES
TECHNIQUE: Both transabdominal and transvaginal ultrasound examinations of the
pelvis were performed. Transabdominal technique was performed for
global imaging of the pelvis including uterus, ovaries, adnexal
regions, and pelvic cul-de-sac.
It was necessary to proceed with endovaginal exam following the
transabdominal exam to visualize the uterus endometrium ovaries.
Color and duplex Doppler ultrasound was utilized to evaluate blood
flow to the ovaries.

[Series 1: us art/ven abd/pelv/scrotum doppler ltd · 13 of 174 slices shown]
[im 1/174]
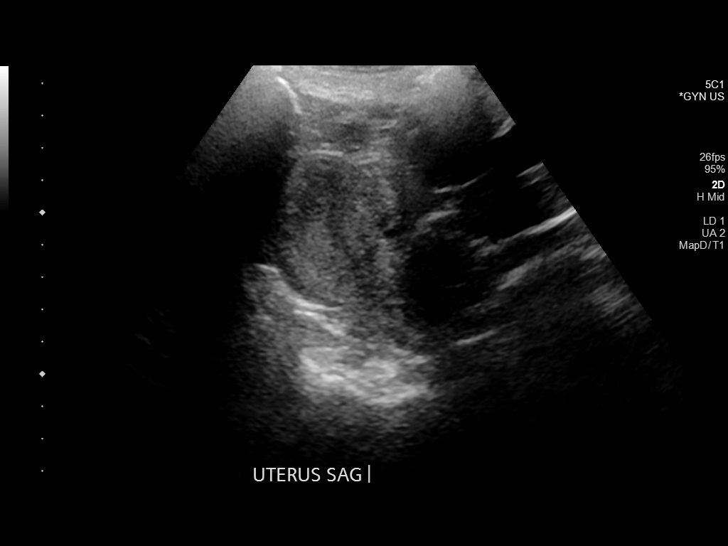
[im 15/174]
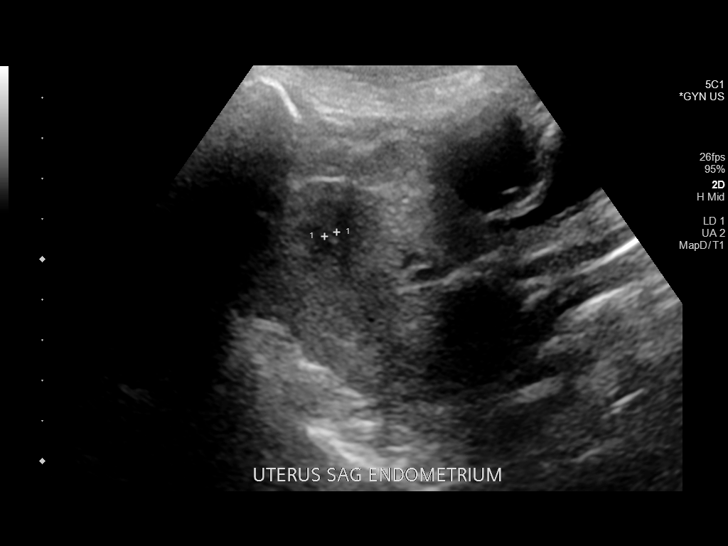
[im 29/174]
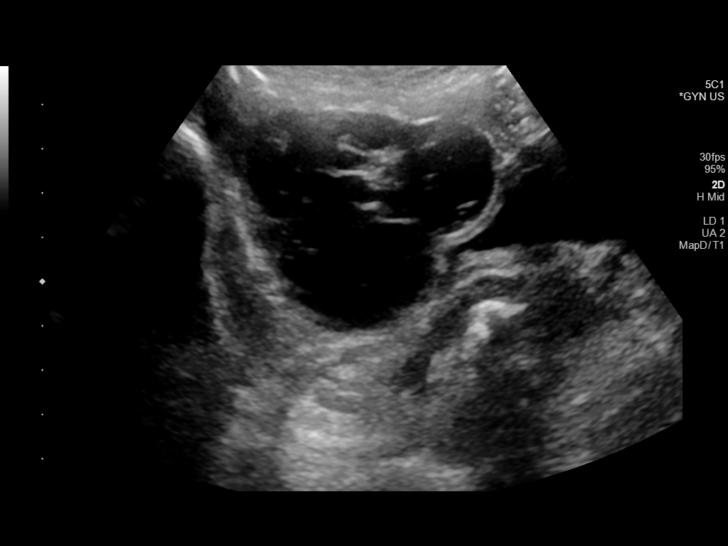
[im 44/174]
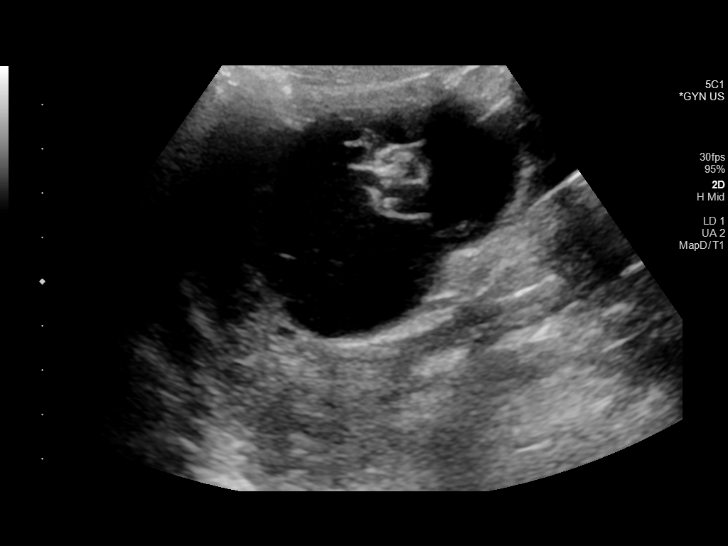
[im 58/174]
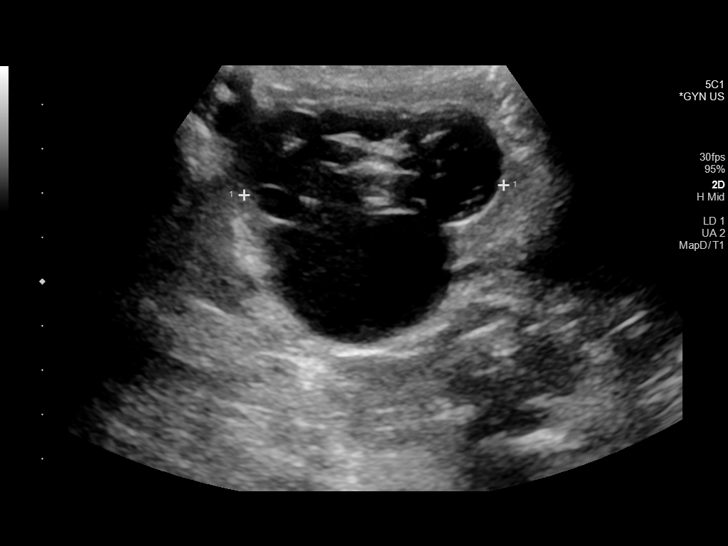
[im 73/174]
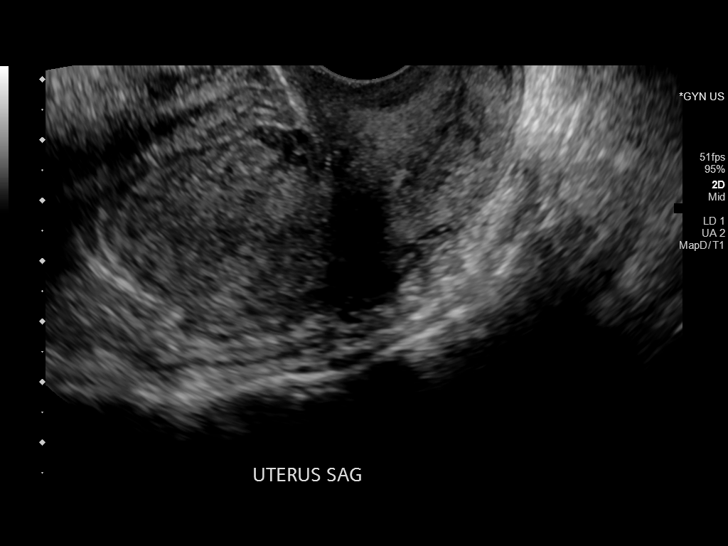
[im 87/174]
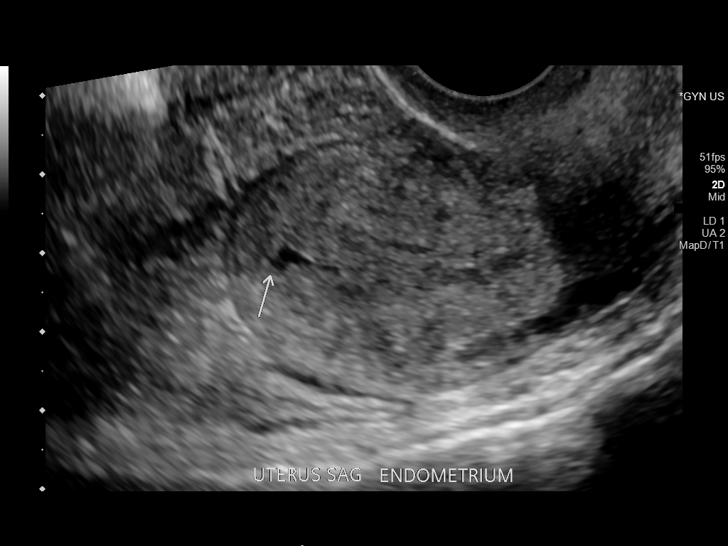
[im 101/174]
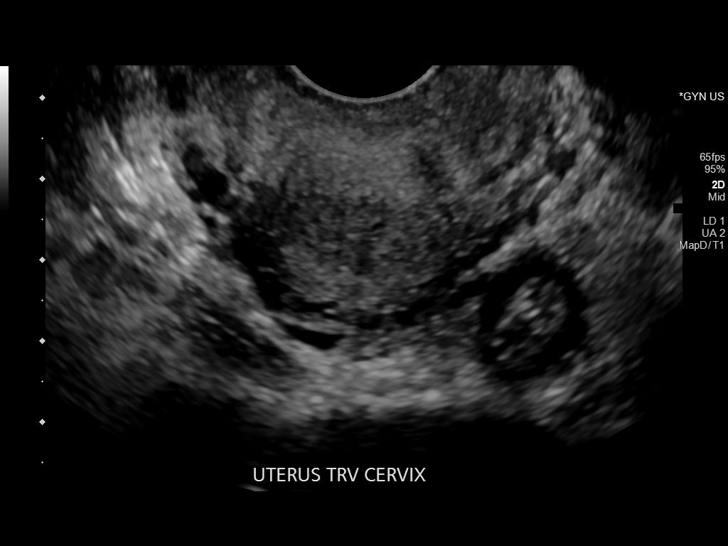
[im 116/174]
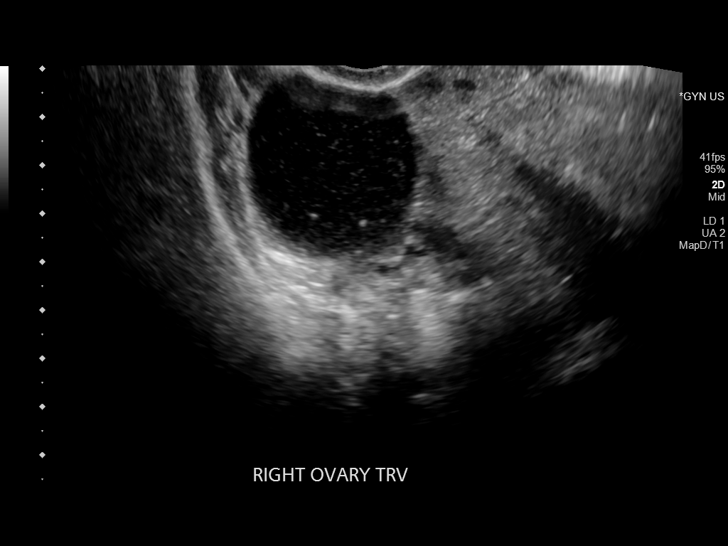
[im 130/174]
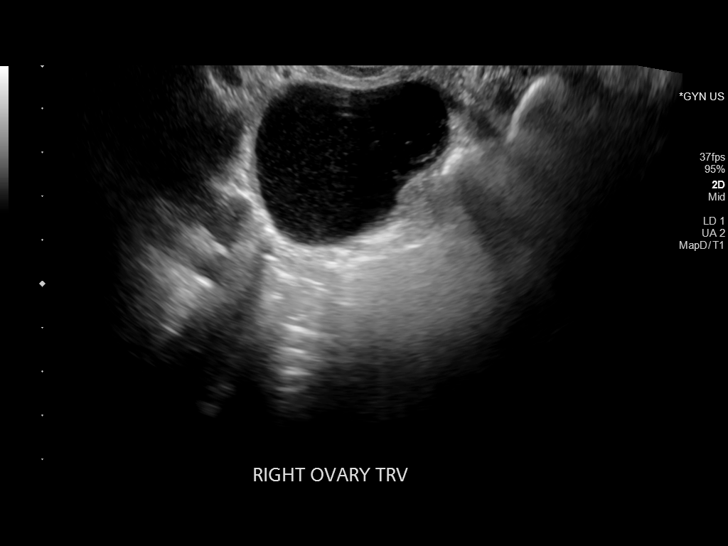
[im 145/174]
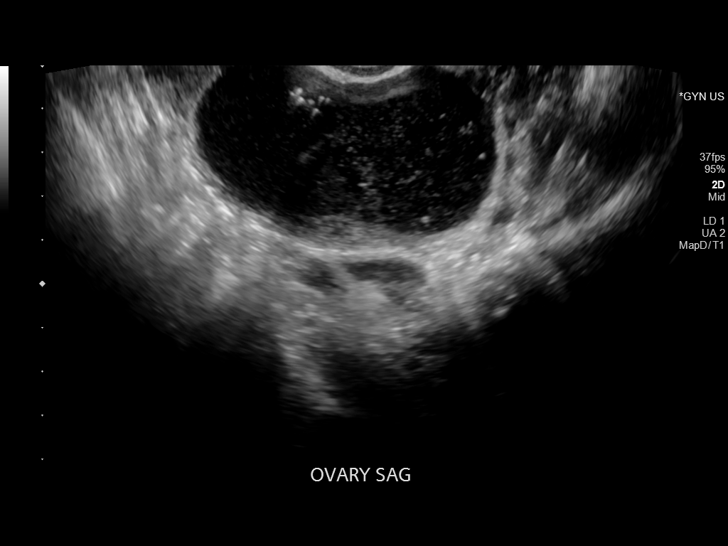
[im 159/174]
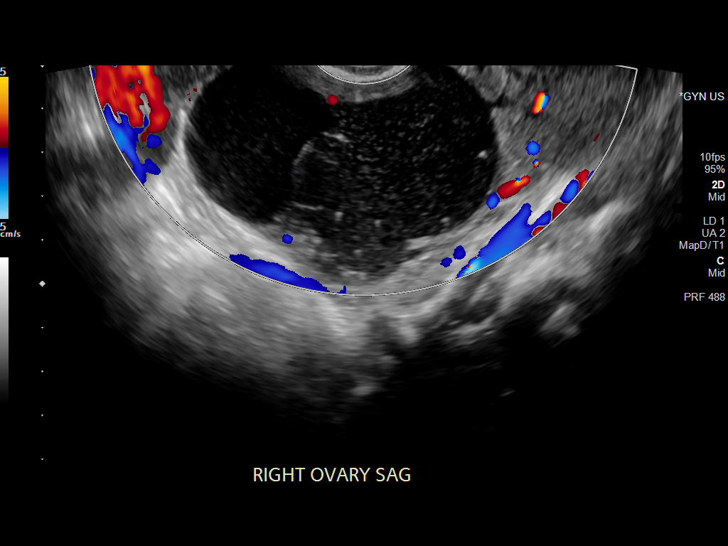
[im 174/174]
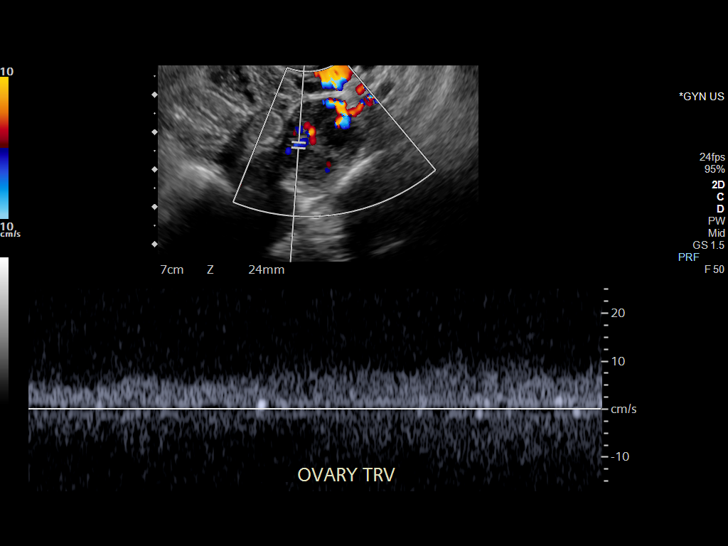

[13 of 25 positions shown; findings below may reference images not displayed]

FINDINGS: Uterus

Measurements: 7.4 x 3.7 x 4.7 cm = volume: 67.1 mL. No fibroids or
other mass visualized.

Endometrium

Thickness: 2 mm.  Tiny fluid within the fundal endometrial canal

Right ovary

Measurements: 8 x 5.7 x 6.3 cm = volume: 148.2 mL. Redemonstrated
complex right adnexal cystic mass measuring 7.3 by 4.5 x 4.6 cm,
prior ultrasound measurements of 6 x 4.3 x 7.2 cm. Contains internal
echodense foci and septations.

Left ovary

Measurements: 2.9 x 1.9 x 2.3 cm = volume: 6.5 mL. Normal
appearance/no adnexal mass.

Pulsed Doppler evaluation of both ovaries demonstrates normal
low-resistance arterial and venous waveforms.

Other findings

No abnormal free fluid.
IMPRESSION: 1. Negative for ovarian torsion.
2. Grossly stable complex cystic lesion in the right adnexa, thought
to represent endometriomas on prior MRI, refer to MRI report
[DATE] for additional recommendations.

## 2020-04-20 IMAGING — US US PELVIS COMPLETE WITH TRANSVAGINAL
1 series · 13 of 25 positions shown · non-contrast
Comparison: Pelvic MRI [DATE], ultrasound [DATE], CT
[DATE]

CLINICAL DATA: Pelvic pain

EXAM:
TRANSABDOMINAL AND TRANSVAGINAL ULTRASOUND OF PELVIS
DOPPLER ULTRASOUND OF OVARIES
TECHNIQUE: Both transabdominal and transvaginal ultrasound examinations of the
pelvis were performed. Transabdominal technique was performed for
global imaging of the pelvis including uterus, ovaries, adnexal
regions, and pelvic cul-de-sac.
It was necessary to proceed with endovaginal exam following the
transabdominal exam to visualize the uterus endometrium ovaries.
Color and duplex Doppler ultrasound was utilized to evaluate blood
flow to the ovaries.

[Series 1: us pelvis complete with transvaginal · 13 of 174 slices shown]
[im 1/174]
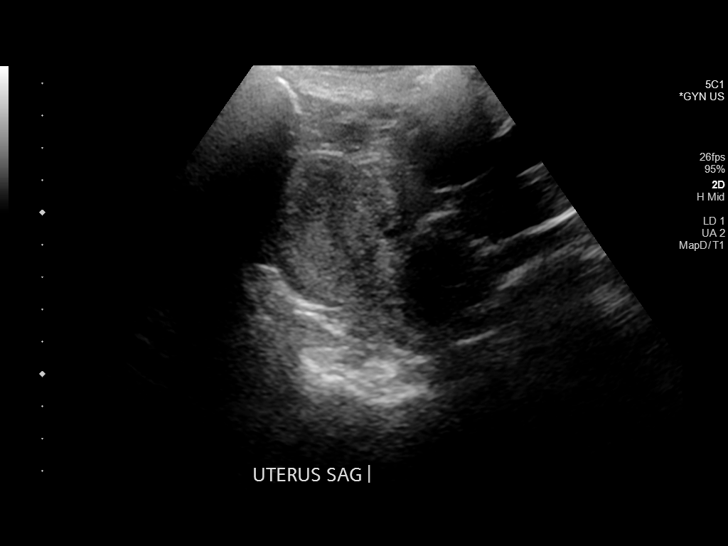
[im 15/174]
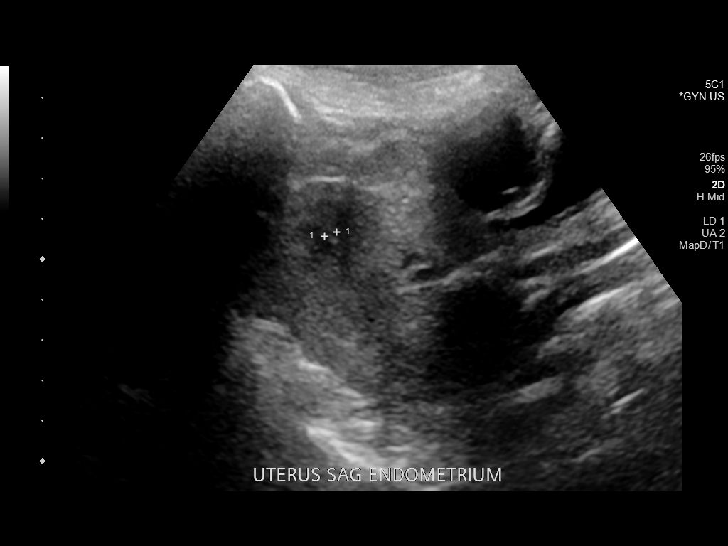
[im 29/174]
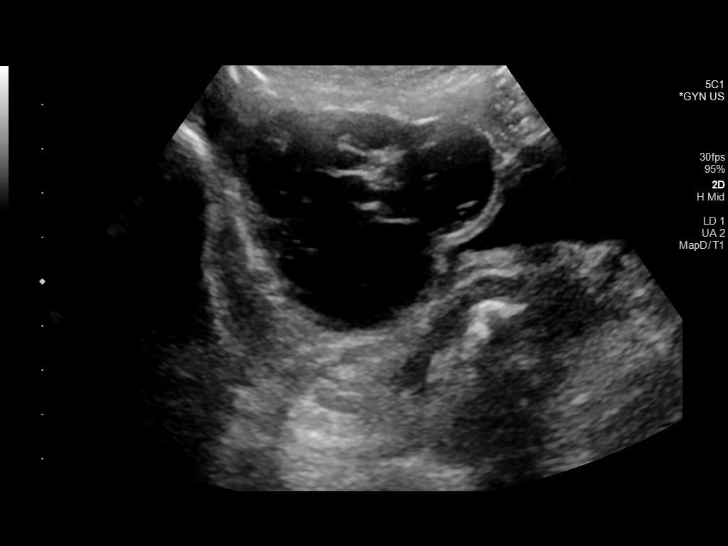
[im 44/174]
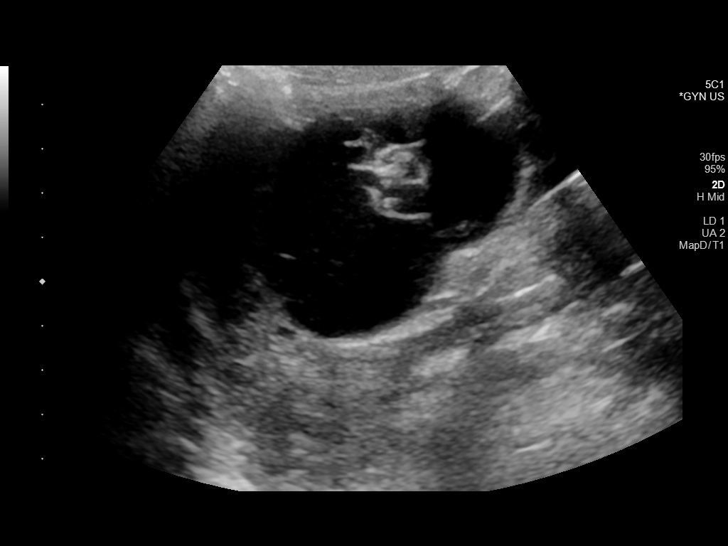
[im 58/174]
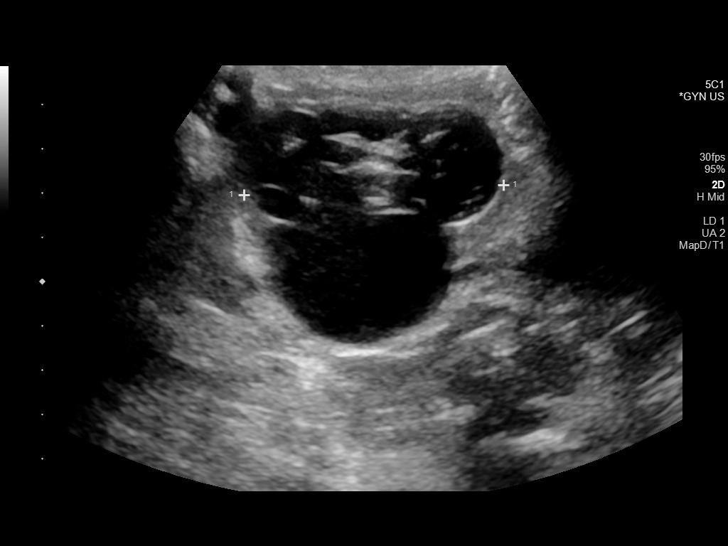
[im 73/174]
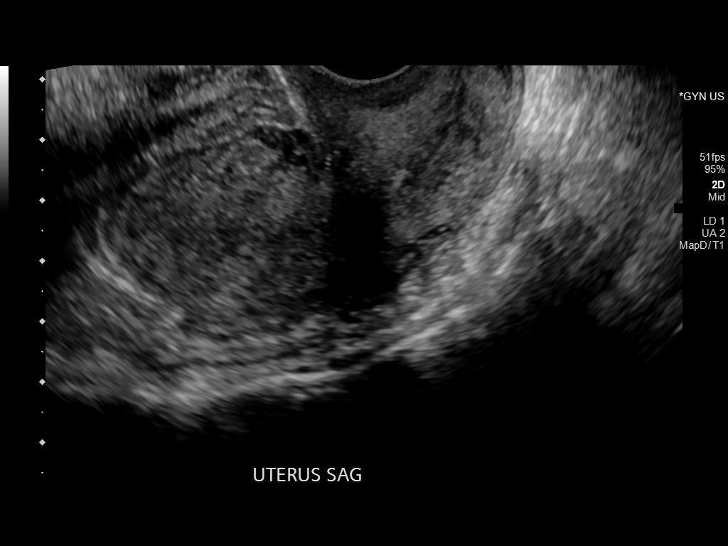
[im 87/174]
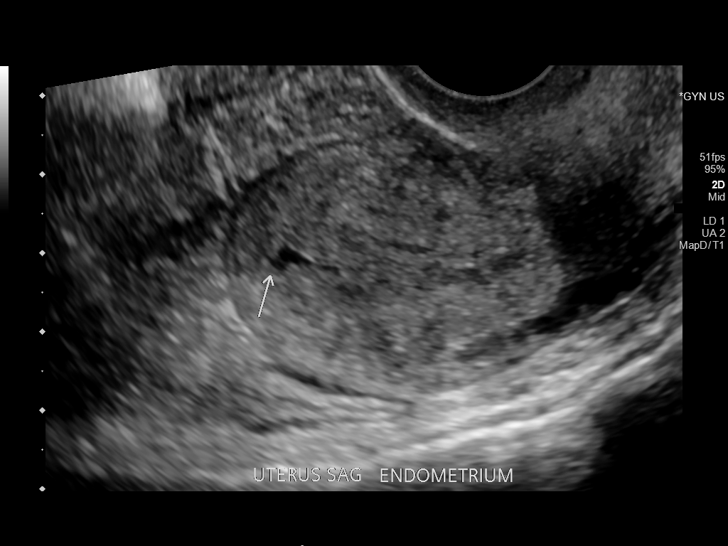
[im 101/174]
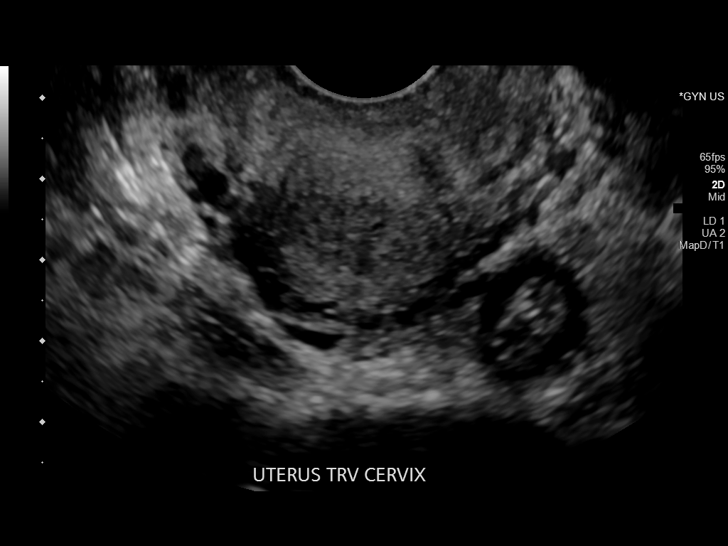
[im 116/174]
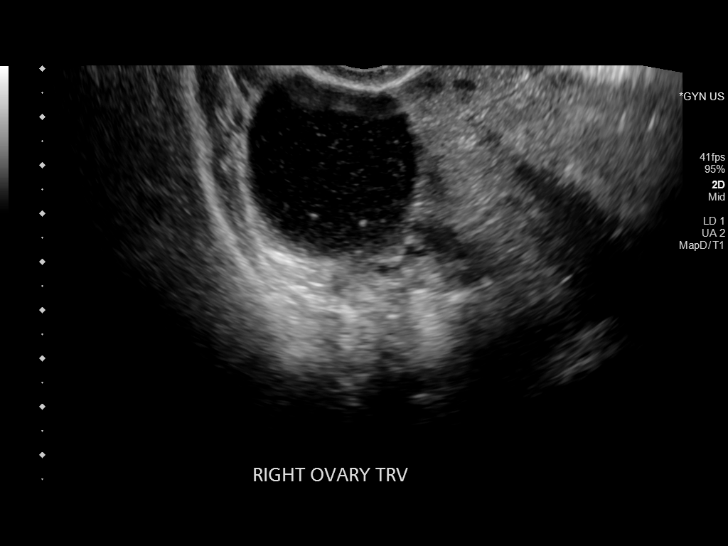
[im 130/174]
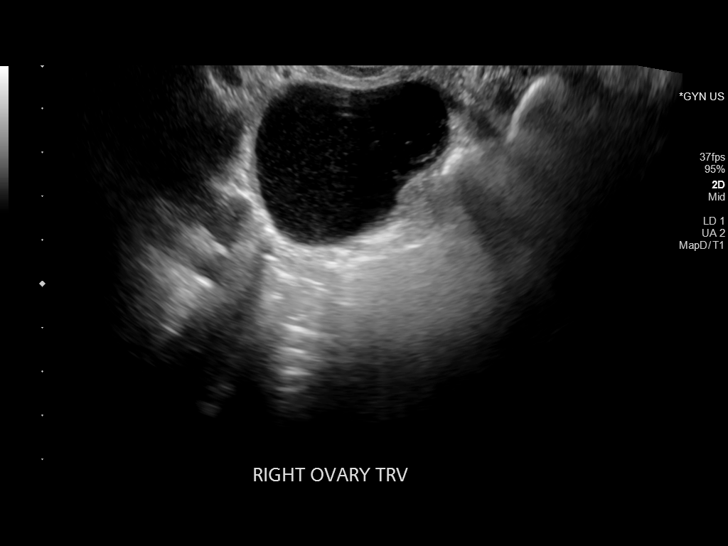
[im 145/174]
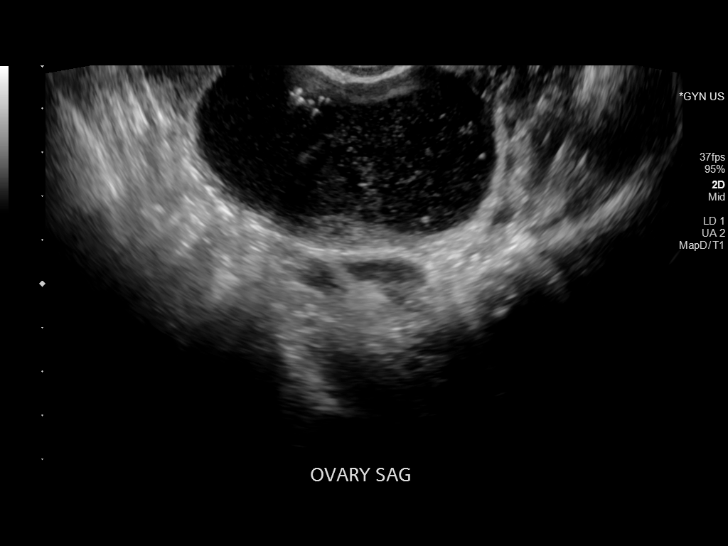
[im 159/174]
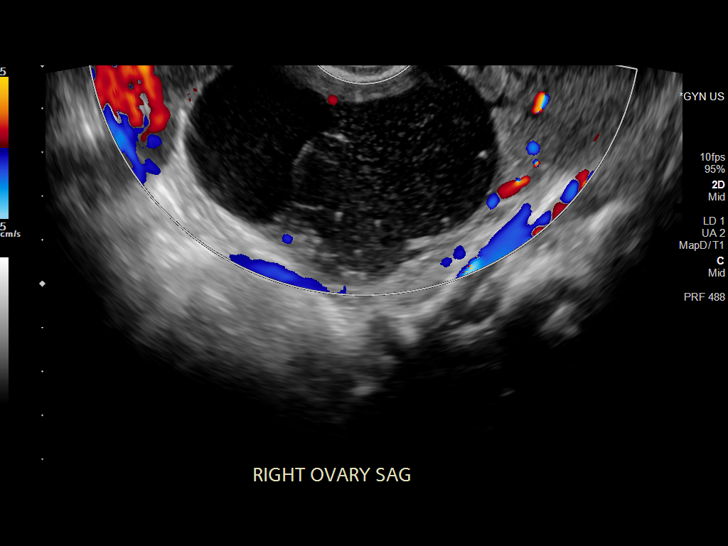
[im 174/174]
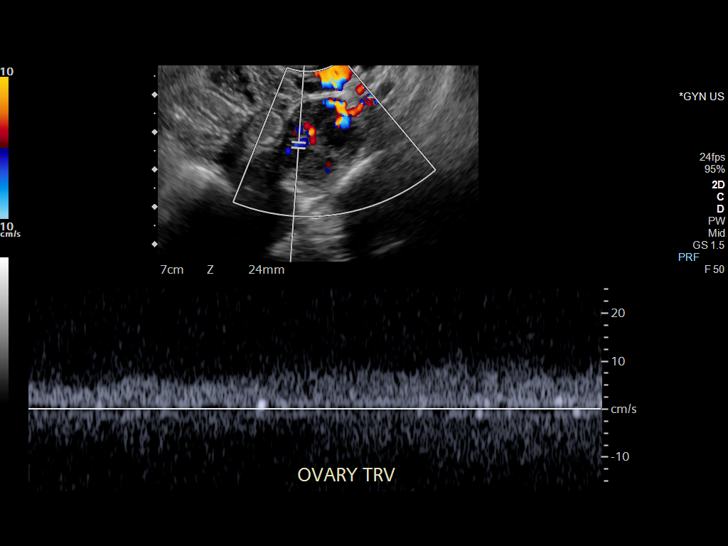

[13 of 25 positions shown; findings below may reference images not displayed]

FINDINGS: Uterus

Measurements: 7.4 x 3.7 x 4.7 cm = volume: 67.1 mL. No fibroids or
other mass visualized.

Endometrium

Thickness: 2 mm.  Tiny fluid within the fundal endometrial canal

Right ovary

Measurements: 8 x 5.7 x 6.3 cm = volume: 148.2 mL. Redemonstrated
complex right adnexal cystic mass measuring 7.3 by 4.5 x 4.6 cm,
prior ultrasound measurements of 6 x 4.3 x 7.2 cm. Contains internal
echodense foci and septations.

Left ovary

Measurements: 2.9 x 1.9 x 2.3 cm = volume: 6.5 mL. Normal
appearance/no adnexal mass.

Pulsed Doppler evaluation of both ovaries demonstrates normal
low-resistance arterial and venous waveforms.

Other findings

No abnormal free fluid.
IMPRESSION: 1. Negative for ovarian torsion.
2. Grossly stable complex cystic lesion in the right adnexa, thought
to represent endometriomas on prior MRI, refer to MRI report
[DATE] for additional recommendations.

## 2020-04-20 MED ORDER — ONDANSETRON HCL 4 MG/2ML IJ SOLN
4.0000 mg | Freq: Once | INTRAMUSCULAR | Status: AC
  Administered 2020-04-20: 4 mg via INTRAVENOUS
  Filled 2020-04-20: qty 2

## 2020-04-20 MED ORDER — OXYCODONE-ACETAMINOPHEN 5-325 MG PO TABS
1.0000 | ORAL_TABLET | Freq: Once | ORAL | Status: AC
  Administered 2020-04-20: 1 via ORAL
  Filled 2020-04-20: qty 1

## 2020-04-20 MED ORDER — KETOROLAC TROMETHAMINE 30 MG/ML IJ SOLN
30.0000 mg | Freq: Once | INTRAMUSCULAR | Status: AC
  Administered 2020-04-20: 30 mg via INTRAVENOUS
  Filled 2020-04-20: qty 1

## 2020-04-20 NOTE — ED Provider Notes (Signed)
Patient seen in triage for MSE.   Chief Complaint: Right pelvic pain, uncontrolled  HPI:   Right ovarian endometrioma, following with OBGYN with surgery pending. Pain uncontrolled at home, patient unable to work.   ROS: right pelvic pain. NEGATIVE for vaginal bleeding /discharge, dysuria, fevers, chills  Physical Exam:   Gen: No distress  Neuro: Awake and Alert  Skin: Warm    Focused Exam: right pelvic TTP   Initiation of care has begun. The patient has been counseled on the process, plan, and necessity for staying for the completion/evaluation, and the remainder of the medical screening examination  MSE was initiated and I personally evaluated the patient and placed orders (if any) at  6:47 PM on April 20, 2020.  The patient appears stable so that the remainder of the MSE may be completed by another provider.   Aura Dials 04/20/20 1849    Lacretia Leigh, MD 04/23/20 [phone]

## 2020-04-20 NOTE — ED Provider Notes (Signed)
Patient seen in triage for MSE.   Chief Complaint: Right pelvic pain  HPI:   Right ovarian endometriomas, worsening pain over the last week. Attempting to schedule surgery for removal, but now 10/10 pain  ROS: right pelvic pain NEGATIVE for vaginal bleeding or discharge  Physical Exam:   Gen: No distress  Neuro: Awake and Alert  Skin: Warm    Focused Exam: Right pelvic TTP, abdominal exam otherwise benign   Initiation of care has begun. The patient has been counseled on the process, plan, and necessity for staying for the completion/evaluation, and the remainder of the medical screening examination  MSE was initiated and I personally evaluated the patient and placed orders (if any) at  6:39 PM on April 20, 2020.  The patient appears stable so that the remainder of the MSE may be completed by another provider.   Aura Dials 04/20/20 1841    Lacretia Leigh, MD 04/23/20 [phone]

## 2020-04-20 NOTE — ED Provider Notes (Signed)
[facility] DEPT Provider Note   CSN: [id number] Arrival date & time: 04/20/20  1807     History Chief Complaint  Patient presents with  . Abdominal Pain    Mary Ramos is a 50 y.o. female.  HPI Patient is a 50 year old female presented today with right lower quadrant abdominal pain.  She states that it is more pelvic pain.  She states it hurts more when she walks around and moves.  She states it seems to come and go.  She states that she has had some nausea but no vomiting.  She states that her pain has been ongoing for 3 days now.  She states that is continued to be intermittent.  Does not seem has gotten progressively worse.  She states it got very severe before she came to the ER which prompted her to visit but then felt better after she was given 1 Percocet in the waiting room.  She denies any nausea or vomiting since.  Denies any lightheadedness or dizziness.  No chest pain or shortness of breath.  She has a history of pancreatitis but states that she has no upper abdominal pain does not drink any alcohol.  Denies any recreational drug use.  Notably when she was diagnosed with pancreatitis she was told that she had a right adnexal cyst.  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 Patient states her bowel movements have been normal.  Denies any vaginal discharge.  Denies any vaginal itching.  She denies any concern for STDs.    Past Medical History:  Diagnosis Date  . Hypertension     Patient Active Problem List   Diagnosis Date Noted  . Pancreatitis 03/31/2020  . Abnormal finding on CT scan 03/31/2020  . Hematuria 03/31/2020  . Anemia 03/31/2020  . 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 Depo-Provera contraceptive status 06/07/2019  . Current moderate episode of major depressive disorder without prior episode ([place]) 11/23/2017  . Nephrolithiasis 11/23/2017  . Cervical spinal stenosis 02/11/2017  . Spondylosis of cervical region  without myelopathy or radiculopathy 11/04/2016  . Essential hypertension 07/01/2016  . Tobacco dependence 07/01/2016  . ASCUS with positive high risk HPV cervical pap smear 12/02/2013    Past Surgical History:  Procedure Laterality Date  . LEEP     years ago  . NECK SURGERY  2017   with right shoulder surgery     OB History    Gravida  3   Para  2   Term  2   Preterm  0   AB  1   Living  2     SAB  0   IAB  0   Ectopic  0   Multiple  0   Live Births              Family History  Problem Relation Age of Onset  . Lupus Mother   . Pancreatic cancer Father     Social History   Tobacco Use  . Smoking status: Current Every Day Smoker    Packs/day: 0.25    Types: Cigarettes  . Smokeless tobacco: Never Used  . Tobacco comment: 2 per day  Vaping Use  . Vaping Use: Never used  Substance Use Topics  . Alcohol use: No    Alcohol/week: 1.0 standard drink    Types: 1 Standard drinks or equivalent per week  . Drug use: Yes    Types: Marijuana    Home Medications Prior to  Admission medications   Medication Sig Start Date End Date Taking? Authorizing Provider  acetaminophen (TYLENOL) 325 MG tablet Take 2 tablets (650 mg total) by mouth every 6 (six) hours as needed for mild pain (or Fever >/= 101). Patient taking differently: Take 325 mg by mouth every 6 (six) hours as needed for mild pain (or Fever >/= 101). 04/02/20  Yes Sheikh, Omair Latif, DO  ascorbic acid (VITAMIN C) 100 MG tablet Take 100 mg by mouth daily.   Yes [provider]  cetirizine (ZYRTEC) 10 MG tablet Take 10 mg by mouth daily. 02/18/16  Yes [provider]  famotidine (PEPCID) 20 MG tablet Take 20 mg by mouth 2 (two) times daily. 04/15/20  Yes [provider]  medroxyPROGESTERone (DEPO-PROVERA) 400 MG/ML SUSP injection Inject 400 mg into the muscle every 3 (three) months.   Yes [provider]  ondansetron (ZOFRAN) 4 MG tablet Take 1 tablet (4 mg total) by  mouth every 6 (six) hours as needed for nausea. 04/02/20  Yes Sheikh, Omair Latif, DO  vitamin B-12 (CYANOCOBALAMIN) 250 MCG tablet Take 250 mcg by mouth daily.   Yes [provider]  HYDROcodone-acetaminophen (NORCO/VICODIN) 5-325 MG tablet Take 1 tablet by mouth every 6 (six) hours as needed. Patient not taking: No sig reported 04/02/20   Raiford Noble Latif, DO    Allergies    Carbomer  Review of Systems   Review of Systems  Constitutional: Negative for chills and fever.  HENT: Negative for congestion.   Eyes: Negative for pain.  Respiratory: Negative for cough and shortness of breath.   Cardiovascular: Negative for chest pain and leg swelling.  Gastrointestinal: Positive for abdominal pain and nausea. Negative for abdominal distention, diarrhea and vomiting.  Genitourinary: Negative for dysuria.  Musculoskeletal: Negative for myalgias.  Skin: Negative for rash.  Neurological: Negative for dizziness and headaches.    Physical Exam Updated Vital Signs BP 130/80   Pulse (!) 59   Temp 98.3 F (36.8 C)   Resp 18   Ht 5\' 11"  (1.803 m)   Wt 68.5 kg   SpO2 99%   BMI 21.06 kg/m   Physical Exam Vitals and nursing note reviewed.  Constitutional:      General: She is not in acute distress.    Comments: Pleasant well-appearing 50 year old.  In no acute distress.  Sitting comfortably in bed.  Able answer questions appropriately follow commands. No increased work of breathing. Speaking in full sentences.  HENT:     Head: Normocephalic and atraumatic.     Nose: Nose normal.  Eyes:     General: No scleral icterus. Cardiovascular:     Rate and Rhythm: Normal rate and regular rhythm.     Pulses: Normal pulses.     Heart sounds: Normal heart sounds.  Pulmonary:     Effort: Pulmonary effort is normal. No respiratory distress.     Breath sounds: No wheezing.  Abdominal:     Palpations: Abdomen is soft.     Tenderness: There is no abdominal tenderness.     Comments: No  significant abdominal tenderness on examination. No CVA tenderness.  No guarding or rebound.  Musculoskeletal:     Cervical back: Normal range of motion.     Right lower leg: No edema.     Left lower leg: No edema.  Skin:    General: Skin is warm and dry.     Capillary Refill: Capillary refill takes less than 2 seconds.  Neurological:  Mental Status: She is alert. Mental status is at baseline.  Psychiatric:        Mood and Affect: Mood normal.        Behavior: Behavior normal.     ED Results / Procedures / Treatments   Labs (all labs ordered are listed, but only abnormal results are displayed) Labs Reviewed  CBC WITH DIFFERENTIAL/PLATELET - Abnormal; Notable for the following components:      Result Value   Hemoglobin 11.0 (*)    HCT 34.0 (*)    All other components within normal limits  URINALYSIS, ROUTINE W REFLEX MICROSCOPIC - Abnormal; Notable for the following components:   Hgb urine dipstick MODERATE (*)    All other components within normal limits  COMPREHENSIVE METABOLIC PANEL - Abnormal; Notable for the following components:   CO2 21 (*)    Total Protein 8.5 (*)    All other components within normal limits  PREGNANCY, URINE  LIPASE, BLOOD    EKG None  Radiology US PELVIC DOPPLER (TORSION R/O OR MASS ARTERIAL FLOW)  Result Date: 04/20/2020 CLINICAL DATA:  Pelvic pain EXAM: TRANSABDOMINAL AND TRANSVAGINAL ULTRASOUND OF PELVIS DOPPLER ULTRASOUND OF OVARIES TECHNIQUE: Both transabdominal and transvaginal ultrasound examinations of the pelvis were performed. Transabdominal technique was performed for global imaging of the pelvis including uterus, ovaries, adnexal regions, and pelvic cul-de-sac. It was necessary to proceed with endovaginal exam following the transabdominal exam to visualize the uterus endometrium ovaries. Color and duplex Doppler ultrasound was utilized to evaluate blood flow to the ovaries. COMPARISON:  Pelvic MRI 04/01/2020, ultrasound 03/31/2020, CT  03/31/2020 FINDINGS: Uterus Measurements: 7.4 x 3.7 x 4.7 cm = volume: 67.1 mL. No fibroids or other mass visualized. Endometrium Thickness: 2 mm.  Tiny fluid within the fundal endometrial canal Right ovary Measurements: 8 x 5.7 x 6.3 cm = volume: 148.2 mL. Redemonstrated complex right adnexal cystic mass measuring 7.3 by 4.5 x 4.6 cm, prior ultrasound measurements of 6 x 4.3 x 7.2 cm. Contains internal echodense foci and septations. Left ovary Measurements: 2.9 x 1.9 x 2.3 cm = volume: 6.5 mL. Normal appearance/no adnexal mass. Pulsed Doppler evaluation of both ovaries demonstrates normal low-resistance arterial and venous waveforms. Other findings No abnormal free fluid. IMPRESSION: 1. Negative for ovarian torsion. 2. Grossly stable complex cystic lesion in the right adnexa, thought to represent endometriomas on prior MRI, refer to MRI report 04/01/2020 for additional recommendations. Electronically Signed   By: Donavan Foil M.D.   On: 04/20/2020 19:51   US PELVIC COMPLETE WITH TRANSVAGINAL  Result Date: 04/20/2020 CLINICAL DATA:  Pelvic pain EXAM: TRANSABDOMINAL AND TRANSVAGINAL ULTRASOUND OF PELVIS DOPPLER ULTRASOUND OF OVARIES TECHNIQUE: Both transabdominal and transvaginal ultrasound examinations of the pelvis were performed. Transabdominal technique was performed for global imaging of the pelvis including uterus, ovaries, adnexal regions, and pelvic cul-de-sac. It was necessary to proceed with endovaginal exam following the transabdominal exam to visualize the uterus endometrium ovaries. Color and duplex Doppler ultrasound was utilized to evaluate blood flow to the ovaries. COMPARISON:  Pelvic MRI 04/01/2020, ultrasound 03/31/2020, CT 03/31/2020 FINDINGS: Uterus Measurements: 7.4 x 3.7 x 4.7 cm = volume: 67.1 mL. No fibroids or other mass visualized. Endometrium Thickness: 2 mm.  Tiny fluid within the fundal endometrial canal Right ovary Measurements: 8 x 5.7 x 6.3 cm = volume: 148.2 mL. Redemonstrated  complex right adnexal cystic mass measuring 7.3 by 4.5 x 4.6 cm, prior ultrasound measurements of 6 x 4.3 x 7.2 cm. Contains internal echodense foci and septations. Left  ovary Measurements: 2.9 x 1.9 x 2.3 cm = volume: 6.5 mL. Normal appearance/no adnexal mass. Pulsed Doppler evaluation of both ovaries demonstrates normal low-resistance arterial and venous waveforms. Other findings No abnormal free fluid. IMPRESSION: 1. Negative for ovarian torsion. 2. Grossly stable complex cystic lesion in the right adnexa, thought to represent endometriomas on prior MRI, refer to MRI report 04/01/2020 for additional recommendations. Electronically Signed   By: Donavan Foil M.D.   On: 04/20/2020 19:51    Procedures Procedures   Medications Ordered in ED Medications  oxyCODONE-acetaminophen (PERCOCET/ROXICET) 5-325 MG per tablet 1 tablet (1 tablet Oral Given 04/20/20 1859)  ketorolac (TORADOL) 30 MG/ML injection 30 mg (30 mg Intravenous Given 04/20/20 2115)  ondansetron (ZOFRAN) injection 4 mg (4 mg Intravenous Given 04/20/20 2116)    ED Course  I have reviewed the triage vital signs and the nursing notes.  Pertinent labs & imaging results that were available during my care of the patient were reviewed by me and considered in my medical decision making (see chart for details).    MDM Rules/Calculators/A&P                          Patient is 50 year old female with history of adnexal cystic lesion this is been evaluated by MRI in the past and was found to be an endometrioma she is presented with right pelvic pain.  No vaginal discharge no vaginal bleeding.  She was evaluated in triage and had ultrasound to rule out torsion obtained which is currently high of the differential given her severe pain and her history of a cyst which could cause a bit of point for torsion.  She is presently experiencing very minimal pain.  She states that she was given a Percocet now in the waiting room and feels significantly better.   Urine pregnancy test negative doubt ectopic pregnancy.  We are still waiting for lab work to resolved.  Will provide patient with 1 dose of Toradol given that her kidney function on last CMP was within normal limits.  We will also give her Zofran for nausea.  CBC without leukocytosis.  Mild anemia no significant changes.  Lipase was normal limits which indicates that her pancreatitis from her last hospital stay has seemingly resolved.  CMP unremarkable.  Urinalysis without any evidence of infection there is hemoglobin present but no evidence of infection.  Ultrasounds personally reviewed does not appear to have any evidence of torsion.  Given her exam presently I do not believe she needs any additional imaging.  Doubt appendicitis given her lack of leukocytosis and her lack of symptoms presently and benign abdominal exam.  Recommend close follow-up with PCP and OB/GYN.  She is agreeable to plan.  Will discharge with recommendations for Tylenol and ibuprofen at home.  Final Clinical Impression(s) / ED Diagnoses Final diagnoses:  Pelvic pain  Cyst of right ovary    Rx / DC Orders ED Discharge Orders    None       Tedd Sias, [place] 04/21/20 Willa Frater, MD 04/23/20 1008

## 2020-04-20 NOTE — Discharge Instructions (Addendum)
Please follow-up with your OB/GYN for continued care for your ovarian cyst.   Please use Tylenol or ibuprofen for pain.  You may use 600 mg ibuprofen every 6 hours or 1000 mg of Tylenol every 6 hours.  You may choose to alternate between the 2.  This would be most effective.  Not to exceed 4 g of Tylenol within 24 hours.  Not to exceed 3200 mg ibuprofen 24 hours.

## 2020-04-20 NOTE — ED Triage Notes (Signed)
Pt reports recent diagnosis of ovarian cyst. Pt endorses lower abdominal pain x1 week. Pt reports taking Tylenol without relief. Denies vaginal bleeding.

## 2020-04-21 ENCOUNTER — Telehealth: Payer: Self-pay | Admitting: Obstetrics and Gynecology

## 2020-04-21 ENCOUNTER — Encounter: Payer: Self-pay | Admitting: Obstetrics and Gynecology

## 2020-04-21 DIAGNOSIS — R102 Pelvic and perineal pain: Secondary | ICD-10-CM

## 2020-04-21 MED ORDER — KETOROLAC TROMETHAMINE 10 MG PO TABS: 10.0000 mg | ORAL_TABLET | Freq: Four times a day (QID) | ORAL | 0 refills | Status: DC | PRN

## 2020-04-21 MED ORDER — ACETAMINOPHEN 500 MG PO TABS: 500.0000 mg | ORAL_TABLET | Freq: Four times a day (QID) | ORAL | 0 refills | Status: DC | PRN

## 2020-04-21 NOTE — Telephone Encounter (Signed)
Patient called answering service. States she was in the emergency room last night for pelvic pain. Her pain improved with medication at the hospital but now on waking has increased again. She just took 400mg  ibuprofen. She wants to know when she can schedule her surgery with Dr Sabra Heck. Patient was informed that Dr Sabra Heck is not on call today and can discuss plan with her regarding surgery next week.  Review of pelvic US done in ER last night shows stable endometrioma of the right ovary with no evidence of torsion. I discussed with the patient that that if her symptoms worsen despite medication, or if she develops nausea/ vomiting, fever or other concerning symptoms then she would need to return to the ER for evaluation. She expressed understanding. She states that she does not have these symptoms at this time but is in pain. The medication she was given in the IV at the ER helped her symptoms. She was given percocet orally and IV toradol. I will prescribe 10mg  toradol tablets q6hrs. She has 325mg  tylenol at home, so will also prescribe 500mg  tylenol instead to take every 6 hours.   Jaquita Folds, MD

## 2020-04-23 ENCOUNTER — Encounter (HOSPITAL_BASED_OUTPATIENT_CLINIC_OR_DEPARTMENT_OTHER): Payer: Self-pay

## 2020-04-23 LAB — CYTOLOGY - PAP
Comment: NEGATIVE
Diagnosis: NEGATIVE
Diagnosis: REACTIVE
High risk HPV: NEGATIVE

## 2020-04-27 NOTE — Telephone Encounter (Signed)
This was handled in a separate message to the patient on 04/23/20. KW CMA

## 2020-05-15 DIAGNOSIS — R319 Hematuria, unspecified: Secondary | ICD-10-CM | POA: Diagnosis not present

## 2020-05-15 DIAGNOSIS — I10 Essential (primary) hypertension: Secondary | ICD-10-CM | POA: Diagnosis not present

## 2020-05-15 DIAGNOSIS — Z23 Encounter for immunization: Secondary | ICD-10-CM | POA: Diagnosis not present

## 2020-05-25 ENCOUNTER — Telehealth: Payer: Self-pay | Admitting: *Deleted

## 2020-05-25 NOTE — Telephone Encounter (Signed)
Return call to patient. Patient has travel plans in May and would like surgery to be in June.  Advised potential date is June 7, still working with Dr Sabra Heck to finalize. Patient agreeable to 06-26-20.

## 2020-06-04 ENCOUNTER — Telehealth: Payer: Self-pay | Admitting: *Deleted

## 2020-06-04 ENCOUNTER — Encounter (HOSPITAL_BASED_OUTPATIENT_CLINIC_OR_DEPARTMENT_OTHER): Payer: Self-pay | Admitting: *Deleted

## 2020-06-04 NOTE — Telephone Encounter (Signed)
See phone encounter on 06-04-20.  Encounter closed.

## 2020-06-04 NOTE — Telephone Encounter (Signed)
[name]. Left message surgery scheduled on 08-01-20 at 1300, arrive 1100. Left message to call back to [phone] for additional instructions. No patient identifiers left on message.

## 2020-06-06 NOTE — Telephone Encounter (Signed)
[name]. Letter has been mailed. Left message calling to confirm receipt of date info. Call back if any questions.   Encounter closed.

## 2020-06-08 ENCOUNTER — Telehealth: Payer: Self-pay | Admitting: *Deleted

## 2020-06-08 NOTE — Telephone Encounter (Signed)
[name]. Confirms receipts of message for surgery date. Advised need Hysterectomy Statement completed for insurance company. Patient traveling next week so will plan to come first week of June. Advised to call before coming to be sure I am available. Patient agrees.

## 2020-06-19 ENCOUNTER — Other Ambulatory Visit: Payer: Self-pay

## 2020-06-19 DIAGNOSIS — K862 Cyst of pancreas: Secondary | ICD-10-CM

## 2020-06-19 DIAGNOSIS — K859 Acute pancreatitis without necrosis or infection, unspecified: Secondary | ICD-10-CM

## 2020-06-19 NOTE — Addendum Note (Signed)
Addended by: Darrall Dears on: 06/19/2020 08:41 AM   Modules accepted: Orders

## 2020-06-23 ENCOUNTER — Ambulatory Visit (HOSPITAL_COMMUNITY): Payer: Medicaid Other

## 2020-06-25 DIAGNOSIS — Z3002 Counseling and instruction in natural family planning to avoid pregnancy: Secondary | ICD-10-CM | POA: Diagnosis not present

## 2020-06-25 DIAGNOSIS — Z3042 Encounter for surveillance of injectable contraceptive: Secondary | ICD-10-CM | POA: Diagnosis not present

## 2020-06-29 ENCOUNTER — Other Ambulatory Visit: Payer: Self-pay

## 2020-06-29 ENCOUNTER — Other Ambulatory Visit: Payer: Self-pay | Admitting: Physician Assistant

## 2020-06-29 ENCOUNTER — Ambulatory Visit (HOSPITAL_COMMUNITY)
Admission: RE | Admit: 2020-06-29 | Discharge: 2020-06-29 | Disposition: A | Discharge status: Home / Self Care | Payer: Medicaid Other | Source: Ambulatory Visit | Attending: Physician Assistant | Admitting: Physician Assistant

## 2020-06-29 ENCOUNTER — Ambulatory Visit (HOSPITAL_COMMUNITY): Payer: Medicaid Other

## 2020-06-29 DIAGNOSIS — K862 Cyst of pancreas: Secondary | ICD-10-CM | POA: Diagnosis present

## 2020-06-29 DIAGNOSIS — K859 Acute pancreatitis without necrosis or infection, unspecified: Secondary | ICD-10-CM

## 2020-06-29 DIAGNOSIS — K7689 Other specified diseases of liver: Secondary | ICD-10-CM | POA: Diagnosis not present

## 2020-06-29 DIAGNOSIS — N281 Cyst of kidney, acquired: Secondary | ICD-10-CM | POA: Diagnosis not present

## 2020-06-29 IMAGING — MR MR ABDOMEN WO/W CM
19 of 21 series · 43 of 48 positions shown · IV contrast ([ID] GADAVIST)
Comparison: MRI [DATE]

CLINICAL DATA: Follow-up pancreatic pseudocyst. History of prior
pancreatitis.

EXAM:
MRI ABDOMEN WITHOUT AND WITH CONTRAST
TECHNIQUE: Multiplanar multisequence MR imaging of the abdomen was performed
both before and after the administration of intravenous contrast.
CONTRAST:  7.5mL GADAVIST GADOBUTROL 1 MMOL/ML IV SOLN

[Series 3: T2 · coronal · 7.0mm · 1.56mm/px · 2 of 30 slices shown (1 of 2)]
[im 1/30]
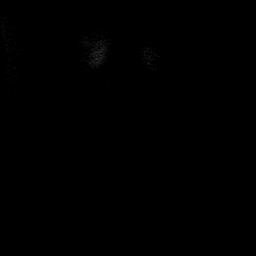
[im 30/30]
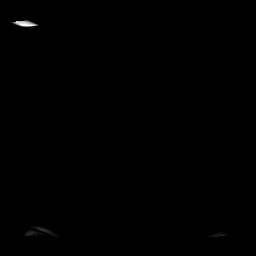

[Series 4: T2 fat-sat · axial · 6.0mm · 1.25mm/px · 1 of 36 slices shown]
[im 1/36]
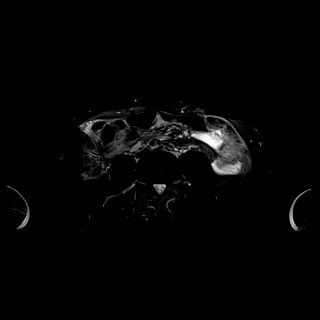

[Series 6: DWI · axial · 6.0mm · 1.49mm/px · z∈[-104,+148]mm · 2 of 72 slices shown (1 of 2)]
[im 1/72]
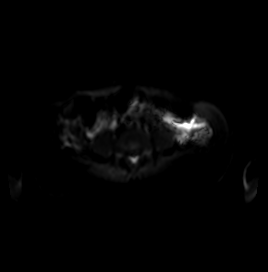
[im 72/72]
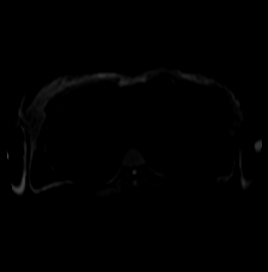

[Series 7: DWI · axial · 6.0mm · 1.49mm/px · 1 of 36 slices shown (2 of 2)]
[im 1/36]
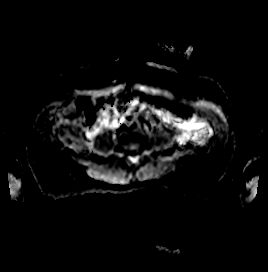

[Series 8: T1 · axial · 3.1mm · 1.25mm/px · z∈[-133,+136]mm · 3 of 88 slices shown (1 of 2)]
[im 1/88]
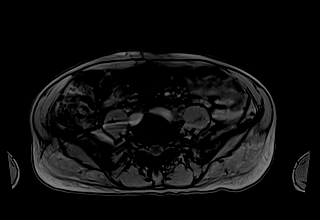
[im 44/88]
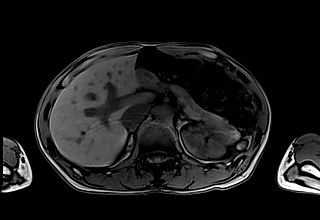
[im 88/88]
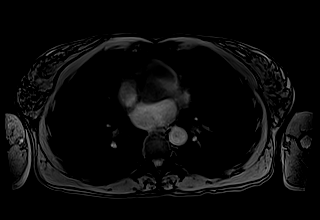

[Series 9: T1 · axial · 3.1mm · 1.25mm/px · z∈[-133,+136]mm · 3 of 88 slices shown (2 of 2)]
[im 1/88]
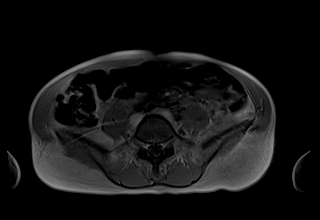
[im 44/88]
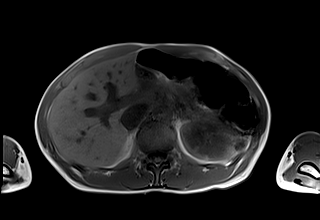
[im 88/88]
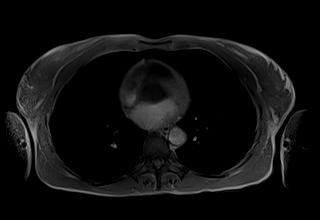

[Series 10: cor obl thk · sagittal · 50.0mm · 0.78mm/px · 1 of 8 slices shown]
[im 1/8]
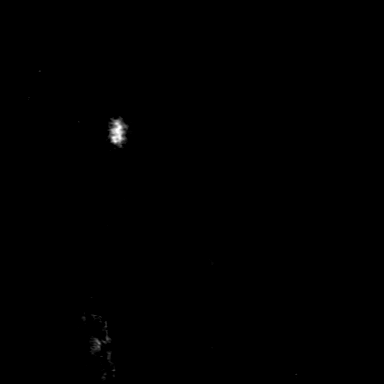

[Series 12: cor_3d_spc_trig · coronal · 1.0mm · 0.49mm/px · 2 of 72 slices shown]
[im 1/72]
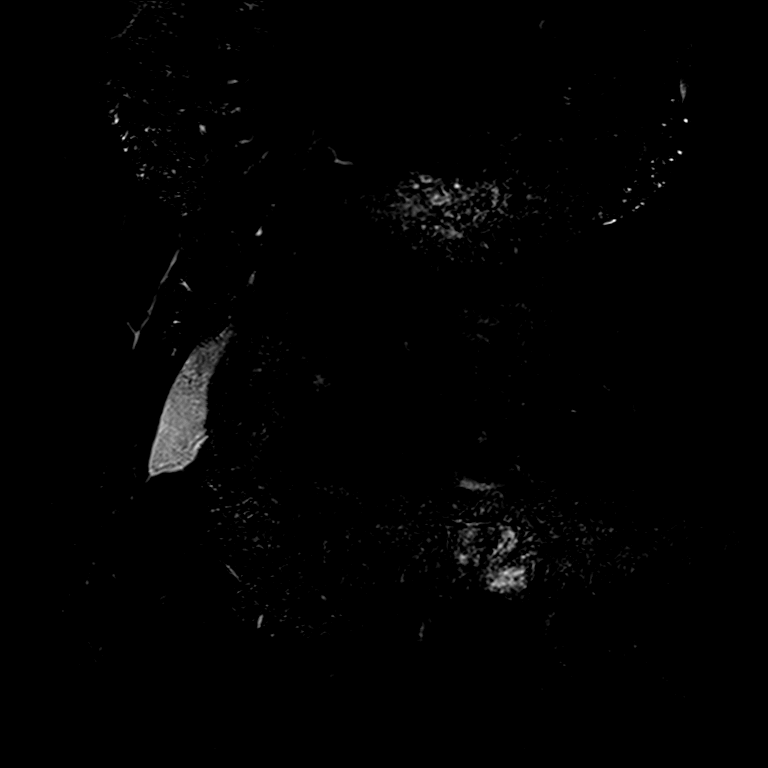
[im 72/72]
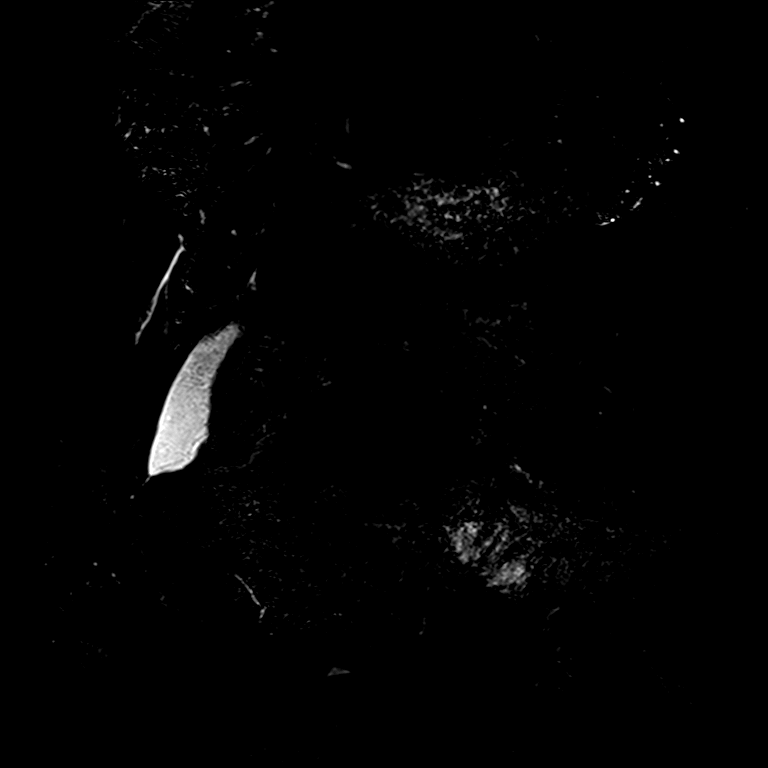

[Series 14: bSSFP · axial · 7.0mm · 1.25mm/px · 1 of 32 slices shown]
[im 1/32]
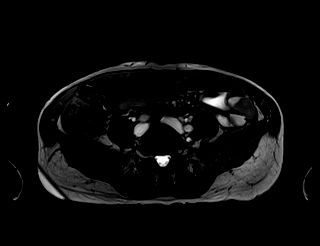

[Series 17: T1 dynamic · axial · 3.0mm · 1.25mm/px · z∈[-139,+122]mm · 3 of 88 slices shown (1 of 9)]
[im 1/88]
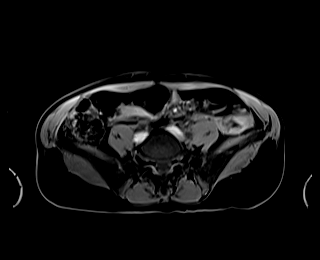
[im 44/88]
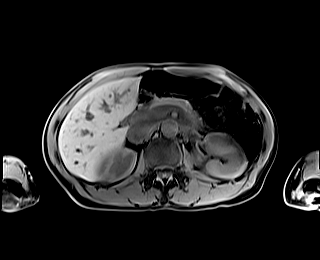
[im 88/88]
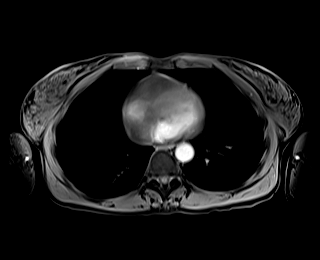

[Series 21: T1 dynamic · axial · 3.0mm · 1.25mm/px · z∈[-139,+122]mm · 3 of 88 slices shown (2 of 9)]
[im 1/88]
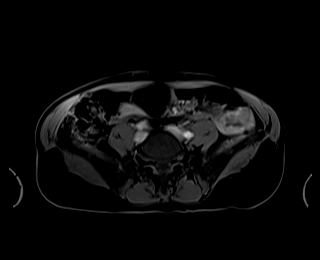
[im 44/88]
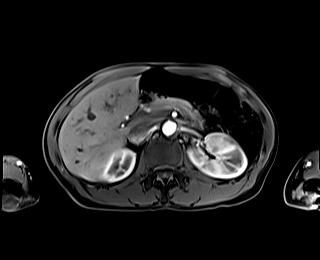
[im 88/88]
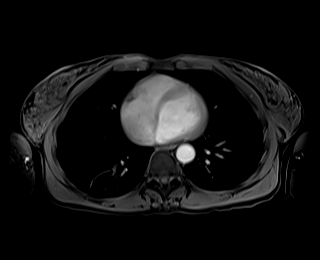

[Series 22: T1 dynamic · axial · 3.0mm · 1.25mm/px · z∈[-139,+122]mm · 3 of 88 slices shown (3 of 9)]
[im 1/88]
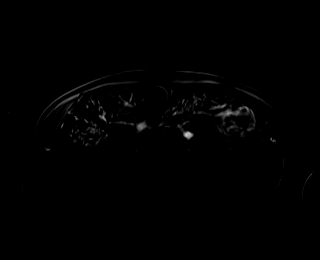
[im 44/88]
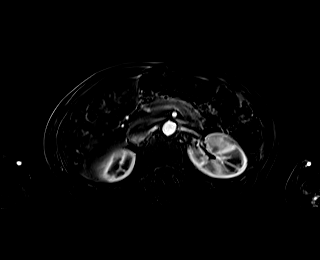
[im 88/88]
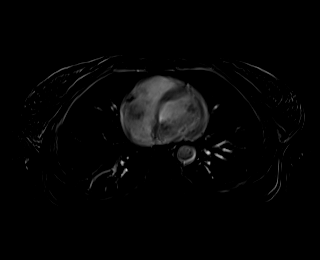

[Series 25: T1 dynamic · axial · 3.0mm · 1.25mm/px · z∈[-139,+122]mm · 3 of 88 slices shown (4 of 9)]
[im 1/88]
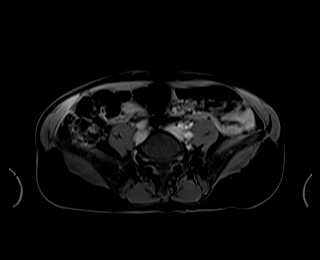
[im 44/88]
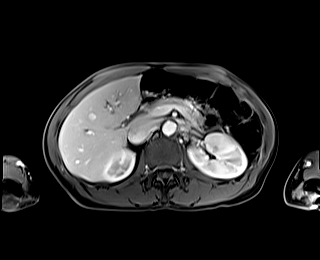
[im 88/88]
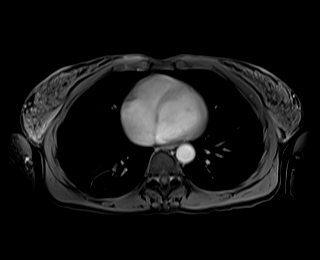

[Series 26: T1 dynamic · axial · 3.0mm · 1.25mm/px · z∈[-139,+122]mm · 3 of 88 slices shown (5 of 9)]
[im 1/88]
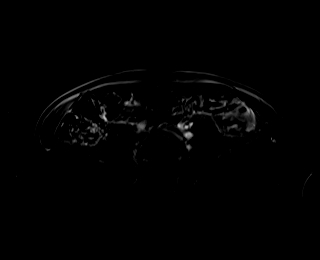
[im 44/88]
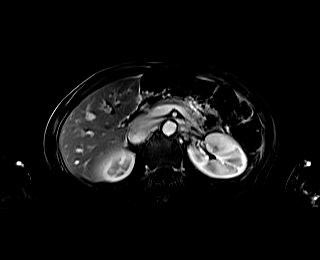
[im 88/88]
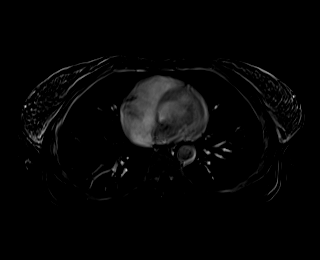

[Series 29: T1 dynamic · axial · 3.0mm · 1.25mm/px · z∈[-139,+122]mm · 3 of 88 slices shown (6 of 9)]
[im 1/88]
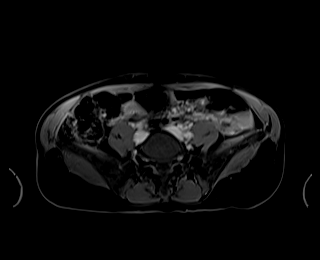
[im 44/88]
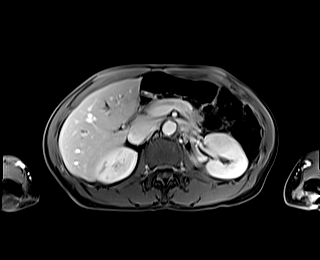
[im 88/88]
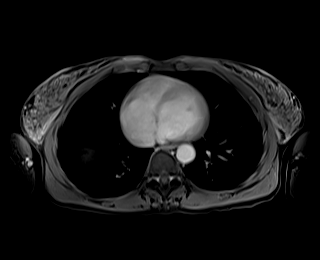

[Series 30: T1 dynamic · axial · 3.0mm · 1.25mm/px · z∈[-139,+122]mm · 3 of 88 slices shown (7 of 9)]
[im 1/88]
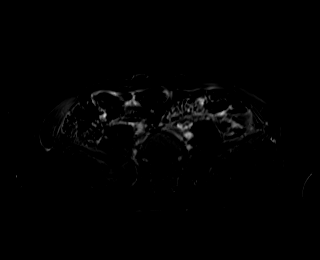
[im 44/88]
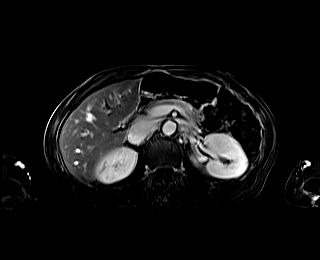
[im 88/88]
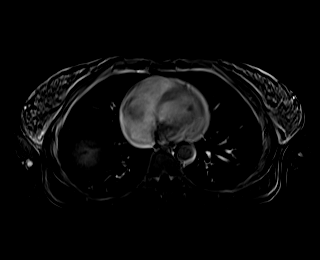

[Series 32: T1 dynamic · coronal · 3.0mm · 1.41mm/px · 2 of 72 slices shown (8 of 9)]
[im 1/72]
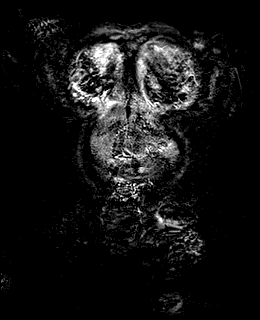
[im 72/72]
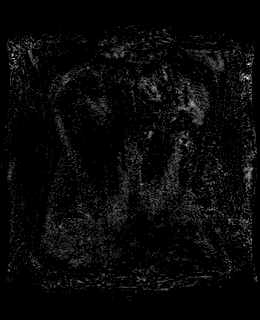

[Series 33: T2 · axial · 6.0mm · 1.56mm/px · 1 of 36 slices shown (2 of 2)]
[im 1/36]
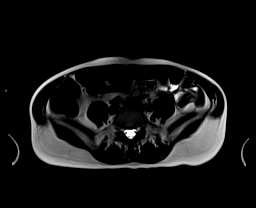

[Series 37: T1 dynamic · axial · 3.0mm · 1.25mm/px · z∈[-139,+122]mm · 3 of 88 slices shown (9 of 9)]
[im 1/88]
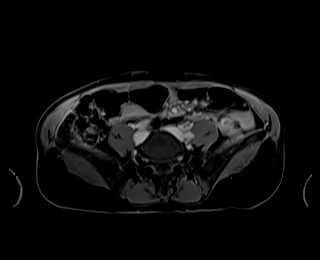
[im 44/88]
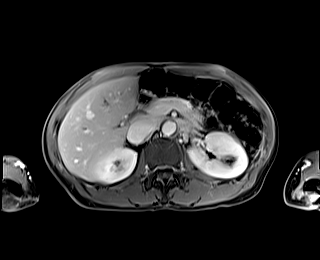
[im 88/88]
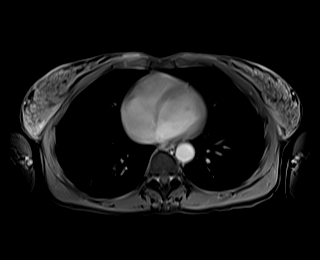

[43 of 48 positions shown; findings below may reference images not displayed]

FINDINGS: Lower chest: The lung bases are unremarkable. No pulmonary lesions
or pleural or pericardial effusion.

Hepatobiliary: There is an early arterial phase enhancing lesion in
segment 8 of the liver which measures a maximum 14 mm on image
19/21. This is mildly apparent on the T2 weighted images with vague
hyperintensity. Central focus of non enhancement persists on the
delayed images. In retrospect I do not see this on the prior MRI
examination for certain. It is possible this is an inflammatory
process. FNH is another possibility but I am not sure why it was not
present on the prior study. No other hepatic lesions are identified.
No intrahepatic biliary dilatation. Normal caliber and course of the
common bile duct. The gallbladder is unremarkable.

Pancreas: No mass, inflammation or ductal dilatation. The
abnormality seen on the prior MRI has resolved and was likely small
pseudocysts.

Spleen:  Normal size.  No focal lesions.

Adrenals/Urinary Tract: Adrenal glands and kidneys are unremarkable.
Small renal cysts are noted.

Stomach/Bowel: Stomach, duodenum, visualized small bowel and
visualized colon are grossly normal.

Vascular/Lymphatic: The aorta and branch vessels are patent. The
major venous structures are patent. No mesenteric or retroperitoneal
mass or adenopathy.

Other:  No ascites or abdominal wall hernia.

Musculoskeletal: No significant bony findings.
IMPRESSION: 1. 14 mm early arterial phase enhancing lesion in segment 8 of the
liver. In retrospect I do not see this on the prior MRI examination
for certain. It is possible this is an inflammatory process. FNH is
another possibility but I would expect that to be present on the
prior study. I would recommend a follow-up MR abdomen without and
with contrast in 4-6 months to reassess.
2. No other hepatic lesions are identified.
3. Normal appendix. The abnormality seen on the prior MRI has
resolved and was likely small pseudocysts.
4. Small renal cysts.

## 2020-06-29 MED ORDER — GADOBUTROL 1 MMOL/ML IV SOLN
7.5000 mL | Freq: Once | INTRAVENOUS | Status: AC | PRN
  Administered 2020-06-29: 7.5 mL via INTRAVENOUS

## 2020-07-02 ENCOUNTER — Encounter (HOSPITAL_BASED_OUTPATIENT_CLINIC_OR_DEPARTMENT_OTHER): Payer: Medicaid Other | Admitting: Obstetrics & Gynecology

## 2020-07-02 NOTE — Telephone Encounter (Signed)
Message from [place] office- patient will come to [facility] office on Thursday, 6-16 to sign consent.   Encounter closed.

## 2020-07-02 NOTE — Telephone Encounter (Signed)
Follow-up call to patient regarding hysterectomy consent for insurance. Voice mail has number confirmation. Left message calling to follow-up on consent. Can come to [address] location or [facility] location but need to know when to expect her.

## 2020-07-05 ENCOUNTER — Encounter (HOSPITAL_BASED_OUTPATIENT_CLINIC_OR_DEPARTMENT_OTHER): Payer: Self-pay | Admitting: Obstetrics & Gynecology

## 2020-07-05 ENCOUNTER — Encounter: Payer: Self-pay | Admitting: *Deleted

## 2020-07-05 ENCOUNTER — Other Ambulatory Visit: Payer: Self-pay

## 2020-07-05 ENCOUNTER — Ambulatory Visit (INDEPENDENT_AMBULATORY_CARE_PROVIDER_SITE_OTHER): Payer: Medicaid Other | Admitting: Obstetrics & Gynecology

## 2020-07-05 ENCOUNTER — Other Ambulatory Visit (HOSPITAL_COMMUNITY)
Admission: RE | Admit: 2020-07-05 | Discharge: 2020-07-05 | Disposition: A | Discharge status: Home / Self Care | Payer: Medicaid Other | Source: Ambulatory Visit | Attending: Obstetrics & Gynecology | Admitting: Obstetrics & Gynecology

## 2020-07-05 VITALS — BP 134/82 | HR 78 | Ht 71.0 in | Wt 151.0 lb

## 2020-07-05 DIAGNOSIS — R102 Pelvic and perineal pain: Secondary | ICD-10-CM

## 2020-07-05 DIAGNOSIS — Z113 Encounter for screening for infections with a predominantly sexual mode of transmission: Secondary | ICD-10-CM

## 2020-07-05 DIAGNOSIS — N926 Irregular menstruation, unspecified: Secondary | ICD-10-CM | POA: Insufficient documentation

## 2020-07-05 DIAGNOSIS — N898 Other specified noninflammatory disorders of vagina: Secondary | ICD-10-CM

## 2020-07-05 DIAGNOSIS — N801 Endometriosis of ovary: Secondary | ICD-10-CM

## 2020-07-05 DIAGNOSIS — N80129 Deep endometriosis of ovary, unspecified ovary: Secondary | ICD-10-CM

## 2020-07-05 MED ORDER — KETOROLAC TROMETHAMINE 10 MG PO TABS: 10.0000 mg | ORAL_TABLET | Freq: Four times a day (QID) | ORAL | 0 refills | Status: DC | PRN

## 2020-07-05 NOTE — Patient Instructions (Signed)
Miralax

## 2020-07-05 NOTE — Progress Notes (Signed)
50 y.o. [id number] Single Black or [place] American female here for discussion of upcoming procedure.  TLH/probable BSO/cystoscopy/possible TAH planned due to endometrioma and chronic pelvic pain.  Pt has endometrioma on right ovary that has been present for several years, noted in prior imaging in [facility].  Ultrasound 04/20/2020 showed normal uterus. Right ovary was 6 x 4.3 x 7.2cm c/w endometrioma.  Pt has undergone MRI showing this is most likely an endometrioma.    She did have acute pancreatitis with hospitalization in March.  Follow up MRI done early June with resolution of pseudocyst.  Pt's patient is fully resolved.  Have communicated with [facility] and there is no recommendation for this hx and surgical timing as long as patient is clinically improved.    Procedure discussed with patient.  Hospital stay, recovery and pain management all discussed.  Risks discussed including but not limited to bleeding, 1% risk of receiving a  transfusion, infection, 3-4% risk of bowel/bladder/ureteral/vascular injury discussed as well as possible need for additional surgery if injury does occur discussed.  DVT/PE and rare risk of death discussed.  My actual complications with prior surgeries discussed.  Vaginal cuff dehiscence discussed.  Hernia formation discussed.  Positioning and incision locations discussed.  Patient aware if pathology abnormal she may need additional treatment.  All questions answered.     Ob Hx:   No LMP recorded. Patient has had an injection.          Sexually active: Yes.   Birth control: Depo-Provera Last pap: 03/2020 Last MMG: 03/2019 Smoking:  decreased to two weeks ago  Past Surgical History:  Procedure Laterality Date   LEEP     years ago   NECK SURGERY  2017   with right shoulder surgery    Past Medical History:  Diagnosis Date   Hypertension     Allergies: Carbomer  Current Outpatient Medications  Medication Sig Dispense Refill   acetaminophen (TYLENOL) 325 MG tablet  Take 2 tablets (650 mg total) by mouth every 6 (six) hours as needed for mild pain (or Fever >/= 101). (Patient taking differently: Take 325 mg by mouth every 6 (six) hours as needed for mild pain (or Fever >/= 101).) 20 tablet 0   acetaminophen (TYLENOL) 500 MG tablet Take 1 tablet (500 mg total) by mouth every 6 (six) hours as needed (pain). 60 tablet 0   ascorbic acid (VITAMIN C) 100 MG tablet Take 100 mg by mouth daily.     cetirizine (ZYRTEC) 10 MG tablet Take 10 mg by mouth daily.     famotidine (PEPCID) 20 MG tablet Take 20 mg by mouth 2 (two) times daily.     HYDROcodone-acetaminophen (NORCO/VICODIN) 5-325 MG tablet Take 1 tablet by mouth every 6 (six) hours as needed. (Patient not taking: No sig reported) 10 tablet 0   ketorolac (TORADOL) 10 MG tablet Take 1 tablet (10 mg total) by mouth every 6 (six) hours as needed (pain). 60 tablet 0   medroxyPROGESTERone (DEPO-PROVERA) 400 MG/ML SUSP injection Inject 400 mg into the muscle every 3 (three) months.     ondansetron (ZOFRAN) 4 MG tablet Take 1 tablet (4 mg total) by mouth every 6 (six) hours as needed for nausea. 20 tablet 0   vitamin B-12 (CYANOCOBALAMIN) 250 MCG tablet Take 250 mcg by mouth daily.     No current facility-administered medications for this visit.    ROS: A comprehensive review of systems was negative except for: pelvic pain and irregular bleeding  Exam:  BP 134/82 (BP Location: Right Arm, Patient Position: Sitting, Cuff Size: Small)   Pulse 78   Ht 5\' 11"  (1.803 m) Comment: reported  Wt 151 lb (68.5 kg)   BMI 21.06 kg/m   General appearance: alert and cooperative Head: Normocephalic, without obvious abnormality, atraumatic Neck: no adenopathy, supple, symmetrical, trachea midline and thyroid not enlarged, symmetric, no tenderness/mass/nodules Lungs: clear to auscultation bilaterally Heart: regular rate and rhythm, S1, S2 normal, no murmur, click, rub or gallop Abdomen: soft, non-tender; bowel sounds normal; no  masses,  no organomegaly Extremities: extremities normal, atraumatic, no cyanosis or edema Skin: Skin color, texture, turgor normal. No rashes or lesions Lymph nodes: Cervical, supraclavicular, and axillary nodes normal. no inguinal nodes palpated Neurologic: Grossly normal  Pelvic: External genitalia:  no lesions              Urethra: normal appearing urethra with no masses, tenderness or lesions              Bartholins and Skenes: normal                 Vagina: normal appearing vagina with normal color and discharge, no lesions              Cervix: normal appearance              Pap taken: No.        Bimanual Exam:  Uterus:  uterus is normal size, shape, consistency and nontender                                      Adnexa:    not indicated                                      Rectovaginal: Deferred                                      Anus:  normal sphincter tone, no lesions  Chaperone, Octaviano Batty, CMA, present for examination.   Assessment/Plan: 1. Endometrioma of ovary - TLH with RSO possible LSO, cystoscopy, possible TAH planned.  Pre op planning discussed.  2. Pelvic pain - ketorolac (TORADOL) 10 MG tablet; Take 1 tablet (10 mg total) by mouth every 6 (six) hours as needed (pain).  Dispense: 60 tablet; Refill: 0  3. Irregular bleeding/discharge - will test for pre op BV  4. Screening examination for STD (sexually transmitted disease) - GC/Chl screening obtained

## 2020-07-06 ENCOUNTER — Other Ambulatory Visit (HOSPITAL_BASED_OUTPATIENT_CLINIC_OR_DEPARTMENT_OTHER): Payer: Self-pay | Admitting: Obstetrics & Gynecology

## 2020-07-06 LAB — CERVICOVAGINAL ANCILLARY ONLY
Bacterial Vaginitis (gardnerella): POSITIVE — AB
Candida Glabrata: NEGATIVE
Candida Vaginitis: NEGATIVE
Comment: NEGATIVE
Comment: NEGATIVE
Comment: NEGATIVE

## 2020-07-09 ENCOUNTER — Telehealth (HOSPITAL_BASED_OUTPATIENT_CLINIC_OR_DEPARTMENT_OTHER): Payer: Self-pay

## 2020-07-09 ENCOUNTER — Telehealth: Payer: Self-pay | Admitting: Physician Assistant

## 2020-07-09 ENCOUNTER — Ambulatory Visit: Payer: Medicaid Other | Admitting: Gastroenterology

## 2020-07-09 DIAGNOSIS — B9689 Other specified bacterial agents as the cause of diseases classified elsewhere: Secondary | ICD-10-CM

## 2020-07-09 MED ORDER — METRONIDAZOLE 500 MG PO TABS: 500.0000 mg | ORAL_TABLET | Freq: Two times a day (BID) | ORAL | 0 refills | Status: DC

## 2020-07-09 NOTE — Telephone Encounter (Signed)
Patient called needing to cancel the appointment for today because her grandson is sick and she had no sitter. She is requesting a call from nurse to advise on the abdominal pain is having.

## 2020-07-09 NOTE — Telephone Encounter (Signed)
Attempt made to contact Mary Ramos is a 50 y.o. female re: message from Dr. Sabra Heck below.  Pt was not available.  LM on the VM for the patient to call me back re: message below from Dr. Sabra Heck. Flagyl Rx sent to the pharmacy: [facility] on [address] -----   Mary Ramos is a 50 y.o. female contacted

## 2020-07-09 NOTE — Telephone Encounter (Signed)
Lm on vm for patient to return call 

## 2020-07-09 NOTE — Telephone Encounter (Signed)
-----   Message from Megan Salon, MD sent at 07/08/2020 12:01 PM EDT ----- Please let pt know her vaginitis testing showed BV. She needs to treat with flagyl 500mg  bid x 7 days or metrogel 0.75%nightly x 5 nights.

## 2020-07-11 NOTE — Telephone Encounter (Signed)
Lm on vm for patient to return call 

## 2020-07-13 NOTE — Telephone Encounter (Signed)
No return call received.

## 2020-07-27 NOTE — Pre-Procedure Instructions (Signed)
Surgical Instructions    Your procedure is scheduled on Wednesday July 13th.  Report to [facility] Main Entrance "A" at 11:00 A.M., then check in with the Admitting office.  Call this number if you have problems the morning of surgery:  [phone]   If you have any questions prior to your surgery date call [phone]: Open Monday-Friday 8am-4pm    Remember:  Do not eat after midnight the night before your surgery  You may drink clear liquids until 10:00 A.M. the morning of your surgery.   Clear liquids allowed are: Water, Non-Citrus Juices (without pulp), Carbonated Beverages, Clear Tea, Black Coffee Only, and Gatorade    Take these medicines the morning of surgery with A SIP OF WATER   famotidine (PEPCID)- If needed  ketorolac (TORADOL)- If needed  ondansetron [facility])- If needed     As of today, STOP taking any Aspirin (unless otherwise instructed by your surgeon) Aleve, Naproxen, Ibuprofen, Motrin, Advil, Goody's, BC's, all herbal medications, fish oil, and all vitamins.                     Do NOT Smoke (Tobacco/Vaping) or drink Alcohol 24 hours prior to your procedure.  If you use a CPAP at night, you may bring all equipment for your overnight stay.   Contacts, glasses, piercing's, hearing aid's, dentures or partials may not be worn into surgery, please bring cases for these belongings.    For patients admitted to the hospital, discharge time will be determined by your treatment team.   Patients discharged the day of surgery will not be allowed to drive home, and someone needs to stay with them for 24 hours.  ONLY 1 SUPPORT PERSON MAY BE PRESENT WHILE YOU ARE IN SURGERY. IF YOU ARE TO BE ADMITTED ONCE YOU ARE IN YOUR ROOM YOU WILL BE ALLOWED TWO (2) VISITORS.  Minor children may have two parents present. Special consideration for safety and communication needs will be reviewed on a case by case basis.   Special instructions:   [place]- Preparing For  Surgery  Before surgery, you can play an important role. Because skin is not sterile, your skin needs to be as free of germs as possible. You can reduce the number of germs on your skin by washing with CHG (chlorahexidine gluconate) Soap before surgery.  CHG is an antiseptic cleaner which kills germs and bonds with the skin to continue killing germs even after washing.    Oral Hygiene is also important to reduce your risk of infection.  Remember - BRUSH YOUR TEETH THE MORNING OF SURGERY WITH YOUR REGULAR TOOTHPASTE  Please do not use if you have an allergy to CHG or antibacterial soaps. If your skin becomes reddened/irritated stop using the CHG.  Do not shave (including legs and underarms) for at least 48 hours prior to first CHG shower. It is OK to shave your face.  Please follow these instructions carefully.   Shower the NIGHT BEFORE SURGERY and the MORNING OF SURGERY  If you chose to wash your hair, wash your hair first as usual with your normal shampoo.  After you shampoo, rinse your hair and body thoroughly to remove the shampoo.  Use CHG Soap as you would any other liquid soap. You can apply CHG directly to the skin and wash gently with a scrungie or a clean washcloth.   Apply the CHG Soap to your body ONLY FROM THE NECK DOWN.  Do not use on open wounds or  open sores. Avoid contact with your eyes, ears, mouth and genitals (private parts). Wash Face and genitals (private parts)  with your normal soap.   Wash thoroughly, paying special attention to the area where your surgery will be performed.  Thoroughly rinse your body with warm water from the neck down.  DO NOT shower/wash with your normal soap after using and rinsing off the CHG Soap.  Pat yourself dry with a CLEAN TOWEL.  Wear CLEAN PAJAMAS to bed the night before surgery  Place CLEAN SHEETS on your bed the night before your surgery  DO NOT SLEEP WITH PETS.   Day of Surgery: Shower with CHG soap. Do not wear jewelry,  make up, nail polish, gel polish, artificial nails, or any other type of covering on natural nails including finger and toenails. If patients have artificial nails, gel coating, etc. that need to be removed by a nail salon please have this removed prior to surgery. Surgery may need to be canceled/delayed if the surgeon/ anesthesia feels like the patient is unable to be adequately monitored. Do not wear lotions, powders, perfumes/colognes, or deodorant. Do not shave 48 hours prior to surgery.   Do not bring valuables to the hospital. [facility] is not responsible for any belongings or valuables. Wear Clean/Comfortable clothing the morning of surgery Remember to brush your teeth WITH YOUR REGULAR TOOTHPASTE.   Please read over the following fact sheets that you were given.

## 2020-07-30 ENCOUNTER — Other Ambulatory Visit: Payer: Self-pay

## 2020-07-30 ENCOUNTER — Other Ambulatory Visit (HOSPITAL_COMMUNITY): Payer: Medicaid Other

## 2020-07-30 ENCOUNTER — Encounter (HOSPITAL_COMMUNITY)
Admission: RE | Admit: 2020-07-30 | Discharge: 2020-07-30 | Disposition: A | Discharge status: Home / Self Care | Payer: Medicaid Other | Source: Ambulatory Visit | Attending: Obstetrics & Gynecology | Admitting: Obstetrics & Gynecology

## 2020-07-30 ENCOUNTER — Encounter (HOSPITAL_COMMUNITY): Payer: Self-pay

## 2020-07-30 DIAGNOSIS — Z20822 Contact with and (suspected) exposure to covid-19: Secondary | ICD-10-CM | POA: Diagnosis not present

## 2020-07-30 DIAGNOSIS — Z01812 Encounter for preprocedural laboratory examination: Secondary | ICD-10-CM | POA: Insufficient documentation

## 2020-07-30 HISTORY — DX: Acute pancreatitis without necrosis or infection, unspecified: K85.90

## 2020-07-30 HISTORY — DX: Personal history of other diseases of the nervous system and sense organs: Z86.69

## 2020-07-30 LAB — BASIC METABOLIC PANEL
Anion gap: 8 (ref 5–15)
BUN: 14 mg/dL (ref 6–20)
CO2: 22 mmol/L (ref 22–32)
Calcium: 9.5 mg/dL (ref 8.9–10.3)
Chloride: 107 mmol/L (ref 98–111)
Creatinine, Ser: 0.92 mg/dL (ref 0.44–1.00)
GFR, Estimated: >60 mL/min (ref >60)
Glucose, Bld: 123 mg/dL — ABNORMAL HIGH (ref 70–99)
Potassium: 4 mmol/L (ref 3.5–5.1)
Sodium: 137 mmol/L (ref 135–145)

## 2020-07-30 LAB — TYPE AND SCREEN
ABO/RH(D): O POS
Antibody Screen: NEGATIVE

## 2020-07-30 LAB — CBC
HCT: 39.1 % (ref 36.0–46.0)
Hemoglobin: 12.8 g/dL (ref 12.0–15.0)
MCH: 28.2 pg (ref 26.0–34.0)
MCHC: 32.7 g/dL (ref 30.0–36.0)
MCV: 86.1 fL (ref 80.0–100.0)
Platelets: 272 10*3/uL (ref 150–400)
RBC: 4.54 MIL/uL (ref 3.87–5.11)
RDW: 16.6 % — ABNORMAL HIGH (ref 11.5–15.5)
WBC: 5.8 10*3/uL (ref 4.0–10.5)
nRBC: 0 % (ref 0.0–0.2)

## 2020-07-30 LAB — SURGICAL PCR SCREEN
MRSA, PCR: NEGATIVE
Staphylococcus aureus: NEGATIVE

## 2020-07-30 NOTE — Progress Notes (Signed)
PCP - Freddy Jaksch, FNP Cardiologist - denies  Chest x-ray - n/a EKG - 03/31/20 Stress Test - denies ECHO - denies Cardiac Cath - denies  Sleep Study - denies CPAP - denies  Blood Thinner Instructions: n/a Aspirin Instructions: n/a  ERAS Protcol -clear liquids until 1000 DOS.  PRE-SURGERY Ensure or G2- none ordered.   COVID TEST- 07/30/20 done in PAT  Anesthesia review: No  Patient denies shortness of breath, fever, cough and chest pain at PAT appointment   All instructions explained to the patient, with a verbal understanding of the material. Patient agrees to go over the instructions while at home for a better understanding. Patient also instructed to self quarantine after being tested for COVID-19. The opportunity to ask questions was provided.

## 2020-07-31 LAB — SARS CORONAVIRUS 2 (TAT 6-24 HRS): SARS Coronavirus 2: NEGATIVE

## 2020-08-01 ENCOUNTER — Encounter (HOSPITAL_COMMUNITY): Payer: Self-pay | Admitting: Obstetrics & Gynecology

## 2020-08-01 ENCOUNTER — Ambulatory Visit (HOSPITAL_COMMUNITY): Payer: Medicaid Other | Admitting: Anesthesiology

## 2020-08-01 ENCOUNTER — Encounter (HOSPITAL_COMMUNITY)
Admission: RE | Disposition: A | Discharge status: Home / Self Care | Payer: Self-pay | Source: Ambulatory Visit | Attending: Obstetrics & Gynecology

## 2020-08-01 ENCOUNTER — Ambulatory Visit (HOSPITAL_COMMUNITY)
Admission: RE | Admit: 2020-08-01 | Discharge: 2020-08-01 | Disposition: A | Discharge status: Home / Self Care | Payer: Medicaid Other | Source: Ambulatory Visit | Attending: Obstetrics & Gynecology | Admitting: Obstetrics & Gynecology

## 2020-08-01 DIAGNOSIS — N809 Endometriosis, unspecified: Secondary | ICD-10-CM | POA: Diagnosis present

## 2020-08-01 DIAGNOSIS — D270 Benign neoplasm of right ovary: Secondary | ICD-10-CM | POA: Insufficient documentation

## 2020-08-01 DIAGNOSIS — R102 Pelvic and perineal pain: Secondary | ICD-10-CM

## 2020-08-01 DIAGNOSIS — F17210 Nicotine dependence, cigarettes, uncomplicated: Secondary | ICD-10-CM | POA: Diagnosis not present

## 2020-08-01 DIAGNOSIS — N801 Endometriosis of ovary: Secondary | ICD-10-CM | POA: Diagnosis not present

## 2020-08-01 DIAGNOSIS — Z91048 Other nonmedicinal substance allergy status: Secondary | ICD-10-CM | POA: Insufficient documentation

## 2020-08-01 DIAGNOSIS — G8929 Other chronic pain: Secondary | ICD-10-CM | POA: Diagnosis not present

## 2020-08-01 DIAGNOSIS — N80129 Deep endometriosis of ovary, unspecified ovary: Secondary | ICD-10-CM | POA: Diagnosis present

## 2020-08-01 HISTORY — PX: CYSTOSCOPY: SHX5120

## 2020-08-01 HISTORY — PX: TOTAL LAPAROSCOPIC HYSTERECTOMY WITH BILATERAL SALPINGO OOPHORECTOMY: SHX6845

## 2020-08-01 LAB — HEMOGLOBIN: Hemoglobin: 13 g/dL (ref 12.0–15.0)

## 2020-08-01 LAB — POCT PREGNANCY, URINE: Preg Test, Ur: NEGATIVE

## 2020-08-01 SURGERY — HYSTERECTOMY, TOTAL, LAPAROSCOPIC, WITH BILATERAL SALPINGO-OOPHORECTOMY
Anesthesia: General

## 2020-08-01 MED ORDER — STERILE WATER FOR IRRIGATION IR SOLN
Status: DC | PRN
  Administered 2020-08-01: 1000 mL via INTRAVESICAL

## 2020-08-01 MED ORDER — BUPIVACAINE HCL (PF) 0.25 % IJ SOLN
INTRAMUSCULAR | Status: AC
  Filled 2020-08-01: qty 30

## 2020-08-01 MED ORDER — ENOXAPARIN SODIUM 40 MG/0.4ML IJ SOSY: 40.0000 mg | PREFILLED_SYRINGE | INTRAMUSCULAR | Status: DC

## 2020-08-01 MED ORDER — MENTHOL 3 MG MT LOZG: 1.0000 | LOZENGE | OROMUCOSAL | Status: DC | PRN

## 2020-08-01 MED ORDER — MORPHINE SULFATE (PF) 2 MG/ML IV SOLN
1.0000 mg | INTRAVENOUS | Status: DC | PRN
Start: 2020-08-01 — End: 2020-08-02

## 2020-08-01 MED ORDER — PHENYLEPHRINE 40 MCG/ML (10ML) SYRINGE FOR IV PUSH (FOR BLOOD PRESSURE SUPPORT)
PREFILLED_SYRINGE | INTRAVENOUS | Status: DC | PRN
Start: 2020-08-01 — End: 2020-08-01
  Administered 2020-08-01 (×2): 80 ug via INTRAVENOUS

## 2020-08-01 MED ORDER — SIMETHICONE 80 MG PO CHEW: 80.0000 mg | CHEWABLE_TABLET | Freq: Four times a day (QID) | ORAL | Status: DC | PRN

## 2020-08-01 MED ORDER — MIDAZOLAM HCL 2 MG/2ML IJ SOLN
INTRAMUSCULAR | Status: AC
  Filled 2020-08-01: qty 2

## 2020-08-01 MED ORDER — GABAPENTIN 100 MG PO CAPS
ORAL_CAPSULE | ORAL | Status: AC
  Administered 2020-08-01: 200 mg via ORAL
  Filled 2020-08-01: qty 2

## 2020-08-01 MED ORDER — FENTANYL CITRATE (PF) 250 MCG/5ML IJ SOLN
INTRAMUSCULAR | Status: AC
  Filled 2020-08-01: qty 5

## 2020-08-01 MED ORDER — CHLORHEXIDINE GLUCONATE 0.12 % MT SOLN
15.0000 mL | OROMUCOSAL | Status: AC
  Filled 2020-08-01: qty 15

## 2020-08-01 MED ORDER — ONDANSETRON HCL 4 MG/2ML IJ SOLN: 4.0000 mg | Freq: Four times a day (QID) | INTRAMUSCULAR | Status: DC | PRN

## 2020-08-01 MED ORDER — PROPOFOL 10 MG/ML IV BOLUS
INTRAVENOUS | Status: DC | PRN
  Administered 2020-08-01: 140 mg via INTRAVENOUS

## 2020-08-01 MED ORDER — ACETAMINOPHEN 500 MG PO TABS: 1000.0000 mg | ORAL_TABLET | Freq: Once | ORAL | Status: AC

## 2020-08-01 MED ORDER — ONDANSETRON HCL 4 MG PO TABS: 4.0000 mg | ORAL_TABLET | Freq: Four times a day (QID) | ORAL | Status: DC | PRN

## 2020-08-01 MED ORDER — OXYCODONE-ACETAMINOPHEN 5-325 MG PO TABS: 1.0000 | ORAL_TABLET | ORAL | 0 refills | Status: DC | PRN

## 2020-08-01 MED ORDER — MIDAZOLAM HCL 2 MG/2ML IJ SOLN
INTRAMUSCULAR | Status: DC | PRN
  Administered 2020-08-01: 2 mg via INTRAVENOUS

## 2020-08-01 MED ORDER — GABAPENTIN 100 MG PO CAPS: 100.0000 mg | ORAL_CAPSULE | Freq: Three times a day (TID) | ORAL | 0 refills | Status: DC

## 2020-08-01 MED ORDER — BUPIVACAINE HCL (PF) 0.25 % IJ SOLN
INTRAMUSCULAR | Status: DC | PRN
  Administered 2020-08-01: 7 mL

## 2020-08-01 MED ORDER — ENOXAPARIN SODIUM 40 MG/0.4ML IJ SOSY
PREFILLED_SYRINGE | INTRAMUSCULAR | Status: AC
  Administered 2020-08-01: 40 mg via SUBCUTANEOUS
  Filled 2020-08-01: qty 0.4

## 2020-08-01 MED ORDER — LIDOCAINE 2% (20 MG/ML) 5 ML SYRINGE
INTRAMUSCULAR | Status: DC | PRN
  Administered 2020-08-01: 60 mg via INTRAVENOUS

## 2020-08-01 MED ORDER — ALUM & MAG HYDROXIDE-SIMETH 200-200-20 MG/5ML PO SUSP: 30.0000 mL | ORAL | Status: DC | PRN

## 2020-08-01 MED ORDER — 0.9 % SODIUM CHLORIDE (POUR BTL) OPTIME
TOPICAL | Status: DC | PRN
  Administered 2020-08-01: 1000 mL

## 2020-08-01 MED ORDER — SODIUM CHLORIDE 0.9 % IV SOLN
2.0000 g | INTRAVENOUS | Status: AC
  Administered 2020-08-01: 2 g via INTRAVENOUS

## 2020-08-01 MED ORDER — FENTANYL CITRATE (PF) 100 MCG/2ML IJ SOLN
25.0000 ug | INTRAMUSCULAR | Status: DC | PRN
  Administered 2020-08-01: 25 ug via INTRAVENOUS

## 2020-08-01 MED ORDER — KETOROLAC TROMETHAMINE 30 MG/ML IJ SOLN
30.0000 mg | Freq: Four times a day (QID) | INTRAMUSCULAR | Status: DC
  Administered 2020-08-01: 30 mg via INTRAVENOUS
  Filled 2020-08-01: qty 1

## 2020-08-01 MED ORDER — ACETAMINOPHEN 500 MG PO TABS
ORAL_TABLET | ORAL | Status: AC
  Administered 2020-08-01: 1000 mg via ORAL
  Filled 2020-08-01: qty 2

## 2020-08-01 MED ORDER — IBUPROFEN 600 MG PO TABS: 600.0000 mg | ORAL_TABLET | Freq: Four times a day (QID) | ORAL | Status: DC

## 2020-08-01 MED ORDER — FENTANYL CITRATE (PF) 100 MCG/2ML IJ SOLN
INTRAMUSCULAR | Status: AC
  Filled 2020-08-01: qty 2

## 2020-08-01 MED ORDER — DEXTROSE-NACL 5-0.45 % IV SOLN: INTRAVENOUS | Status: DC

## 2020-08-01 MED ORDER — ROPIVACAINE HCL 5 MG/ML IJ SOLN
INTRAMUSCULAR | Status: AC
  Filled 2020-08-01: qty 30

## 2020-08-01 MED ORDER — POVIDONE-IODINE 10 % EX SWAB
2.0000 "application " | Freq: Once | CUTANEOUS | Status: AC
  Administered 2020-08-01: 2 via TOPICAL

## 2020-08-01 MED ORDER — SODIUM CHLORIDE 0.9 % IV SOLN
INTRAVENOUS | Status: AC
  Filled 2020-08-01: qty 2

## 2020-08-01 MED ORDER — CHLORHEXIDINE GLUCONATE 0.12 % MT SOLN
OROMUCOSAL | Status: AC
  Administered 2020-08-01: 15 mL via OROMUCOSAL
  Filled 2020-08-01: qty 15

## 2020-08-01 MED ORDER — OXYCODONE-ACETAMINOPHEN 5-325 MG PO TABS: 1.0000 | ORAL_TABLET | ORAL | Status: DC | PRN

## 2020-08-01 MED ORDER — ENOXAPARIN SODIUM 40 MG/0.4ML IJ SOSY: 40.0000 mg | PREFILLED_SYRINGE | INTRAMUSCULAR | Status: AC

## 2020-08-01 MED ORDER — PROMETHAZINE HCL 25 MG/ML IJ SOLN: 6.2500 mg | INTRAMUSCULAR | Status: DC | PRN

## 2020-08-01 MED ORDER — PANTOPRAZOLE SODIUM 40 MG IV SOLR: 40.0000 mg | Freq: Every day | INTRAVENOUS | Status: DC

## 2020-08-01 MED ORDER — ROCURONIUM BROMIDE 10 MG/ML (PF) SYRINGE
PREFILLED_SYRINGE | INTRAVENOUS | Status: DC | PRN
  Administered 2020-08-01: 20 mg via INTRAVENOUS
  Administered 2020-08-01: 60 mg via INTRAVENOUS

## 2020-08-01 MED ORDER — LACTATED RINGERS IV SOLN: INTRAVENOUS | Status: DC

## 2020-08-01 MED ORDER — ONDANSETRON HCL 4 MG/2ML IJ SOLN
INTRAMUSCULAR | Status: DC | PRN
  Administered 2020-08-01: 4 mg via INTRAVENOUS

## 2020-08-01 MED ORDER — GABAPENTIN 100 MG PO CAPS
100.0000 mg | ORAL_CAPSULE | Freq: Three times a day (TID) | ORAL | Status: DC
  Administered 2020-08-01: 100 mg via ORAL
  Filled 2020-08-01: qty 1

## 2020-08-01 MED ORDER — SUGAMMADEX SODIUM 200 MG/2ML IV SOLN
INTRAVENOUS | Status: DC | PRN
  Administered 2020-08-01: 150 mg via INTRAVENOUS

## 2020-08-01 MED ORDER — SODIUM CHLORIDE 0.9 % IV SOLN
INTRAVENOUS | Status: DC | PRN
  Administered 2020-08-01: 120 mL

## 2020-08-01 MED ORDER — HEMOSTATIC AGENTS (NO CHARGE) OPTIME
TOPICAL | Status: DC | PRN
  Administered 2020-08-01: 1

## 2020-08-01 MED ORDER — GABAPENTIN 100 MG PO CAPS: 200.0000 mg | ORAL_CAPSULE | ORAL | Status: AC

## 2020-08-01 MED ORDER — FENTANYL CITRATE (PF) 250 MCG/5ML IJ SOLN
INTRAMUSCULAR | Status: DC | PRN
  Administered 2020-08-01: 50 ug via INTRAVENOUS
  Administered 2020-08-01: 100 ug via INTRAVENOUS
  Administered 2020-08-01: 50 ug via INTRAVENOUS

## 2020-08-01 MED ORDER — DEXAMETHASONE SODIUM PHOSPHATE 10 MG/ML IJ SOLN
INTRAMUSCULAR | Status: DC | PRN
  Administered 2020-08-01: 8 mg via INTRAVENOUS

## 2020-08-01 SURGICAL SUPPLY — 77 items
APPLICATOR ARISTA FLEXITIP XL (MISCELLANEOUS) ×4 IMPLANT
BAG COUNTER SPONGE SURGICOUNT (BAG) ×3 IMPLANT
BAG SURGICOUNT SPONGE COUNTING (BAG) ×1
BENZOIN TINCTURE PRP APPL 2/3 (GAUZE/BANDAGES/DRESSINGS) IMPLANT
CABLE HIGH FREQUENCY MONO STRZ (ELECTRODE) IMPLANT
CANISTER SUCT 3000ML PPV (MISCELLANEOUS) ×4 IMPLANT
CLOSURE WOUND 1/2 X4 (GAUZE/BANDAGES/DRESSINGS)
COVER MAYO STAND STRL (DRAPES) ×4 IMPLANT
COVER SURGICAL LIGHT HANDLE (MISCELLANEOUS) ×4 IMPLANT
DECANTER SPIKE VIAL GLASS SM (MISCELLANEOUS) ×4 IMPLANT
DERMABOND ADVANCED (GAUZE/BANDAGES/DRESSINGS) ×2
DERMABOND ADVANCED .7 DNX12 (GAUZE/BANDAGES/DRESSINGS) ×2 IMPLANT
DRAPE WARM FLUID 44X44 (DRAPES) IMPLANT
DRSG OPSITE POSTOP 4X10 (GAUZE/BANDAGES/DRESSINGS) ×4 IMPLANT
DURAPREP 26ML APPLICATOR (WOUND CARE) ×4 IMPLANT
GAUZE 4X4 16PLY ~~LOC~~+RFID DBL (SPONGE) ×4 IMPLANT
GLOVE SURG LTX SZ6.5 (GLOVE) ×8 IMPLANT
GLOVE SURG UNDER POLY LF SZ7 (GLOVE) ×16 IMPLANT
GOWN STRL REUS W/ TWL LRG LVL3 (GOWN DISPOSABLE) ×8 IMPLANT
GOWN STRL REUS W/ TWL XL LVL3 (GOWN DISPOSABLE) ×2 IMPLANT
GOWN STRL REUS W/TWL LRG LVL3 (GOWN DISPOSABLE) ×16
GOWN STRL REUS W/TWL XL LVL3 (GOWN DISPOSABLE) ×4
HEMOSTAT ARISTA ABSORB 3G PWDR (HEMOSTASIS) ×4 IMPLANT
HIBICLENS CHG 4% 4OZ BTL (MISCELLANEOUS) ×4 IMPLANT
IRRIG SUCT STRYKERFLOW 2 WTIP (MISCELLANEOUS) ×4
IRRIGATION SUCT STRKRFLW 2 WTP (MISCELLANEOUS) ×2 IMPLANT
KIT TURNOVER KIT B (KITS) ×4 IMPLANT
LIGASURE VESSEL 5MM BLUNT TIP (ELECTROSURGICAL) ×4 IMPLANT
NEEDLE INSUFFLATION 14GA 120MM (NEEDLE) ×4 IMPLANT
NS IRRIG 1000ML POUR BTL (IV SOLUTION) ×4 IMPLANT
OCCLUDER COLPOPNEUMO (BALLOONS) ×4 IMPLANT
PACK ABDOMINAL GYN (CUSTOM PROCEDURE TRAY) ×4 IMPLANT
PACK LAPAROSCOPY BASIN (CUSTOM PROCEDURE TRAY) ×4 IMPLANT
PACK TRENDGUARD 450 HYBRID PRO (MISCELLANEOUS) ×2 IMPLANT
PAD ARMBOARD 7.5X6 YLW CONV (MISCELLANEOUS) ×4 IMPLANT
PAD OB MATERNITY 4.3X12.25 (PERSONAL CARE ITEMS) ×4 IMPLANT
POUCH LAPAROSCOPIC INSTRUMENT (MISCELLANEOUS) ×4 IMPLANT
POUCH SPECIMEN RETRIEVAL 10MM (ENDOMECHANICALS) ×4 IMPLANT
PROTECTOR NERVE ULNAR (MISCELLANEOUS) ×8 IMPLANT
RTRCTR C-SECT PINK 25CM LRG (MISCELLANEOUS) IMPLANT
SCISSORS LAP 5X35 DISP (ENDOMECHANICALS) ×4 IMPLANT
SET CYSTO W/LG BORE CLAMP LF (SET/KITS/TRAYS/PACK) ×4 IMPLANT
SET IRRIG TUBING LAPAROSCOPIC (IRRIGATION / IRRIGATOR) ×4 IMPLANT
SET TRI-LUMEN FLTR TB AIRSEAL (TUBING) ×4 IMPLANT
SET TUBE SMOKE EVAC HIGH FLOW (TUBING) ×4 IMPLANT
SHEARS HARMONIC ACE PLUS 36CM (ENDOMECHANICALS) ×4 IMPLANT
SHEET LAVH (DRAPES) ×4 IMPLANT
SPONGE SURGIFOAM ABS GEL 12-7 (HEMOSTASIS) IMPLANT
SPONGE T-LAP 18X18 ~~LOC~~+RFID (SPONGE) IMPLANT
STRIP CLOSURE SKIN 1/2X4 (GAUZE/BANDAGES/DRESSINGS) IMPLANT
SUT VIC AB 0 CT1 18XCR BRD8 (SUTURE) ×6 IMPLANT
SUT VIC AB 0 CT1 27 (SUTURE) ×8
SUT VIC AB 0 CT1 27XBRD ANBCTR (SUTURE) ×4 IMPLANT
SUT VIC AB 0 CT1 8-18 (SUTURE) ×12
SUT VIC AB 2-0 CT1 27 (SUTURE) ×4
SUT VIC AB 2-0 CT1 TAPERPNT 27 (SUTURE) ×2 IMPLANT
SUT VIC AB 3-0 PS2 18 (SUTURE) ×4
SUT VIC AB 3-0 PS2 18XBRD (SUTURE) ×2 IMPLANT
SUT VICRYL 0 TIES 12 18 (SUTURE) ×4 IMPLANT
SUT VICRYL 4-0 PS2 18IN ABS (SUTURE) ×8 IMPLANT
SUT VLOC 180 0 9IN  GS21 (SUTURE) ×4
SUT VLOC 180 0 9IN GS21 (SUTURE) ×2 IMPLANT
SYR 10ML LL (SYRINGE) ×4 IMPLANT
SYR 50ML LL SCALE MARK (SYRINGE) ×8 IMPLANT
TIP UTERINE 5.1X6CM LAV DISP (MISCELLANEOUS) IMPLANT
TIP UTERINE 6.7X10CM GRN DISP (MISCELLANEOUS) IMPLANT
TIP UTERINE 6.7X6CM WHT DISP (MISCELLANEOUS) IMPLANT
TIP UTERINE 6.7X8CM BLUE DISP (MISCELLANEOUS) ×4 IMPLANT
TOWEL GREEN STERILE FF (TOWEL DISPOSABLE) ×8 IMPLANT
TRAY FOLEY W/BAG SLVR 14FR (SET/KITS/TRAYS/PACK) ×4 IMPLANT
TRENDGUARD 450 HYBRID PRO PACK (MISCELLANEOUS) ×4
TROCAR ADV FIXATION 5X100MM (TROCAR) ×4 IMPLANT
TROCAR PORT AIRSEAL 5X120 (TROCAR) ×4 IMPLANT
TROCAR XCEL NON BLADE 8MM B8LT (ENDOMECHANICALS) ×4 IMPLANT
TROCAR XCEL NON-BLD 5MMX100MML (ENDOMECHANICALS) ×4 IMPLANT
UNDERPAD 30X36 HEAVY ABSORB (UNDERPADS AND DIAPERS) ×4 IMPLANT
WARMER LAPAROSCOPE (MISCELLANEOUS) ×4 IMPLANT

## 2020-08-01 NOTE — Discharge Instructions (Signed)
Post Op Hysterectomy Instructions Please read the instructions below. Refer to these instructions for the next few weeks. These instructions provide you with general information on caring for yourself after surgery. Your caregiver may also give you specific instructions. While your treatment has been planned according to the most current medical practices available, unavoidable problems sometimes happen. If you have any problems or questions after you leave, please call your caregiver.  HOME CARE INSTRUCTIONS Healing will take time. You will have discomfort, tenderness, swelling and bruising at the operative site for a couple of weeks. This is normal and will get better as time goes on.  Only take over-the-counter or prescription medicines for pain, discomfort or fever as directed by your caregiver.  Do not take aspirin. It can cause bleeding.  Do not drive when taking narcotic pain medication (like the percocet). Follow your caregiver's advice regarding diet, exercise, lifting, driving and general activities.  Resume your usual diet as directed and allowed.  Get plenty of rest and sleep.  Do not douche, use tampons, or have sexual intercourse until your caregiver gives you permission. .  Take your temperature if you feel hot or flushed.  You may shower today when you get home.  No tub bath for one week.   Do not drink alcohol until you are not taking any narcotic pain medications.  Try to have someone home with you for a week or two to help with the household activities.   Be careful over the next two to three weeks with any activities at home that involve lifting, pushing, or pulling.  Listen to your body--if something feels uncomfortable to do, then don't do it. Make sure you and your family understands everything about your operation and recovery.  Walking up stairs is fine. Do not sign any legal documents until you feel normal again.  Keep all your follow-up appointments as recommended by  your caregiver.   PLEASE CALL THE OFFICE IF: There is swelling, redness or increasing pain in the wound area.  Pus is coming from the wound.  You notice a bad smell from the wound or surgical dressing.  You have pain, redness and swelling from the intravenous site.  The wound is breaking open (the edges are not staying together).   You develop pain or bleeding when you urinate.  You develop abnormal vaginal discharge.  You have any type of abnormal reaction or develop an allergy to your medication.  You need stronger pain medication for your pain   SEEK IMMEDIATE MEDICAL CARE: You develop a temperature of 100.5 or higher.  You develop abdominal pain.  You develop chest pain.  You develop shortness of breath.  You pass out.  You develop pain, swelling or redness of your leg.  You develop heavy vaginal bleeding with or without blood clots.   MEDICATIONS: Restart your regular medications BUT wait one week before restarting all vitamins and mineral supplements Use Tylenol 1 gram (2- 500mg  extra strength Tylenol) every 6 hours for the next several days.  This will help you use less Percocet.  Use the Percocet 5/325 1-2 tabs every 4-6 hours as needed for pain.  If you take the Percocet, skip a tylenol dose as the percocet also contains tylenol.  The maximum tylenol you should take in 24 hours is 4 grams (including tylenol alone or the tylenol in Percocet). You may use an over the counter stool softener like Colace or Dulcolax to help with starting a bowel movement.  Start the  day after you go home.  Warm liquids, fluids, and ambulation help too.  If you have not had a bowel movement in four days, you need to call the office.

## 2020-08-01 NOTE — Transfer of Care (Signed)
Immediate Anesthesia Transfer of Care Note  Patient: Mary Ramos  Procedure(s) Performed: TOTAL LAPAROSCOPIC HYSTERECTOMY WITH BILATERAL SALPINGO OOPHORECTOMY (Bilateral) CYSTOSCOPY  Patient Location: PACU  Anesthesia Type:General  Level of Consciousness: awake, alert , oriented and patient cooperative  Airway & Oxygen Therapy: Patient Spontanous Breathing  Post-op Assessment: Report given to RN and Post -op Vital signs reviewed and stable  Post vital signs: Reviewed and stable  Last Vitals:  Vitals Value Taken Time  BP 152/80 08/01/20 1557  Temp    Pulse 68 08/01/20 1558  Resp 13 08/01/20 1558  SpO2 98 % 08/01/20 1558  Vitals shown include unvalidated device data.  Last Pain:  Vitals:   08/01/20 1117  TempSrc:   PainSc: 4       Patients Stated Pain Goal: 0 (07/68/08 8110)  Complications: No notable events documented.

## 2020-08-01 NOTE — H&P (Signed)
Mary Ramos is an 50 y.o. female G74P2 WAAF here for hopeful definitive treatment of her endometrioma that measures 6 x 4.3 x 7.2cm on the right.  She has been experiencing pain for years and this endometrioma was seen on imaging in 2020.  She was advised then to just monitor.  Pain has continued.  Cycle bleeding is under good control as she is on depo provera but pain is much more intense when she has bleeding.  Pt did have pancreatitis in 03/31/2019 from unclear etiology.  She did have a pseudocyst that resolved with conservative management.  She has been cleared from GI standpoint to proceed with surgery.    Due to increasing size and pain, I do not feel she has any alternative but to proceed with definitive surgery.  Risks and benefits have been discussed and were reviewed today.    Pertinent Gynecological History: Menses:  irregular Bleeding: irregular due to depo provera Contraception: Depo-Provera injections DES exposure: denies Blood transfusions: none Sexually transmitted diseases: past history: gonorrhea (years ago) Previous GYN Procedures:  none   Last mammogram: normal Date: 03/30/2019 Last pap: normal Date: 3/22/20222, neg and with neg HR HPV OB History: G3, P2   Menstrual History: No LMP recorded. Patient has had an injection.    Past Medical History:  Diagnosis Date   History of seizures as a child    father dropped pt as a baby. Hasnt had a seizure in over 30 years.   Hypertension    Pancreatitis     Past Surgical History:  Procedure Laterality Date   HERNIA REPAIR  1975   LEEP     years ago   NECK SURGERY  2017   with right shoulder surgery    Family History  Problem Relation Age of Onset   Lupus Mother    Pancreatic cancer Father     Social History:  reports that she has been smoking cigarettes. She has been smoking an average of 0.25 packs per day. She has never used smokeless tobacco. She reports current drug use. Drug: Marijuana. She reports that she  does not drink alcohol.  Allergies:  Allergies  Allergen Reactions   Carbomer Hives    Nickel     Medications Prior to Admission  Medication Sig Dispense Refill Last Dose   famotidine (PEPCID) 20 MG tablet Take 20 mg by mouth daily as needed for heartburn or indigestion.   07/31/2020   ketorolac (TORADOL) 10 MG tablet Take 1 tablet (10 mg total) by mouth every 6 (six) hours as needed (pain). 60 tablet 0 07/31/2020   metroNIDAZOLE (FLAGYL) 500 MG tablet Take 1 tablet (500 mg total) by mouth 2 (two) times daily. 14 tablet 0 07/31/2020   ondansetron (ZOFRAN) 4 MG tablet Take 1 tablet (4 mg total) by mouth every 6 (six) hours as needed for nausea. 20 tablet 0 07/31/2020   vitamin B-12 (CYANOCOBALAMIN) 250 MCG tablet Take 250 mcg by mouth daily.   07/31/2020   acetaminophen (TYLENOL) 325 MG tablet Take 2 tablets (650 mg total) by mouth every 6 (six) hours as needed for mild pain (or Fever >/= 101). (Patient not taking: No sig reported) 20 tablet 0 Not Taking   acetaminophen (TYLENOL) 500 MG tablet Take 1 tablet (500 mg total) by mouth every 6 (six) hours as needed (pain). (Patient not taking: No sig reported) 60 tablet 0 Not Taking   HYDROcodone-acetaminophen (NORCO/VICODIN) 5-325 MG tablet Take 1 tablet by mouth every 6 (six) hours as needed. (  Patient not taking: Reported on 07/20/2020) 10 tablet 0 Not Taking    Review of Systems  All other systems reviewed and are negative.  Blood pressure 134/84, pulse 65, temperature 97.8 F (36.6 C), temperature source Oral, resp. rate 17, height 5\' 11"  (1.803 m), weight 67.1 kg, SpO2 99 %. Physical Exam Constitutional:      Appearance: Normal appearance.  Cardiovascular:     Rate and Rhythm: Normal rate and regular rhythm.     Pulses: Normal pulses.     Heart sounds: Normal heart sounds.  Pulmonary:     Effort: Pulmonary effort is normal.     Breath sounds: Normal breath sounds.  Neurological:     General: No focal deficit present.     Mental Status:  She is alert and oriented to person, place, and time.  Psychiatric:        Mood and Affect: Mood normal.    Results for orders placed or performed during the hospital encounter of 08/01/20 (from the past 24 hour(s))  Pregnancy, urine POC     Status: None   Collection Time: 08/01/20 11:09 AM  Result Value Ref Range   Preg Test, Ur NEGATIVE NEGATIVE    No results found.  Assessment/Plan: 50 yo G3P2 SAA female here for TLH/RSO, possible LSO, cystoscopy, possible TAH due to 7cm endometrioma and chronic pelvic pain.  Procedure, risks, benefits and alternatives reviewed.  Pt here and ready to proceed.  Megan Salon 08/01/2020, 12:29 PM

## 2020-08-01 NOTE — Progress Notes (Signed)
Patient ID: Mary Ramos, female   DOB: May 27, 1970, 50 y.o.   MRN: [id number]  Pt's nurse has communicated with me that pt would like to go home.  Has eaten, walked x 2, voided and has good pain control.  Have spoken on phone to pt.  Denies vaginal bleeding.  She would like to go home so post op orders will be done.  Reminded pt about post op instructions which will be written down for her with paper work.  Will use tylenol or percocet for post op pain and gabapentin.

## 2020-08-01 NOTE — Progress Notes (Signed)
Pt has urinated x2; walked 2x around the unit; eaten regular food and drank; hgb pending. Notified MD.

## 2020-08-01 NOTE — Anesthesia Procedure Notes (Signed)
Procedure Name: Intubation Date/Time: 08/01/2020 1:42 PM Performed by: Asher Muir, CRNA Pre-anesthesia Checklist: Patient identified, Emergency Drugs available, Suction available and Patient being monitored Patient Re-evaluated:Patient Re-evaluated prior to induction Oxygen Delivery Method: Circle system utilized and Simple face mask Preoxygenation: Pre-oxygenation with 100% oxygen Induction Type: IV induction Ventilation: Mask ventilation without difficulty Laryngoscope Size: 3 and Glidescope (Lopro) Grade View: Grade II Tube type: Oral Tube size: 7.0 mm Number of attempts: 1 Airway Equipment and Method: Stylet and Video-laryngoscopy Placement Confirmation: ETT inserted through vocal cords under direct vision, positive ETCO2 and breath sounds checked- equal and bilateral Secured at: 21 (right lip) cm Tube secured with: Tape Dental Injury: Teeth and Oropharynx as per pre-operative assessment

## 2020-08-01 NOTE — Op Note (Signed)
08/01/2020  3:54 PM  PATIENT:  Mary Ramos  50 y.o. female  PRE-OPERATIVE DIAGNOSIS:  7 cm endometrioma, chronic pelvic pain  POST-OPERATIVE DIAGNOSIS:  7 cm endometrioma, chronic pelvic pain  PROCEDURE:  Procedure(s): TOTAL LAPAROSCOPIC HYSTERECTOMY WITH BILATERAL SALPINGO OOPHORECTOMY CYSTOSCOPY  SURGEON:  Megan Salon  ASSISTANTS: Verita Schneiders.  An experienced assistant was required given the standard of surgical care given the complexity of the case.  This assistant was needed for exposure, dissection, suctioning, retraction, instrument exchange and for overall help during the procedure.  RNFA help was also unavailable.  ANESTHESIA:   general  ESTIMATED BLOOD LOSS: 25 mL  BLOOD ADMINISTERED:none   FLUIDS: 1000cc L$  UOP: 150cc concentrated urine  SPECIMEN:  uterus, right and left fallopian tube, right ovary  DISPOSITION OF SPECIMEN:  PATHOLOGY  FINDINGS: enlarged right ovary, almost 10cm, no evidence of pelvic endometriosis, left ovary was normal and without adhesions  DESCRIPTION OF OPERATION: Patient is taken to the operating room. She is placed in the supine position. She is a running IV in place. Informed consent was present on the chart. SCDs on her lower extremities and functioning properly. Patient was positioned while she was awake.  Her legs were placed in the low lithotomy position in [place]. Her arms were tucked by the side.  General endotracheal anesthesia was administered by the anesthesia staff without difficulty. Dr. Gifford Shave, anesthesia, oversaw case.  Time out performed.    Dura prep was then used to prep the abdomen and Hibiclens was used to prep the inner thighs, perineum and vagina. Once 3 minutes had past the patient was draped in a normal standard fashion. The legs were lifted to the high lithotomy position. The cervix was visualized by placing a heavy weighted speculum in the posterior aspect of the vagina and using a curved Deaver retractor to  the retract anteriorly. The anterior lip of the cervix was grasped with single-tooth tenaculum.  The cervix sounded to 9 cm. Pratt dilators were used to dilate the cervix up to a #21. A RUMI uterine manipulator was obtained. A #8 disposable tip was placed on the RUMI manipulator as well as a 3.0, silver KOH ring. This was passed through the cervix and the bulb of the disposable tip was inflated with 10 cc of normal saline. There was a good fit of the KOH ring around the cervix. The tenaculum was removed. There is also good manipulation of the uterus. The speculum and retractor were removed as well. A Foley catheter was placed to straight drain.  Clear urine was noted. Legs were lowered to the low lithotomy position and attention was turned the abdomen.  Pt had umbilical scar from prior surgery that is not completely known what was done so decision made to place LUQ entry. OG tube placed by anesthesia first.  Then 2cm below costal margin and in mid clavicular line, a 61mm skin incision was made.  Using direct entry, a 69mm non bladed trochar and port were placed under direct visualization.  Once intraperitoneal placement was confirmed, pneumoperitoneum was achieved with CO2 gas.  Findings included significantly enlarged right ovary (with prior MRI describing this as an endometrioma.  It was not adhesed.  Locations for RLQ, LLQ, and suprapubic ports were noted by transillumination of the abdominal wall.  0.25% marcaine was used to anesthetize the skin.  90mm skin incision was made in the RLQ and an AirSeal port was placed underdirect visualization of the laparoscope.  Then a 63mm skin  incision was made and a 57mm nonbladed trochar and port was placed in the LLQ.  Finally, and 46mm skin incision was made about 4cm above the pubic symphasis and an 32mm non-bladed port was placed with direct visualization of the laparoscope.  All trochars were removed.    Ureters were identified.  Attention was turned to the left side. With  uterus on stretch the left tube was excised off the ovary and mesosalpinx was dissected to free the tube. Then the left utero-ovarian pedicle was serially clamped cauterized and incised using the ligasure device. Left round ligament was serially clamped cauterized and incised. The anterior and posterior peritoneum of the inferior leaf of the broad ligament were opened. The beginning of the bladder flap was created.  The bladder was taken down below the level of the KOH ring. The left uterine artery skeletonized and then just superior to the KOH ring this vessel was serially clamped, cauterized, and incised.  Attention was turned the right side.  The uterus was placed on stretch to the opposite side.  The ureter was identified.  The right IP ligament was serially clamped, cauterized and incised.  Due to the size of the ovary and difficulty visualizing anything in the pelvis, the right uteroovarian pedicle was serially clamped, cauterize and incised and the ovary and tube were freed.  These were placed in the upper abdomen to improve visualization.  Next the right round ligament was serially clamped cauterized and incised. The anterior posterior peritoneum of the inferiorly for the broad ligament were opened. The anterior peritoneum was carried across to the dissection on the left side. The remainder of the bladder flap was created using sharp dissection. The bladder was well below the level of the KOH ring. The left uterine artery skeletonized. Then the left uterine artery, above the level of the KOH ring, was serially clamped cauterized and incised. The uterus was devascularized at this point.  The colpotomy was performed a starting in the midline and using a harmonic scalpel with the inferior edge of the open blade  This was carried around a circumferential fashion until the vaginal mucosa was completely incised in the specimen was freed.  The specimen was then delivered to the vagina.  A vaginal occlusive  device was used to maintain the pneumoperitoneum  Instruments were changed with a needle driver and Kobra graspers.  Using a 9 inch V. lock suture, the cuff was closed by incorporating the anterior and posterior vaginal mucosa in each stitch. This was carried across all the way to the left corner and a running fashion. Two stitches were brought back towards the midline and the suture was cut flush with the vagina. The needle was brought out the pelvis. The pelvis was irrigated. All pedicles were inspected. No bleeding was noted. CO 2 gas was decreased to 5mm Hg and all pedicles were watched.  No bleeding was noted.  Arista was then placed along the cuff and pedicles.    At this point the procedure was completed.  The remaining instruments were removed.  The ports were removed under direct visualization of the laparoscope and the pneumoperitoneum was relieved.  The patient was taken out of Trendelenburg positioning.  Several deep breaths were given to the patient's trying to any gas the abdomen and finally the suprapubic port was removed.  The skin was then closed with subcuticular stitches of 3-0 Vicryl. The skin was cleansed Dermabond was applied. Attention was then turned the vagina and the cuff was inspected.  No bleeding was noted. The anterior posterior vaginal mucosa was incorporated in each stitch. The Foley catheter was removed.  Cystoscopy was performed.  No sutures or bladder injuries were noted.  Normal bubble was seen on the bladder dome.  Ureters were noted with normal urine jets from each one was seen.  Foley was left out after the cystoscopic fluid was drained and cystoscope removed.  Sponge, lap, needle, initially counts were correct x2. Patient tolerated the procedure very well. She was awakened from anesthesia, extubated and taken to recovery in stable condition.   COUNTS:  YES  PLAN OF CARE: Transfer to PACU

## 2020-08-01 NOTE — Anesthesia Preprocedure Evaluation (Addendum)
Anesthesia Evaluation  Patient identified by MRN, date of birth, ID band Patient awake    Reviewed: Allergy & Precautions, NPO status , Patient's Chart, lab work & pertinent test results  Airway Mallampati: II  TM Distance: >3 FB Neck ROM: Full    Dental  (+) Teeth Intact, Dental Advisory Given, Chipped,    Pulmonary Patient abstained from smoking., former smoker,    Pulmonary exam normal breath sounds clear to auscultation       Cardiovascular hypertension, Normal cardiovascular exam Rhythm:Regular Rate:Normal     Neuro/Psych Seizures -, Well Controlled,  PSYCHIATRIC DISORDERS Depression    GI/Hepatic Neg liver ROS, GERD  Medicated,  Endo/Other  negative endocrine ROS  Renal/GU negative Renal ROS     Musculoskeletal  (+) Arthritis ,   Abdominal   Peds  Hematology negative hematology ROS (+)   Anesthesia Other Findings Day of surgery medications reviewed with the patient.  Reproductive/Obstetrics 7 cm endometrioma                            Anesthesia Physical Anesthesia Plan  ASA: 2  Anesthesia Plan: General   Post-op Pain Management:    Induction: Intravenous  PONV Risk Score and Plan: 3 and Midazolam, Dexamethasone and Ondansetron  Airway Management Planned: Oral ETT  Additional Equipment:   Intra-op Plan:   Post-operative Plan: Extubation in OR  Informed Consent: I have reviewed the patients History and Physical, chart, labs and discussed the procedure including the risks, benefits and alternatives for the proposed anesthesia with the patient or authorized representative who has indicated his/her understanding and acceptance.     Dental advisory given  Plan Discussed with: CRNA  Anesthesia Plan Comments:         Anesthesia Quick Evaluation

## 2020-08-01 NOTE — Progress Notes (Signed)
[name] to be D/C'd  per MD order.  Discussed with the patient and all questions fully answered.  VSS, Skin clean, dry and intact without evidence of skin break down, no evidence of skin tears noted.  IV catheter discontinued intact. Site without signs and symptoms of complications. Dressing and pressure applied.  An After Visit Summary was printed and given to the patient.  D/c education completed with patient/family including follow up instructions, medication list, d/c activities limitations if indicated, with other d/c instructions as indicated by MD - patient able to verbalize understanding, all questions fully answered.   Patient instructed to return to ED, call 911, or call MD for any changes in condition.   Patient to be escorted via [place], and D/C home via private auto.

## 2020-08-02 ENCOUNTER — Encounter (HOSPITAL_COMMUNITY): Payer: Self-pay | Admitting: Obstetrics & Gynecology

## 2020-08-02 NOTE — Anesthesia Postprocedure Evaluation (Signed)
Anesthesia Post Note  Patient: Mary Ramos  Procedure(s) Performed: TOTAL LAPAROSCOPIC HYSTERECTOMY WITH BILATERAL SALPINGO OOPHORECTOMY (Bilateral) CYSTOSCOPY     Patient location during evaluation: PACU Anesthesia Type: General Level of consciousness: awake and alert Pain management: pain level controlled Vital Signs Assessment: post-procedure vital signs reviewed and stable Respiratory status: spontaneous breathing, nonlabored ventilation, respiratory function stable and patient connected to nasal cannula oxygen Cardiovascular status: blood pressure returned to baseline, stable and bradycardic Postop Assessment: no apparent nausea or vomiting Anesthetic complications: no   No notable events documented.  Last Vitals:  Vitals:   08/01/20 1645 08/01/20 1700  BP: (!) 154/83 140/86  Pulse: (!) 53 (!) 55  Resp: 12 16  Temp: 36.7 C 36.7 C  SpO2: 100% 99%    Last Pain:  Vitals:   08/01/20 1700  TempSrc: Oral  PainSc:                  [name]

## 2020-08-06 LAB — SURGICAL PATHOLOGY

## 2020-08-08 ENCOUNTER — Other Ambulatory Visit (HOSPITAL_BASED_OUTPATIENT_CLINIC_OR_DEPARTMENT_OTHER): Payer: Self-pay | Admitting: Obstetrics & Gynecology

## 2020-08-09 ENCOUNTER — Ambulatory Visit (INDEPENDENT_AMBULATORY_CARE_PROVIDER_SITE_OTHER): Payer: Medicaid Other | Admitting: Obstetrics & Gynecology

## 2020-08-09 ENCOUNTER — Other Ambulatory Visit: Payer: Self-pay

## 2020-08-09 ENCOUNTER — Encounter (HOSPITAL_BASED_OUTPATIENT_CLINIC_OR_DEPARTMENT_OTHER): Payer: Self-pay | Admitting: Obstetrics & Gynecology

## 2020-08-09 VITALS — BP 143/91 | HR 61 | Wt 147.8 lb

## 2020-08-09 DIAGNOSIS — Z9889 Other specified postprocedural states: Secondary | ICD-10-CM

## 2020-08-09 DIAGNOSIS — D279 Benign neoplasm of unspecified ovary: Secondary | ICD-10-CM | POA: Diagnosis not present

## 2020-08-09 NOTE — Progress Notes (Signed)
GYNECOLOGY  VISIT  CC:   post op recheck  HPI: 50 y.o. [id number] Single Black or African American female here for recheck after undergoing TLH/RSO/left salpingectomy/cystoscopy on 08/01/2020.  She reports bleeding is minimal.  She has minimal pain.  Bowel function is Normal.  Bladder function is normal.    Pathology reviewed:  Yes .  Questions answered.    MEDS:   Current Outpatient Medications on File Prior to Visit  Medication Sig Dispense Refill   acetaminophen (TYLENOL) 500 MG tablet Take 1 tablet (500 mg total) by mouth every 6 (six) hours as needed (pain). 60 tablet 0   famotidine (PEPCID) 20 MG tablet Take 20 mg by mouth daily as needed for heartburn or indigestion.     gabapentin (NEURONTIN) 100 MG capsule Take 1 capsule (100 mg total) by mouth 3 (three) times daily. 30 capsule 0   oxyCODONE-acetaminophen (PERCOCET/ROXICET) 5-325 MG tablet Take 1-2 tablets by mouth every 4 (four) hours as needed for moderate pain. 30 tablet 0   No current facility-administered medications on file prior to visit.    SH:  Smoking No    PHYSICAL EXAMINATION:    BP (!) 143/91 (BP Location: Right Arm, Patient Position: Sitting, Cuff Size: Small)   Pulse 61   Wt 147 lb 12.8 oz (67 kg)   BMI 20.61 kg/m     General appearance: alert, cooperative and appears stated age CV:  Regular rate and rhythm Lungs:  clear to auscultation, no wheezes, rales or rhonchi, symmetric air entry Abdomen: soft, non-tender; bowel sounds normal; no masses,  no organomegaly Incisions:  C/D/I  Pelvic: deferred  Assessment/Plan:

## 2020-08-10 LAB — T4, FREE: Free T4: 0.97 ng/dL (ref 0.82–1.77)

## 2020-08-10 LAB — TSH: TSH: 3.36 u[IU]/mL (ref 0.450–4.500)

## 2020-08-15 ENCOUNTER — Encounter (HOSPITAL_BASED_OUTPATIENT_CLINIC_OR_DEPARTMENT_OTHER): Payer: Self-pay

## 2020-08-15 ENCOUNTER — Telehealth (HOSPITAL_BASED_OUTPATIENT_CLINIC_OR_DEPARTMENT_OTHER): Payer: Self-pay | Admitting: Obstetrics & Gynecology

## 2020-08-15 NOTE — Telephone Encounter (Signed)
Patient called and left a message stating that she is having some spotting and itching down there and to please let Dr.Miller know.

## 2020-08-15 NOTE — Telephone Encounter (Signed)
Pt states that she noticed some light pink spotting when she wiped. Advised that some spotting can be normal especially if her activity level has increased. Pt states that she has been doing more. She states that she noticed some itching when wiping as well but not at other times. She is currently taking an antibiotic. Advised that if the bleeding increases, becomes brighter in color, or if the itching increases or is accompanied by discharge to call us back and let us know. Pt verbalized understanding.

## 2020-08-16 ENCOUNTER — Other Ambulatory Visit (HOSPITAL_BASED_OUTPATIENT_CLINIC_OR_DEPARTMENT_OTHER): Payer: Self-pay | Admitting: Obstetrics & Gynecology

## 2020-08-16 MED ORDER — FLUCONAZOLE 150 MG PO TABS: 150.0000 mg | ORAL_TABLET | Freq: Once | ORAL | 0 refills | Status: AC

## 2020-09-19 ENCOUNTER — Other Ambulatory Visit (HOSPITAL_COMMUNITY)
Admission: RE | Admit: 2020-09-19 | Discharge: 2020-09-19 | Disposition: A | Discharge status: Home / Self Care | Payer: Medicaid Other | Source: Ambulatory Visit | Attending: Obstetrics & Gynecology | Admitting: Obstetrics & Gynecology

## 2020-09-19 ENCOUNTER — Encounter (HOSPITAL_BASED_OUTPATIENT_CLINIC_OR_DEPARTMENT_OTHER): Payer: Self-pay | Admitting: Obstetrics & Gynecology

## 2020-09-19 ENCOUNTER — Other Ambulatory Visit: Payer: Self-pay

## 2020-09-19 ENCOUNTER — Ambulatory Visit (INDEPENDENT_AMBULATORY_CARE_PROVIDER_SITE_OTHER): Payer: Medicaid Other | Admitting: Obstetrics & Gynecology

## 2020-09-19 VITALS — BP 135/83 | HR 82 | Ht 71.0 in | Wt 154.8 lb

## 2020-09-19 DIAGNOSIS — N898 Other specified noninflammatory disorders of vagina: Secondary | ICD-10-CM

## 2020-09-19 DIAGNOSIS — T8131XA Disruption of external operation (surgical) wound, not elsewhere classified, initial encounter: Secondary | ICD-10-CM

## 2020-09-19 DIAGNOSIS — G8929 Other chronic pain: Secondary | ICD-10-CM | POA: Diagnosis not present

## 2020-09-19 DIAGNOSIS — M79601 Pain in right arm: Secondary | ICD-10-CM

## 2020-09-19 MED ORDER — GABAPENTIN 100 MG PO CAPS: 100.0000 mg | ORAL_CAPSULE | Freq: Three times a day (TID) | ORAL | 3 refills | Status: DC

## 2020-09-19 MED ORDER — METRONIDAZOLE 500 MG PO TABS
500.0000 mg | ORAL_TABLET | Freq: Two times a day (BID) | ORAL | 0 refills | Status: DC
Start: 2020-09-19 — End: 2020-11-01

## 2020-09-19 MED ORDER — METRONIDAZOLE 500 MG PO TABS: 500.0000 mg | ORAL_TABLET | Freq: Two times a day (BID) | ORAL | 0 refills | Status: DC

## 2020-09-20 LAB — CERVICOVAGINAL ANCILLARY ONLY
Bacterial Vaginitis (gardnerella): POSITIVE — AB
Candida Glabrata: NEGATIVE
Candida Vaginitis: NEGATIVE
Chlamydia: NEGATIVE
Comment: NEGATIVE
Comment: NEGATIVE
Comment: NEGATIVE
Comment: NEGATIVE
Comment: NEGATIVE
Comment: NORMAL
Neisseria Gonorrhea: NEGATIVE
Trichomonas: NEGATIVE

## 2020-09-21 ENCOUNTER — Encounter (HOSPITAL_BASED_OUTPATIENT_CLINIC_OR_DEPARTMENT_OTHER): Payer: Self-pay | Admitting: Obstetrics & Gynecology

## 2020-09-21 DIAGNOSIS — T81328A Disruption or dehiscence of closure of other specified internal operation (surgical) wound, initial encounter: Secondary | ICD-10-CM | POA: Insufficient documentation

## 2020-09-21 DIAGNOSIS — T8131XA Disruption of external operation (surgical) wound, not elsewhere classified, initial encounter: Secondary | ICD-10-CM | POA: Insufficient documentation

## 2020-09-21 NOTE — Progress Notes (Signed)
GYNECOLOGY  VISIT  CC:   post op recheck  HPI: 50 y.o. [id number] Single Black or African American female here for recheck after undergoing TLH/RSO/cysto on 09/19/2020.  She admits she had intercourse a few weeks ago.  Just once.  Didn't have any problem.  She as advised before surgery, at discharge and at one week post op appointment to NOT be SA for at least 12 weeks post op.  She is having a little discharge.  Denies bleeding or pain.  Bowel and bladder function is normal.     Separately, pt realized that taking gabapentin '100mg'$  TID post op actually helped a chronic pain she's had in her right arm.  She does have some mild tremor in that arm as well from time to time.  Had head injury as a child.  This helped with gabapentin as well.  Stopped the medication because she didn't want to be addicted but wants to know if this is ok to take or anything like it that is safe.  Safety discussed.  Pt aware this is ok to take long term without any addiction potential.   MEDS:   Current Outpatient Medications on File Prior to Visit  Medication Sig Dispense Refill   acetaminophen (TYLENOL) 500 MG tablet Take 1 tablet (500 mg total) by mouth every 6 (six) hours as needed (pain). 60 tablet 0   famotidine (PEPCID) 20 MG tablet Take 20 mg by mouth daily as needed for heartburn or indigestion.     No current facility-administered medications on file prior to visit.    SH:  Smoking Yes    PHYSICAL EXAMINATION:    BP 135/83 (BP Location: Left Arm, Patient Position: Sitting, Cuff Size: Small)   Pulse 82   Ht '5\' 11"'$  (1.803 m)   Wt 154 lb 12.8 oz (70.2 kg)   BMI 21.59 kg/m     General appearance: alert, cooperative and appears stated age Abdomen: soft, non-tender; bowel sounds normal; no masses,  no organomegaly Incisions:  C/D/I  Pelvic: External genitalia:  no lesions              Urethra:  normal appearing urethra with no masses, tenderness or lesions              Bartholins and Skenes: normal                  Vagina: partial thickness tear of apex present, scant discharge              Cervix: absent              Bimanual Exam:  Uterus:  normal size, contour, position, consistency, mobility, non-tender            Chaperone, Octaviano Batty, was present for exam.  Assessment/Plan: 1. Vaginal cuff dehiscence, initial encounter - pt clearly aware of physical exam findings.  Stressed importance of pelvic rest and risk for worsening symptoms.  She is returning to work but lifting precautions given.  Absolute pelvic rest discussed.  Pt voices clear understanding.  2. Vaginal discharge - Cervicovaginal ancillary only( [place]) - metroNIDAZOLE (FLAGYL) 500 MG tablet; Take 1 tablet (500 mg total) by mouth 2 (two) times daily.  Dispense: 14 tablet; Refill: 0  3. Chronic pain of right upper extremity - feel ok to continue gabapentin.  Rx sent to pharmacy. - gabapentin (NEURONTIN) 100 MG capsule; Take 1 capsule (100 mg total) by mouth 3 (three) times daily.  Dispense: 90 capsule; Refill:  3     

## 2020-09-26 ENCOUNTER — Encounter: Payer: Self-pay | Admitting: *Deleted

## 2020-10-02 ENCOUNTER — Other Ambulatory Visit: Payer: Self-pay

## 2020-10-02 ENCOUNTER — Ambulatory Visit (INDEPENDENT_AMBULATORY_CARE_PROVIDER_SITE_OTHER): Payer: Medicaid Other | Admitting: Obstetrics & Gynecology

## 2020-10-02 ENCOUNTER — Encounter (HOSPITAL_BASED_OUTPATIENT_CLINIC_OR_DEPARTMENT_OTHER): Payer: Self-pay | Admitting: Obstetrics & Gynecology

## 2020-10-02 VITALS — BP 138/93 | HR 68 | Ht 71.0 in | Wt 150.6 lb

## 2020-10-02 DIAGNOSIS — T8131XA Disruption of external operation (surgical) wound, not elsewhere classified, initial encounter: Secondary | ICD-10-CM

## 2020-10-02 NOTE — Progress Notes (Signed)
GYNECOLOGY  VISIT  CC:   post op recheck  HPI: 50 y.o. G22P2012 Single Black or African American female here for recheck after having cuff dehiscence from intercourse early.  Denies pelvic pain or bleeding.  Is not SA.  Reminded pt of importance.  She is working without any issues.  Marland Kitchen   MEDS:   Current Outpatient Medications on File Prior to Visit  Medication Sig Dispense Refill   acetaminophen (TYLENOL) 500 MG tablet Take 1 tablet (500 mg total) by mouth every 6 (six) hours as needed (pain). 60 tablet 0   famotidine (PEPCID) 20 MG tablet Take 20 mg by mouth daily as needed for heartburn or indigestion.     gabapentin (NEURONTIN) 100 MG capsule Take 1 capsule (100 mg total) by mouth 3 (three) times daily. 90 capsule 3   metroNIDAZOLE (FLAGYL) 500 MG tablet Take 1 tablet (500 mg total) by mouth 2 (two) times daily. 14 tablet 0   No current facility-administered medications on file prior to visit.    SH:  Smoking Yes    PHYSICAL EXAMINATION:    BP (!) 138/93 (BP Location: Right Arm, Patient Position: Sitting, Cuff Size: Small)   Pulse 68   Ht '5\' 11"'$  (1.803 m)   Wt 150 lb 9.6 oz (68.3 kg)   BMI 21.00 kg/m     General appearance: alert, cooperative and appears stated age Pelvic: External genitalia:  no lesions              Urethra:  normal appearing urethra with no masses, tenderness or lesions              Bartholins and Skenes: normal                 Vagina: cuff has reaaproximated, there is a loose v lock suture present, left this alone              Cervix: absent               Chaperone, Octaviano Batty, was present for exam.  Assessment/Plan: 1. Vaginal cuff dehiscence, initial encounter - strict pelvic rest for 10 more weeks discussed.  Pt understands she is basically staring over from healing standpoint.   - recheck 1 month.

## 2020-10-30 ENCOUNTER — Encounter: Payer: Self-pay | Admitting: Gastroenterology

## 2020-10-31 ENCOUNTER — Encounter (HOSPITAL_BASED_OUTPATIENT_CLINIC_OR_DEPARTMENT_OTHER): Payer: Medicaid Other | Admitting: Obstetrics & Gynecology

## 2020-11-01 ENCOUNTER — Other Ambulatory Visit (HOSPITAL_COMMUNITY)
Admission: RE | Admit: 2020-11-01 | Discharge: 2020-11-01 | Disposition: A | Discharge status: Home / Self Care | Payer: Medicaid Other | Source: Ambulatory Visit | Attending: Obstetrics & Gynecology | Admitting: Obstetrics & Gynecology

## 2020-11-01 ENCOUNTER — Other Ambulatory Visit: Payer: Self-pay

## 2020-11-01 ENCOUNTER — Ambulatory Visit (INDEPENDENT_AMBULATORY_CARE_PROVIDER_SITE_OTHER): Payer: Medicaid Other | Admitting: Obstetrics & Gynecology

## 2020-11-01 VITALS — BP 154/87 | HR 70 | Ht 71.0 in | Wt 155.0 lb

## 2020-11-01 DIAGNOSIS — N898 Other specified noninflammatory disorders of vagina: Secondary | ICD-10-CM

## 2020-11-01 MED ORDER — METRONIDAZOLE 500 MG PO TABS: 500.0000 mg | ORAL_TABLET | Freq: Two times a day (BID) | ORAL | 0 refills | Status: DC

## 2020-11-02 LAB — CERVICOVAGINAL ANCILLARY ONLY
Bacterial Vaginitis (gardnerella): POSITIVE — AB
Candida Glabrata: NEGATIVE
Candida Vaginitis: NEGATIVE
Comment: NEGATIVE
Comment: NEGATIVE
Comment: NEGATIVE

## 2020-11-03 ENCOUNTER — Encounter (HOSPITAL_BASED_OUTPATIENT_CLINIC_OR_DEPARTMENT_OTHER): Payer: Self-pay | Admitting: Obstetrics & Gynecology

## 2020-11-03 NOTE — Progress Notes (Signed)
GYNECOLOGY  VISIT  CC:   recheck and vaginal discharge  HPI: 50 y.o. [id number] Single Black or African American female here for recheck after having vaginal cuff dehiscence due to having intercourse at 4 weeks post op.  Pt reports new complaint of vaginal discharge.  Has been using vaginal suppositories from OTC product.  Discussed with pt pelvic rest and nothing in the vagina means NOTHING in the vagina.  No intercourse, no douching, no vaginal treatments or applicators--nothing.  Pt voices understanding.  Reports there is some vaginal odor.  Denies urinary symptoms.    GYNECOLOGIC HISTORY: Patient's last menstrual period was 03/12/2020.   Patient Active Problem List   Diagnosis Date Noted   Vaginal cuff dehiscence, initial encounter 09/21/2020   Pancreatitis 03/31/2020   Current moderate episode of major depressive disorder without prior episode ([name]) 11/23/2017   Nephrolithiasis 11/23/2017   Cervical spinal stenosis 02/11/2017   Spondylosis of cervical region without myelopathy or radiculopathy 11/04/2016   Essential hypertension 07/01/2016   Tobacco dependence 07/01/2016   ASCUS with positive high risk HPV cervical pap smear 12/02/2013    Past Medical History:  Diagnosis Date   History of seizures as a child    father dropped pt as a baby. Hasnt had a seizure in over 30 years.   Hypertension    Pancreatitis     Past Surgical History:  Procedure Laterality Date   CYSTOSCOPY N/A 08/01/2020   Procedure: CYSTOSCOPY;  Surgeon: Megan Salon, MD;  Location: [facility];  Service: Gynecology;  Laterality: N/A;   HERNIA REPAIR  1975   LEEP     years ago   NECK SURGERY  2017   with right shoulder surgery   TOTAL LAPAROSCOPIC HYSTERECTOMY WITH BILATERAL SALPINGO OOPHORECTOMY Bilateral 08/01/2020   Procedure: TOTAL LAPAROSCOPIC HYSTERECTOMY WITH BILATERAL SALPINGO OOPHORECTOMY;  Surgeon: Megan Salon, MD;  Location: [facility];  Service: Gynecology;  Laterality: Bilateral;    MEDS:    Current Outpatient Medications on File Prior to Visit  Medication Sig Dispense Refill   acetaminophen (TYLENOL) 500 MG tablet Take 1 tablet (500 mg total) by mouth every 6 (six) hours as needed (pain). 60 tablet 0   chlorthalidone (HYGROTON) 25 MG tablet Take 25 mg by mouth daily.     famotidine (PEPCID) 20 MG tablet Take 20 mg by mouth daily as needed for heartburn or indigestion.     gabapentin (NEURONTIN) 100 MG capsule Take 1 capsule (100 mg total) by mouth 3 (three) times daily. 90 capsule 3   No current facility-administered medications on file prior to visit.    ALLERGIES: [place]  Family History  Problem Relation Age of Onset   Lupus Mother    Pancreatic cancer Father     SH:  single, non smoker  Review of Systems  Constitutional: Negative.   Genitourinary: Negative.    PHYSICAL EXAMINATION:    BP (!) 154/87   Pulse 70   Ht 5\' 11"  (1.803 m)   Wt 155 lb (70.3 kg)   LMP 03/12/2020   BMI 21.62 kg/m     General appearance: alert, cooperative and appears stated age Lymph:  no inguinal LAD noted  Pelvic: External genitalia:  no lesions              Urethra:  normal appearing urethra with no masses, tenderness or lesions              Bartholins and Skenes: normal  Vagina: normal appearing vagina with normal color, whitish discharge noted,  some odor is present. Cuff with about 1cm area that is not fully closed at this point.  Suture visualized.  No granulation tissue noted.              Cervix: absent              Bimanual Exam:  Uterus:  uterus absent             Chaperone, Ezekiel Ina, CMA, was present for exam.  Assessment/Plan: 1. Vaginal discharge - Cervicovaginal ancillary only( [place]) - metroNIDAZOLE (FLAGYL) 500 MG tablet; Take 1 tablet (500 mg total) by mouth 2 (two) times daily.  Dispense: 14 tablet; Refill: 0  2.  Cuff dehiscence due to early intercourse - continues to heal and improve on physical exam - pt clearly understands  pelvic rest for 12 full weeks.  Advised to not put ANYTHING in the vagina until after next appt

## 2020-12-03 ENCOUNTER — Telehealth: Payer: Self-pay

## 2020-12-03 DIAGNOSIS — K862 Cyst of pancreas: Secondary | ICD-10-CM

## 2020-12-03 DIAGNOSIS — K859 Acute pancreatitis without necrosis or infection, unspecified: Secondary | ICD-10-CM

## 2020-12-03 DIAGNOSIS — K769 Liver disease, unspecified: Secondary | ICD-10-CM

## 2020-12-03 NOTE — Telephone Encounter (Signed)
-----   Message from Yevette Edwards, RN sent at 07/03/2020  9:31 AM EDT ----- Regarding: MRI MR Abdomen W WO contrast - pancreatic cyst, hx of pancreatitis

## 2020-12-03 NOTE — Telephone Encounter (Signed)
Spoke with patient to reminder her that she is due for repeat MRI at this time. Patient is aware that radiology scheduling will contact her directly to set up her MRI appt. Patient verbalized understanding and had no concerns at the end of the call.  MRI order in epic. Secure staff message sent to radiology schedulers to contact patient to set up her appt.

## 2020-12-17 ENCOUNTER — Ambulatory Visit (HOSPITAL_COMMUNITY)
Admission: RE | Admit: 2020-12-17 | Discharge: 2020-12-17 | Disposition: A | Discharge status: Home / Self Care | Payer: Medicaid Other | Source: Ambulatory Visit | Attending: Gastroenterology | Admitting: Gastroenterology

## 2020-12-17 ENCOUNTER — Encounter (HOSPITAL_BASED_OUTPATIENT_CLINIC_OR_DEPARTMENT_OTHER): Payer: Self-pay | Admitting: Obstetrics & Gynecology

## 2020-12-17 ENCOUNTER — Other Ambulatory Visit: Payer: Self-pay | Admitting: Gastroenterology

## 2020-12-17 ENCOUNTER — Other Ambulatory Visit: Payer: Self-pay

## 2020-12-17 DIAGNOSIS — K7689 Other specified diseases of liver: Secondary | ICD-10-CM | POA: Diagnosis not present

## 2020-12-17 DIAGNOSIS — K862 Cyst of pancreas: Secondary | ICD-10-CM | POA: Insufficient documentation

## 2020-12-17 DIAGNOSIS — K859 Acute pancreatitis without necrosis or infection, unspecified: Secondary | ICD-10-CM

## 2020-12-17 DIAGNOSIS — K769 Liver disease, unspecified: Secondary | ICD-10-CM | POA: Insufficient documentation

## 2020-12-17 DIAGNOSIS — R110 Nausea: Secondary | ICD-10-CM | POA: Diagnosis not present

## 2020-12-17 DIAGNOSIS — N281 Cyst of kidney, acquired: Secondary | ICD-10-CM | POA: Diagnosis not present

## 2020-12-17 IMAGING — MR MR ABDOMEN WO/W CM
20 of 21 series · 46 of 48 positions shown · IV contrast (gadavist)
Comparison: Multiple exams, including [DATE] and [DATE]

CLINICAL DATA: Liver lesion, pancreatic cystic lesion, history of
pancreatitis with upper abdominal pain and nausea.

EXAM:
MRI ABDOMEN WITHOUT AND WITH CONTRAST (INCLUDING MRCP)
TECHNIQUE: Multiplanar multisequence MR imaging of the abdomen was performed
both before and after the administration of intravenous contrast.
Heavily T2-weighted images of the biliary and pancreatic ducts were
obtained, and three-dimensional MRCP images were rendered by post
processing.
CONTRAST:  7mL GADAVIST GADOBUTROL 1 MMOL/ML IV SOLN

[Series 3: cor haste · coronal · 6.0mm · 1.12mm/px · 1 of 28 slices shown]
[im 1/28]
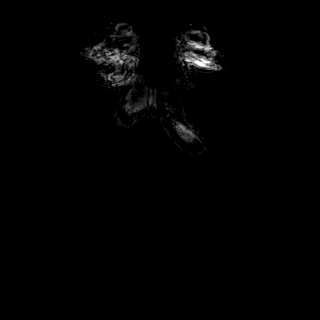

[Series 4: ax haste · axial · 6.0mm · 1.12mm/px · 1 of 40 slices shown]
[im 1/40]
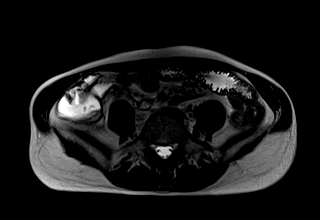

[Series 6: T2 fat-sat · axial · 6.0mm · 1.12mm/px · 1 of 42 slices shown]
[im 1/42]
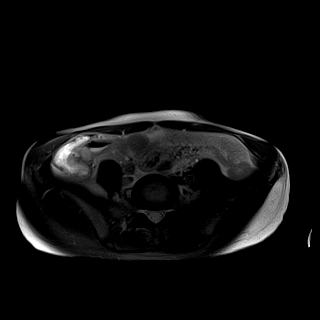

[Series 7: DWI · axial · 6.0mm · 1.34mm/px · 1 of 48 slices shown (1 of 4)]
[im 1/48]
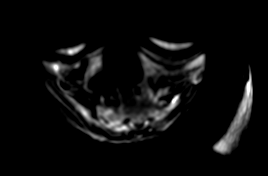

[Series 7: DWI · axial · 6.0mm · 1.34mm/px · z∈[-242,+96]mm · 2 of 48 slices shown (2 of 4)]
[im 1/48]
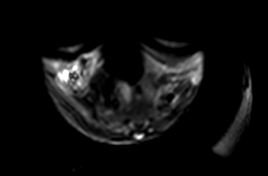
[im 48/48]
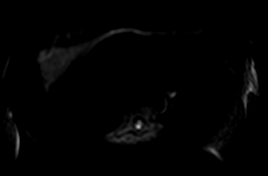

[Series 7: DWI · axial · 6.0mm · 1.34mm/px · z∈[-242,+96]mm · 2 of 48 slices shown (3 of 4)]
[im 1/48]
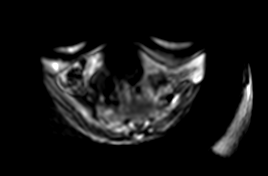
[im 48/48]
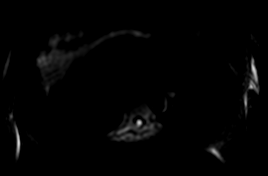

[Series 8: DWI · axial · 6.0mm · 1.34mm/px · z∈[-242,+96]mm · 2 of 47 slices shown (4 of 4)]
[im 1/47]
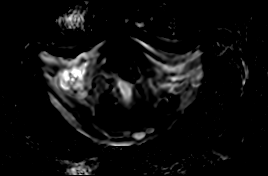
[im 47/47]
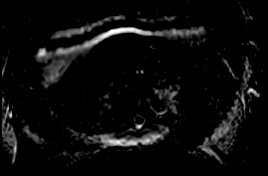

[Series 12: bSSFP · axial · 6.0mm · 0.70mm/px · 1 of 40 slices shown]
[im 1/40]
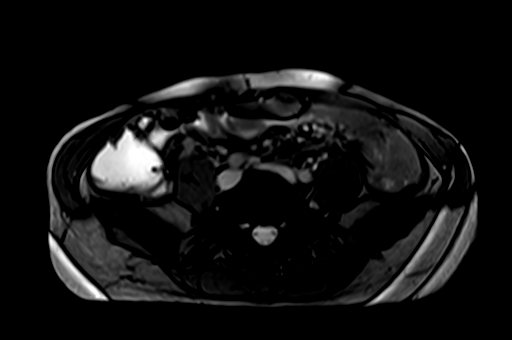

[Series 13: ax in and · axial · 3.0mm · 1.12mm/px · z∈[-203,+58]mm · 3 of 88 slices shown (1 of 2)]
[im 1/88]
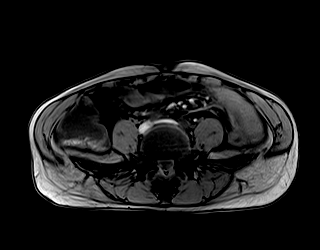
[im 44/88]
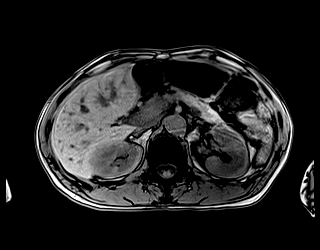
[im 88/88]
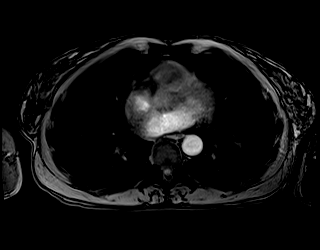

[Series 14: ax in and · axial · 3.0mm · 1.12mm/px · z∈[-203,+58]mm · 3 of 88 slices shown (2 of 2)]
[im 1/88]
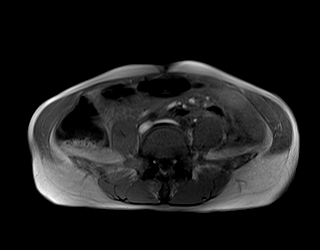
[im 44/88]
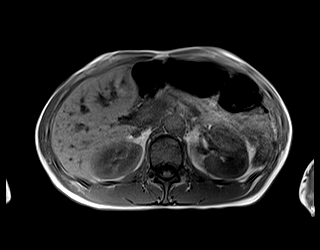
[im 88/88]
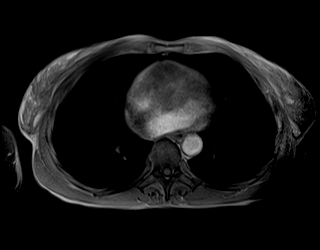

[Series 15: T1 dynamic · axial · non-contrast · 3.0mm · 1.12mm/px · z∈[-203,+58]mm · 3 of 88 slices shown (1 of 4)]
[im 1/88]
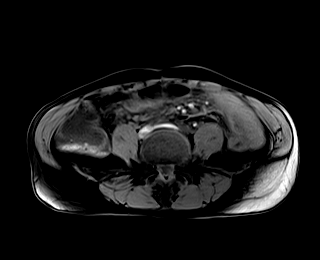
[im 44/88]
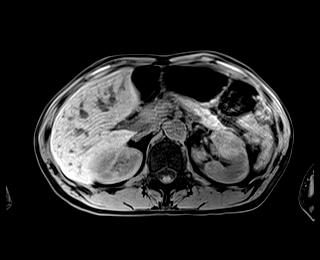
[im 88/88]
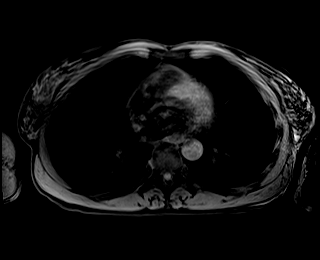

[Series 17: T1 dynamic post-contrast · axial · 3.0mm · 1.12mm/px · z∈[-203,+58]mm · 3 of 88 slices shown (1 of 6)]
[im 1/88]
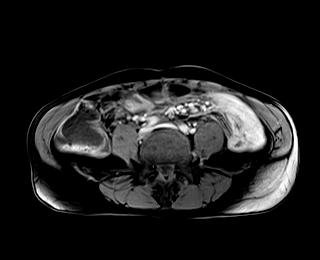
[im 44/88]
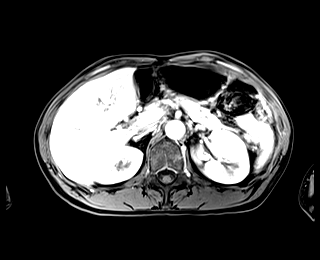
[im 88/88]
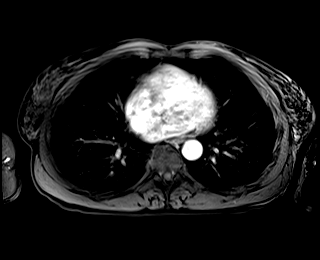

[Series 18: T1 dynamic · axial · 3.0mm · 1.12mm/px · z∈[-203,+58]mm · 3 of 88 slices shown (2 of 4)]
[im 1/88]
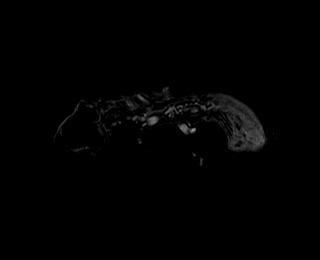
[im 44/88]
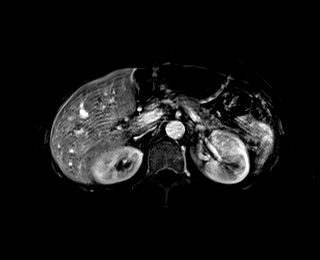
[im 88/88]
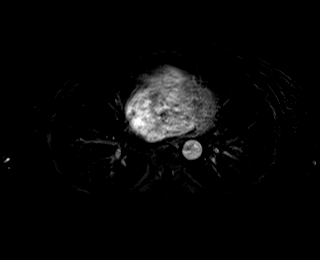

[Series 19: T1 dynamic post-contrast · axial · 3.0mm · 1.12mm/px · z∈[-203,+58]mm · 3 of 88 slices shown (2 of 6)]
[im 1/88]
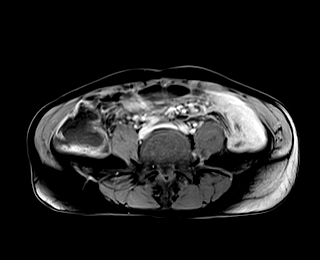
[im 44/88]
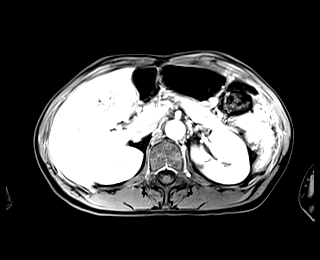
[im 88/88]
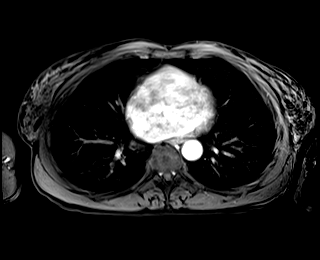

[Series 20: T1 dynamic · axial · 3.0mm · 1.12mm/px · z∈[-203,+58]mm · 3 of 88 slices shown (3 of 4)]
[im 1/88]
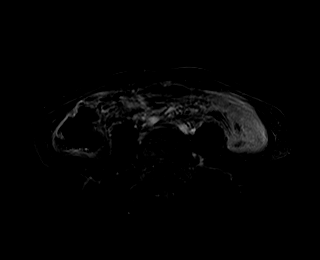
[im 44/88]
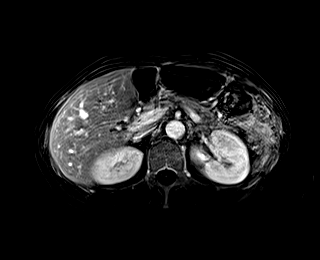
[im 88/88]
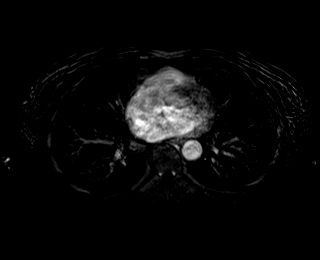

[Series 21: T1 dynamic post-contrast · axial · 3.0mm · 1.12mm/px · z∈[-203,+58]mm · 3 of 88 slices shown (3 of 6)]
[im 1/88]
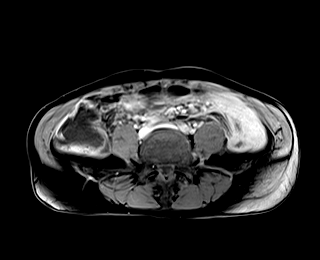
[im 44/88]
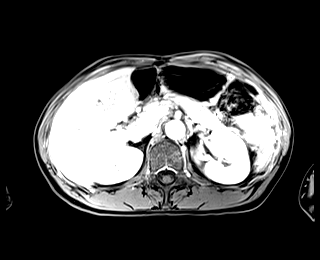
[im 88/88]
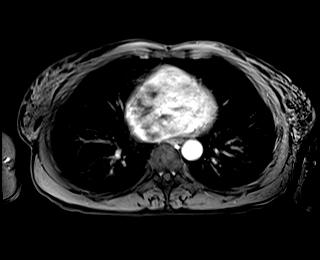

[Series 22: T1 dynamic · axial · 3.0mm · 1.12mm/px · z∈[-203,+58]mm · 3 of 88 slices shown (4 of 4)]
[im 1/88]
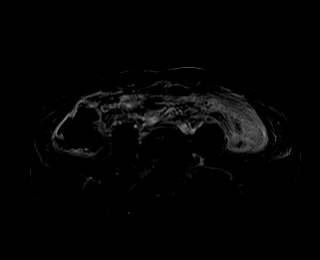
[im 44/88]
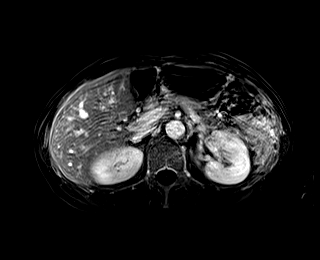
[im 88/88]
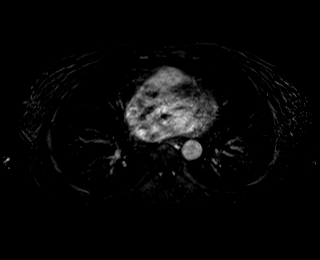

[Series 23: T1 dynamic post-contrast · coronal · 3.0mm · 1.19mm/px · 2 of 64 slices shown (4 of 6)]
[im 1/64]
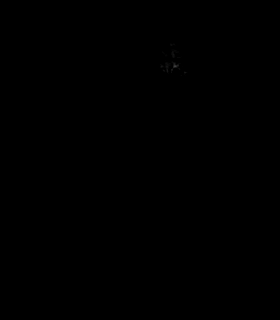
[im 64/64]
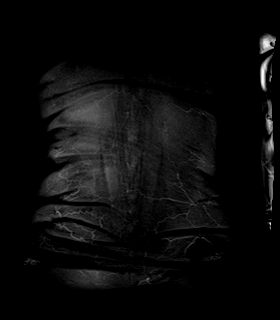

[Series 24: T1 dynamic post-contrast · axial · 3.0mm · 1.12mm/px · z∈[-203,+58]mm · 3 of 88 slices shown (5 of 6)]
[im 1/88]
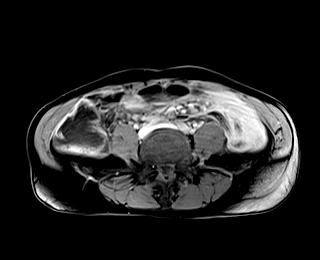
[im 44/88]
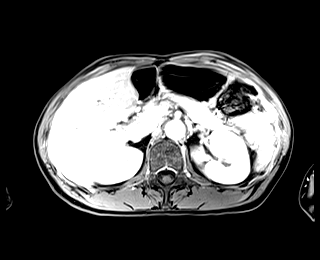
[im 88/88]
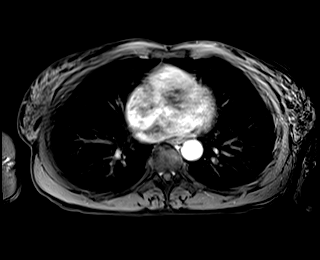

[Series 25: T1 dynamic post-contrast · axial · 3.0mm · 1.12mm/px · z∈[-203,+58]mm · 3 of 88 slices shown (6 of 6)]
[im 1/88]
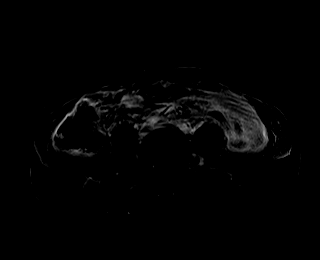
[im 44/88]
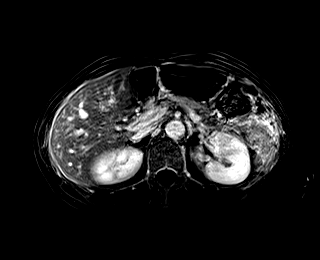
[im 88/88]
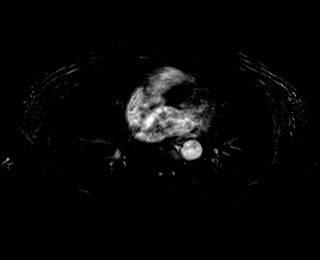

[46 of 48 positions shown; findings below may reference images not displayed]

FINDINGS: Lower chest: Unremarkable

Hepatobiliary: On the prior exam there is a 1.5 cm in diameter focus
of mild early arterial phase enhancement in segment 8 of the liver
(image 19, series [DATE] of [DATE] exam). This finding is no longer
present. Given the lack of arterial phase enhancement in this region
on today's exam, this is somewhat less likely to be focal nodular
hyperplasia, and more likely to be from transient hepatic
attenuation difference on the arterial phase images. I do not see a
well-defined lesion in this region although there is some
questionable punctate restriction of diffusion on image 14 series 7
which is too small to status factor EMMA LIISA characterize. No lesion was
appreciable on [DATE].

Gallbladder unremarkable.  No biliary dilatation.

Pancreas: No abnormal pancreatic cystic lesion currently identified.
No pancreatic or peripancreatic edema to indicate current acute
pancreatitis. No abnormal hypoenhancing regions of the pancreas. No
pancreatic mass is observed. No pancreas divisum.

Spleen:  Unremarkable.  Small accessory spleen noted.

Adrenals/Urinary Tract: Very small peripheral renal cysts. Adrenal
glands unremarkable.

Stomach/Bowel: Unremarkable

Vascular/Lymphatic: Atherosclerosis is present, including aortoiliac
atherosclerotic disease. No pathologic upper abdominal adenopathy.

Other: Suspected trace ascites along the inferior tip of the liver,
cause uncertain.

Musculoskeletal: Unremarkable
IMPRESSION: 1. Previously seen focus of arterial phase enhancement in segment 8
of the liver is no longer visualized, and was not visible on
[DATE]. This raises the likelihood this may have simply
represented focal to benign transient hepatic attenuation
difference. Questionable EMMA LIISA of restriction of diffusion in this
vicinity on today's exam, but no lesion visible on any other
sequence.
2. Currently unremarkable appearance of the pancreas.
3. Trace ascites along the inferior posterior edge of the right
hepatic lobe, cause uncertain.
4. Chronic lower abdominal aortic atherosclerosis.

## 2020-12-17 MED ORDER — GADOBUTROL 1 MMOL/ML IV SOLN
7.0000 mL | Freq: Once | INTRAVENOUS | Status: AC | PRN
  Administered 2020-12-17: 14:00:00 7 mL via INTRAVENOUS

## 2020-12-18 ENCOUNTER — Encounter (HOSPITAL_COMMUNITY): Payer: Self-pay

## 2020-12-18 ENCOUNTER — Emergency Department (HOSPITAL_COMMUNITY)
Admission: EM | Admit: 2020-12-18 | Discharge: 2020-12-18 | Disposition: A | Discharge status: Home / Self Care | Payer: Medicaid Other | Attending: Emergency Medicine | Admitting: Emergency Medicine

## 2020-12-18 ENCOUNTER — Emergency Department (HOSPITAL_COMMUNITY): Payer: Medicaid Other

## 2020-12-18 DIAGNOSIS — R000 Tachycardia, unspecified: Secondary | ICD-10-CM | POA: Diagnosis not present

## 2020-12-18 DIAGNOSIS — F17210 Nicotine dependence, cigarettes, uncomplicated: Secondary | ICD-10-CM | POA: Diagnosis not present

## 2020-12-18 DIAGNOSIS — N12 Tubulo-interstitial nephritis, not specified as acute or chronic: Secondary | ICD-10-CM | POA: Insufficient documentation

## 2020-12-18 DIAGNOSIS — N9489 Other specified conditions associated with female genital organs and menstrual cycle: Secondary | ICD-10-CM | POA: Insufficient documentation

## 2020-12-18 DIAGNOSIS — I10 Essential (primary) hypertension: Secondary | ICD-10-CM | POA: Insufficient documentation

## 2020-12-18 DIAGNOSIS — R079 Chest pain, unspecified: Secondary | ICD-10-CM | POA: Insufficient documentation

## 2020-12-18 DIAGNOSIS — J9811 Atelectasis: Secondary | ICD-10-CM | POA: Diagnosis not present

## 2020-12-18 DIAGNOSIS — R109 Unspecified abdominal pain: Secondary | ICD-10-CM | POA: Diagnosis present

## 2020-12-18 LAB — CBC
HCT: 41 % (ref 36.0–46.0)
Hemoglobin: 13.6 g/dL (ref 12.0–15.0)
MCH: 28.6 pg (ref 26.0–34.0)
MCHC: 33.2 g/dL (ref 30.0–36.0)
MCV: 86.3 fL (ref 80.0–100.0)
Platelets: 283 10*3/uL (ref 150–400)
RBC: 4.75 MIL/uL (ref 3.87–5.11)
RDW: 15.3 % (ref 11.5–15.5)
WBC: 19 10*3/uL — ABNORMAL HIGH (ref 4.0–10.5)
nRBC: 0 % (ref 0.0–0.2)

## 2020-12-18 LAB — URINALYSIS, ROUTINE W REFLEX MICROSCOPIC
Bilirubin Urine: NEGATIVE
Glucose, UA: NEGATIVE mg/dL
Ketones, ur: 5 mg/dL — AB
Nitrite: NEGATIVE
Protein, ur: 30 mg/dL — AB
RBC / HPF: >50 RBC/hpf — ABNORMAL HIGH (ref 0–5)
Specific Gravity, Urine: 1.02 (ref 1.005–1.030)
WBC, UA: >50 WBC/hpf — ABNORMAL HIGH (ref 0–5)
pH: 5 (ref 5.0–8.0)

## 2020-12-18 LAB — BASIC METABOLIC PANEL
Anion gap: 12 (ref 5–15)
BUN: 18 mg/dL (ref 6–20)
CO2: 20 mmol/L — ABNORMAL LOW (ref 22–32)
Calcium: 9.6 mg/dL (ref 8.9–10.3)
Chloride: 102 mmol/L (ref 98–111)
Creatinine, Ser: 0.93 mg/dL (ref 0.44–1.00)
GFR, Estimated: >60 mL/min (ref >60)
Glucose, Bld: 109 mg/dL — ABNORMAL HIGH (ref 70–99)
Potassium: 3.7 mmol/L (ref 3.5–5.1)
Sodium: 134 mmol/L — ABNORMAL LOW (ref 135–145)

## 2020-12-18 LAB — HCG, QUANTITATIVE, PREGNANCY: hCG, Beta Chain, Quant, S: 1 m[IU]/mL (ref <5)

## 2020-12-18 LAB — TROPONIN I (HIGH SENSITIVITY)
Troponin I (High Sensitivity): <2 ng/L (ref <18)
Troponin I (High Sensitivity): 2 ng/L (ref <18)

## 2020-12-18 LAB — HEPATIC FUNCTION PANEL
ALT: 12 U/L (ref 0–44)
AST: 18 U/L (ref 15–41)
Albumin: 4 g/dL (ref 3.5–5.0)
Alkaline Phosphatase: 74 U/L (ref 38–126)
Bilirubin, Direct: 0.2 mg/dL (ref 0.0–0.2)
Indirect Bilirubin: 0.6 mg/dL (ref 0.3–0.9)
Total Bilirubin: 0.8 mg/dL (ref 0.3–1.2)
Total Protein: 8.7 g/dL — ABNORMAL HIGH (ref 6.5–8.1)

## 2020-12-18 LAB — LIPASE, BLOOD: Lipase: 25 U/L (ref 11–51)

## 2020-12-18 IMAGING — CR DG CHEST 2V
2 series · 2 of 2 positions shown · non-contrast
Comparison: [DATE]

CLINICAL DATA: Mid chest pain.

EXAM:
CHEST - 2 VIEW

[w chest lat]
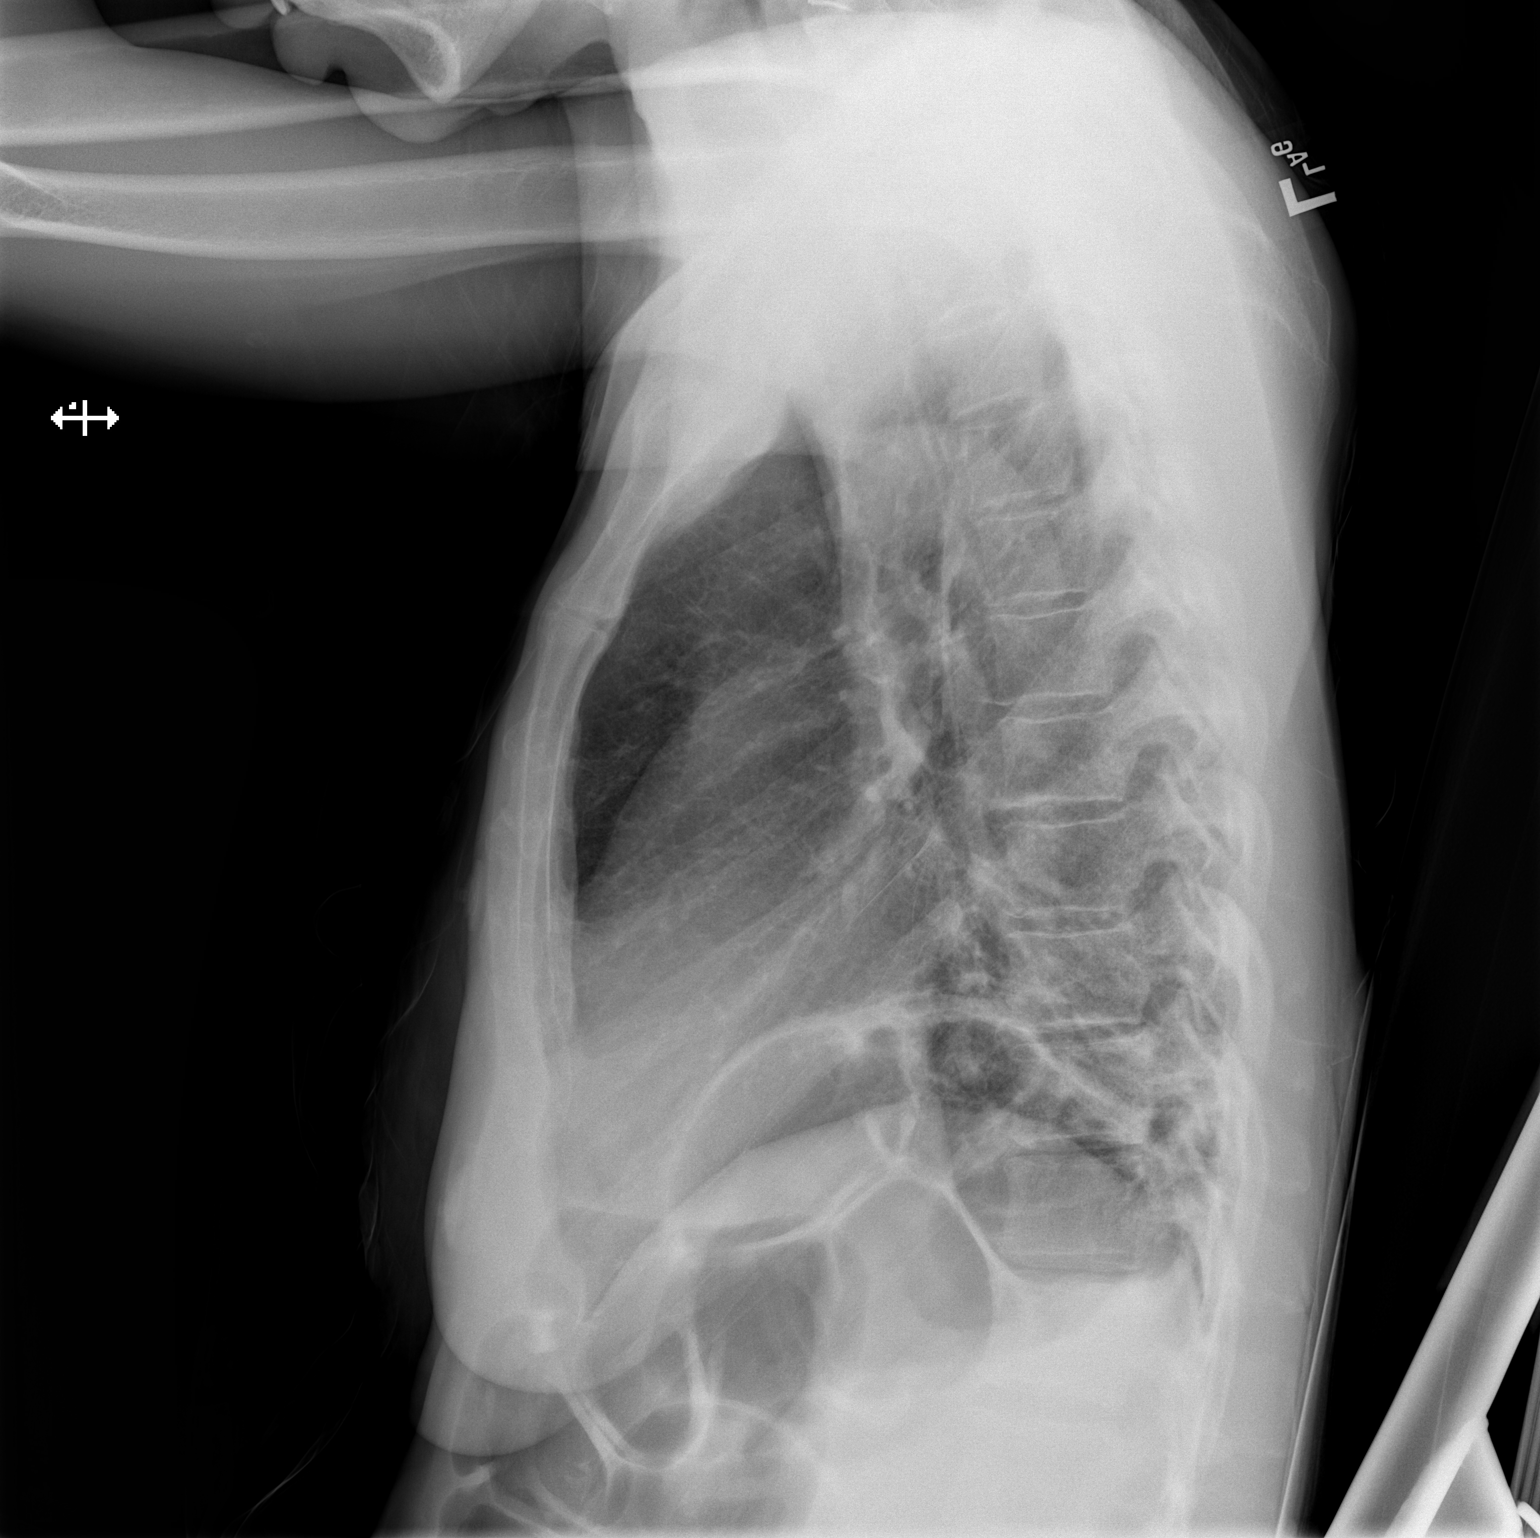

[x chest ap]
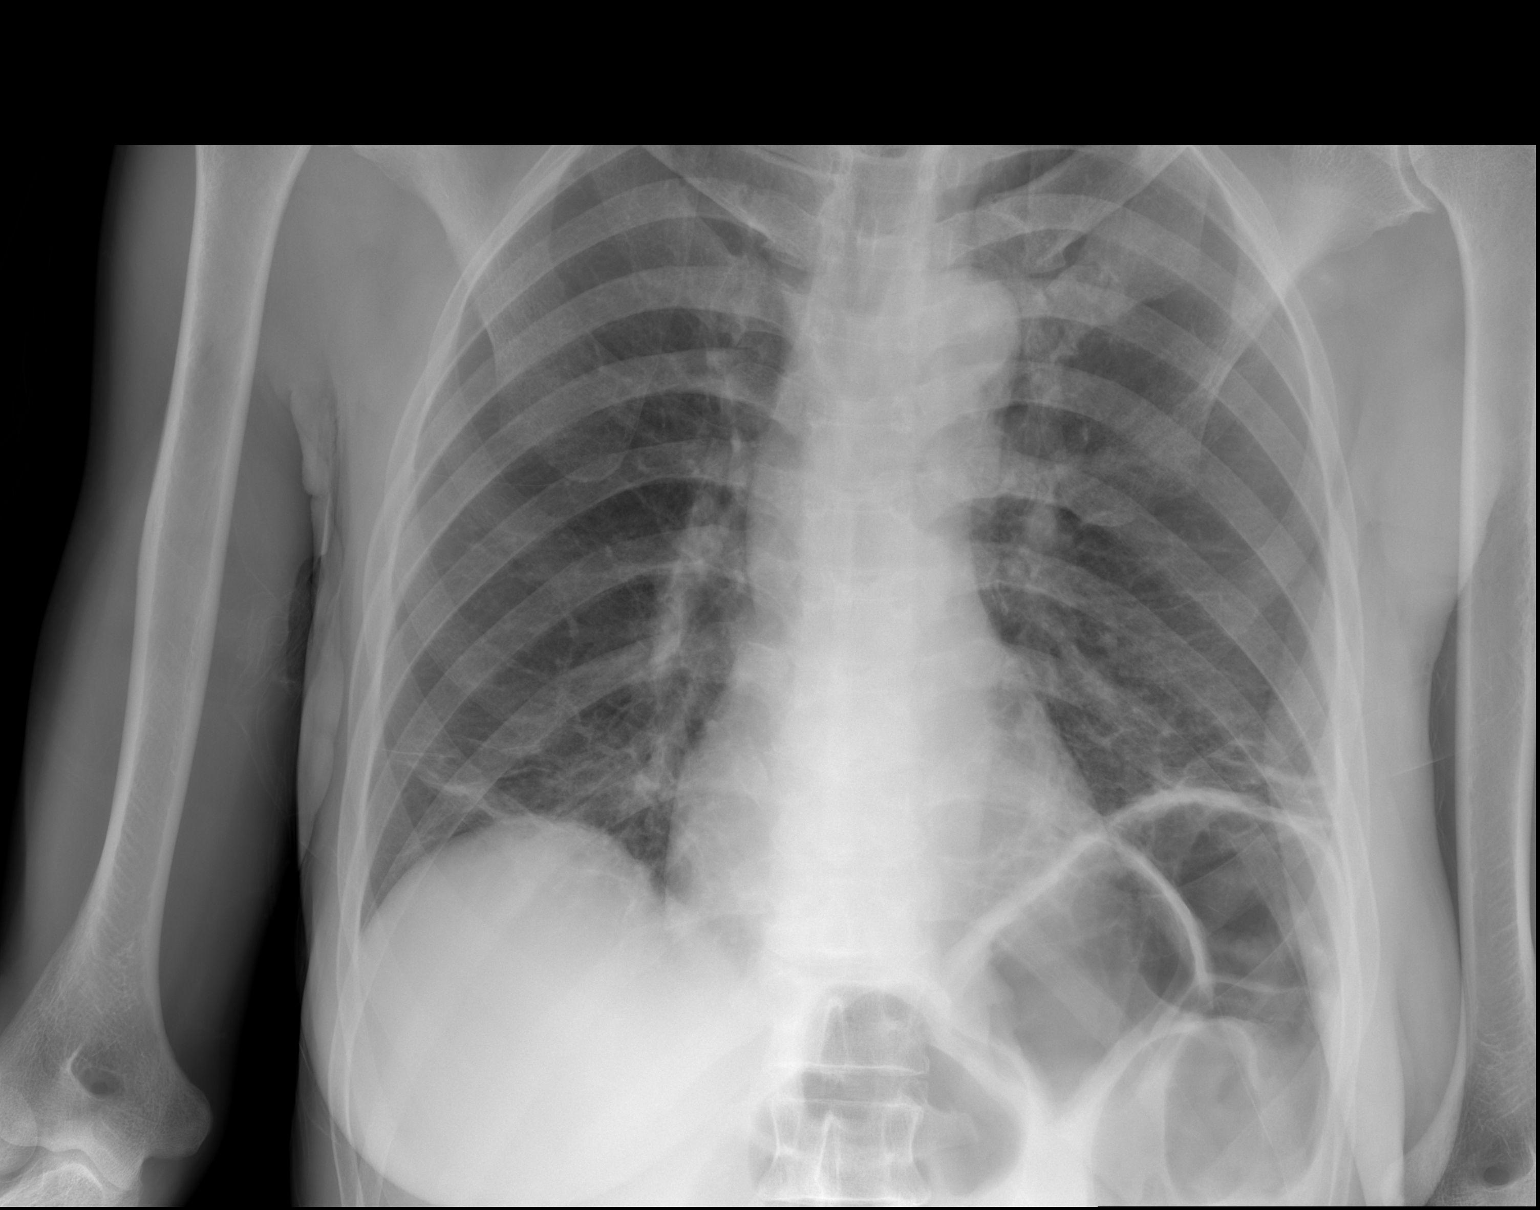

[2 of 2 positions shown; findings below may reference images not displayed]

FINDINGS: Mild atelectasis is noted within the bilateral lung bases. There is
no evidence of acute infiltrate, pleural effusion or pneumothorax.
The heart size and mediastinal contours are within normal limits.
The visualized skeletal structures are unremarkable.
IMPRESSION: Mild bibasilar atelectasis.

## 2020-12-18 MED ORDER — CEFUROXIME AXETIL 500 MG PO TABS: 500.0000 mg | ORAL_TABLET | Freq: Two times a day (BID) | ORAL | 0 refills | Status: AC

## 2020-12-18 MED ORDER — ONDANSETRON HCL 4 MG PO TABS: 4.0000 mg | ORAL_TABLET | Freq: Four times a day (QID) | ORAL | 0 refills | Status: DC

## 2020-12-18 MED ORDER — NAPROXEN 375 MG PO TABS: 375.0000 mg | ORAL_TABLET | Freq: Two times a day (BID) | ORAL | 0 refills | Status: DC

## 2020-12-18 MED ORDER — KETOROLAC TROMETHAMINE 30 MG/ML IJ SOLN
30.0000 mg | Freq: Once | INTRAMUSCULAR | Status: AC
  Administered 2020-12-18: 30 mg via INTRAVENOUS

## 2020-12-18 MED ORDER — ONDANSETRON HCL 4 MG/2ML IJ SOLN
4.0000 mg | Freq: Once | INTRAMUSCULAR | Status: AC
  Administered 2020-12-18: 4 mg via INTRAVENOUS

## 2020-12-18 MED ORDER — SODIUM CHLORIDE 0.9 % IV SOLN
2.0000 g | Freq: Once | INTRAVENOUS | Status: AC
  Administered 2020-12-18: 2 g via INTRAVENOUS

## 2020-12-18 MED ORDER — HYDROCODONE-ACETAMINOPHEN 5-325 MG PO TABS: 1.0000 | ORAL_TABLET | Freq: Four times a day (QID) | ORAL | 0 refills | Status: DC | PRN

## 2020-12-18 MED ORDER — MORPHINE SULFATE (PF) 4 MG/ML IV SOLN
4.0000 mg | Freq: Once | INTRAVENOUS | Status: AC
  Administered 2020-12-18: 4 mg via INTRAVENOUS

## 2020-12-18 MED ORDER — LACTATED RINGERS IV BOLUS
2000.0000 mL | Freq: Once | INTRAVENOUS | Status: AC
  Administered 2020-12-18: 2000 mL via INTRAVENOUS

## 2020-12-18 NOTE — ED Provider Notes (Signed)
[facility] DEPT Provider Note   CSN: [id number] Arrival date & time: 12/18/20  0253     History Chief Complaint  Patient presents with   Chest Pain   Abdominal Pain   Fever    Mary Ramos is a 50 y.o. female.   Chest Pain Associated symptoms: abdominal pain and fever   Abdominal Pain Associated symptoms: chest pain and fever   Fever Associated symptoms: chest pain    Patient presents to the ER for evaluation of abdominal pain and feeling feverish.  Patient states symptoms started several days ago.  It has been increasing in severity.  Now it is hard for her to even walk.  She is having pain primarily in the lower abdomen.  It is a cramping pain and increases with walking and palpation.  She has not had any issues with nausea vomiting.  He has noticed some blood in her urine.  She has had a hysterectomy and has not had any vaginal discharge or bleeding. Patient had an MRI of her abdomen yesterday with and without contrast to follow-up on the liver lesion and pancreatic lesion.  The MRI showed no visible liver lesion.  She had an unremarkable pancreas.  She had trace ascites noted along the posterior inferior edge of the right hepatic lobe Past Medical History:  Diagnosis Date   History of seizures as a child    father dropped pt as a baby. Hasnt had a seizure in over 30 years.   Hypertension    Pancreatitis     Patient Active Problem List   Diagnosis Date Noted   Vaginal cuff dehiscence, initial encounter 09/21/2020   Pancreatitis 03/31/2020   Current moderate episode of major depressive disorder without prior episode ([place]) 11/23/2017   Nephrolithiasis 11/23/2017   Cervical spinal stenosis 02/11/2017   Spondylosis of cervical region without myelopathy or radiculopathy 11/04/2016   Essential hypertension 07/01/2016   Tobacco dependence 07/01/2016   ASCUS with positive high risk HPV cervical pap smear 12/02/2013    Past Surgical  History:  Procedure Laterality Date   CYSTOSCOPY N/A 08/01/2020   Procedure: CYSTOSCOPY;  Surgeon: Megan Salon, MD;  Location: [facility];  Service: Gynecology;  Laterality: N/A;   HERNIA REPAIR  1975   LEEP     years ago   NECK SURGERY  2017   with right shoulder surgery   TOTAL LAPAROSCOPIC HYSTERECTOMY WITH BILATERAL SALPINGO OOPHORECTOMY Bilateral 08/01/2020   Procedure: TOTAL LAPAROSCOPIC HYSTERECTOMY WITH BILATERAL SALPINGO OOPHORECTOMY;  Surgeon: Megan Salon, MD;  Location: [facility];  Service: Gynecology;  Laterality: Bilateral;     OB History     Gravida  3   Para  2   Term  2   Preterm  0   AB  1   Living  2      SAB  0   IAB  0   Ectopic  0   Multiple  0   Live Births              Family History  Problem Relation Age of Onset   Lupus Mother    Pancreatic cancer Father     Social History   Tobacco Use   Smoking status: Every Day    Packs/day: 0.25    Types: Cigarettes   Smokeless tobacco: Never   Tobacco comments:    2 per day  Vaping Use   Vaping Use: Never used  Substance Use Topics   Alcohol use:  No    Alcohol/week: 1.0 standard drink    Types: 1 Standard drinks or equivalent per week   Drug use: Yes    Types: Marijuana    Comment: everyday. currently holding for procedure.    Home Medications Prior to Admission medications   Medication Sig Start Date End Date Taking? Authorizing Provider  cefUROXime (CEFTIN) 500 MG tablet Take 1 tablet (500 mg total) by mouth 2 (two) times daily with a meal for 10 days. 12/18/20 12/28/20 Yes Dorie Rank, MD  HYDROcodone-acetaminophen (NORCO/VICODIN) 5-325 MG tablet Take 1 tablet by mouth every 6 (six) hours as needed. 12/18/20  Yes Dorie Rank, MD  naproxen (NAPROSYN) 375 MG tablet Take 1 tablet (375 mg total) by mouth 2 (two) times daily. 12/18/20  Yes Dorie Rank, MD  ondansetron (ZOFRAN) 4 MG tablet Take 1 tablet (4 mg total) by mouth every 6 (six) hours. 12/18/20  Yes Dorie Rank, MD  acetaminophen  (TYLENOL) 500 MG tablet Take 1 tablet (500 mg total) by mouth every 6 (six) hours as needed (pain). 04/21/20   Jaquita Folds, MD  chlorthalidone (HYGROTON) 25 MG tablet Take 25 mg by mouth daily. 10/24/20   [provider]  famotidine (PEPCID) 20 MG tablet Take 20 mg by mouth daily as needed for heartburn or indigestion. 04/15/20   [provider]  gabapentin (NEURONTIN) 100 MG capsule Take 1 capsule (100 mg total) by mouth 3 (three) times daily. 09/19/20   Megan Salon, MD  metroNIDAZOLE (FLAGYL) 500 MG tablet Take 1 tablet (500 mg total) by mouth 2 (two) times daily. 11/01/20   Megan Salon, MD    Allergies    Carbomer  Review of Systems   Review of Systems  Constitutional:  Positive for fever.  Cardiovascular:  Positive for chest pain.  Gastrointestinal:  Positive for abdominal pain.  All other systems reviewed and are negative.  Physical Exam Updated Vital Signs BP 135/85   Pulse 86   Temp 99.8 F (37.7 C) (Oral)   Resp 18   Ht 1.803 m (5\' 11" )   Wt 63.5 kg   LMP 03/12/2020   SpO2 100%   BMI 19.53 kg/m   Physical Exam Vitals and nursing note reviewed.  Constitutional:      General: She is not in acute distress.    Appearance: She is well-developed.  HENT:     Head: Normocephalic and atraumatic.     Right Ear: External ear normal.     Left Ear: External ear normal.  Eyes:     General: No scleral icterus.       Right eye: No discharge.        Left eye: No discharge.     Conjunctiva/sclera: Conjunctivae normal.  Neck:     Trachea: No tracheal deviation.  Cardiovascular:     Rate and Rhythm: Normal rate and regular rhythm.  Pulmonary:     Effort: Pulmonary effort is normal. No respiratory distress.     Breath sounds: Normal breath sounds. No stridor. No wheezing or rales.  Abdominal:     General: Bowel sounds are normal. There is no distension.     Palpations: Abdomen is soft.     Tenderness: There is abdominal tenderness in the  suprapubic area. There is guarding. There is no rebound.  Musculoskeletal:        General: No tenderness or deformity.     Cervical back: Neck supple.  Skin:    General: Skin is warm and dry.  Findings: No rash.  Neurological:     General: No focal deficit present.     Mental Status: She is alert.     Cranial Nerves: No cranial nerve deficit (no facial droop, extraocular movements intact, no slurred speech).     Sensory: No sensory deficit.     Motor: No abnormal muscle tone or seizure activity.     Coordination: Coordination normal.  Psychiatric:        Mood and Affect: Mood normal.    ED Results / Procedures / Treatments   Labs (all labs ordered are listed, but only abnormal results are displayed) Labs Reviewed  BASIC METABOLIC PANEL - Abnormal; Notable for the following components:      Result Value   Sodium 134 (*)    CO2 20 (*)    Glucose, Bld 109 (*)    All other components within normal limits  CBC - Abnormal; Notable for the following components:   WBC 19.0 (*)    All other components within normal limits  URINALYSIS, ROUTINE W REFLEX MICROSCOPIC - Abnormal; Notable for the following components:   APPearance CLOUDY (*)    Hgb urine dipstick LARGE (*)    Ketones, ur 5 (*)    Protein, ur 30 (*)    Leukocytes,Ua LARGE (*)    RBC / HPF >50 (*)    WBC, UA >50 (*)    Bacteria, UA MANY (*)    All other components within normal limits  HEPATIC FUNCTION PANEL - Abnormal; Notable for the following components:   Total Protein 8.7 (*)    All other components within normal limits  LIPASE, BLOOD  HCG, QUANTITATIVE, PREGNANCY  TROPONIN I (HIGH SENSITIVITY)  TROPONIN I (HIGH SENSITIVITY)    EKG EKG Interpretation  Date/Time:  Tuesday December 18 2020 03:03:00 EST Ventricular Rate:  113 PR Interval:  148 QRS Duration: 80 QT Interval:  318 QTC Calculation: 436 R Axis:   39 Text Interpretation: Sinus tachycardia Right atrial enlargement Nonspecific ST and T wave  abnormality Abnormal ECG Since last tracing rate faster Confirmed by Dorie Rank [phone]) on 12/18/2020 11:45:51 AM  Radiology DG Chest 2 View  Result Date: 12/18/2020 CLINICAL DATA:  Mid chest pain. EXAM: CHEST - 2 VIEW COMPARISON:  September 25, 2016 FINDINGS: Mild atelectasis is noted within the bilateral lung bases. There is no evidence of acute infiltrate, pleural effusion or pneumothorax. The heart size and mediastinal contours are within normal limits. The visualized skeletal structures are unremarkable. IMPRESSION: Mild bibasilar atelectasis. Electronically Signed   By: Virgina Norfolk M.D.   On: 12/18/2020 04:02   MR ABDOMEN W WO CONTRAST  Result Date: 12/18/2020 CLINICAL DATA:  Liver lesion, pancreatic cystic lesion, history of pancreatitis with upper abdominal pain and nausea. EXAM: MRI ABDOMEN WITHOUT AND WITH CONTRAST (INCLUDING MRCP) TECHNIQUE: Multiplanar multisequence MR imaging of the abdomen was performed both before and after the administration of intravenous contrast. Heavily T2-weighted images of the biliary and pancreatic ducts were obtained, and three-dimensional MRCP images were rendered by post processing. CONTRAST:  34mL GADAVIST GADOBUTROL 1 MMOL/ML IV SOLN COMPARISON:  Multiple exams, including 06/29/2020 and 03/31/2020 FINDINGS: Lower chest: Unremarkable Hepatobiliary: On the prior exam there is a 1.5 cm in diameter focus of mild early arterial phase enhancement in segment 8 of the liver (image 19, series 21 of 06/29/2020 exam). This finding is no longer present. Given the lack of arterial phase enhancement in this region on today's exam, this is somewhat less likely to be  focal nodular hyperplasia, and more likely to be from transient hepatic attenuation difference on the arterial phase images. I do not see a well-defined lesion in this region although there is some questionable punctate restriction of diffusion on image 14 series 7 which is too small to status factor Deatra Canter  characterize. No lesion was appreciable on 03/31/2020. Gallbladder unremarkable.  No biliary dilatation. Pancreas: No abnormal pancreatic cystic lesion currently identified. No pancreatic or peripancreatic edema to indicate current acute pancreatitis. No abnormal hypoenhancing regions of the pancreas. No pancreatic mass is observed. No pancreas divisum. Spleen:  Unremarkable.  Small accessory spleen noted. Adrenals/Urinary Tract: Very small peripheral renal cysts. Adrenal glands unremarkable. Stomach/Bowel: Unremarkable Vascular/Lymphatic: Atherosclerosis is present, including aortoiliac atherosclerotic disease. No pathologic upper abdominal adenopathy. Other: Suspected trace ascites along the inferior tip of the liver, cause uncertain. Musculoskeletal: Unremarkable IMPRESSION: 1. Previously seen focus of arterial phase enhancement in segment 8 of the liver is no longer visualized, and was not visible on 03/31/2020. This raises the likelihood this may have simply represented focal to benign transient hepatic attenuation difference. Questionable speck of restriction of diffusion in this vicinity on today's exam, but no lesion visible on any other sequence. 2. Currently unremarkable appearance of the pancreas. 3. Trace ascites along the inferior posterior edge of the right hepatic lobe, cause uncertain. 4. Chronic lower abdominal aortic atherosclerosis. Electronically Signed   By: Van Clines M.D.   On: 12/18/2020 08:03   MR 3D Recon At Scanner  Result Date: 12/18/2020 CLINICAL DATA:  Liver lesion, pancreatic cystic lesion, history of pancreatitis with upper abdominal pain and nausea. EXAM: MRI ABDOMEN WITHOUT AND WITH CONTRAST (INCLUDING MRCP) TECHNIQUE: Multiplanar multisequence MR imaging of the abdomen was performed both before and after the administration of intravenous contrast. Heavily T2-weighted images of the biliary and pancreatic ducts were obtained, and three-dimensional MRCP images were  rendered by post processing. CONTRAST:  33mL GADAVIST GADOBUTROL 1 MMOL/ML IV SOLN COMPARISON:  Multiple exams, including 06/29/2020 and 03/31/2020 FINDINGS: Lower chest: Unremarkable Hepatobiliary: On the prior exam there is a 1.5 cm in diameter focus of mild early arterial phase enhancement in segment 8 of the liver (image 19, series 21 of 06/29/2020 exam). This finding is no longer present. Given the lack of arterial phase enhancement in this region on today's exam, this is somewhat less likely to be focal nodular hyperplasia, and more likely to be from transient hepatic attenuation difference on the arterial phase images. I do not see a well-defined lesion in this region although there is some questionable punctate restriction of diffusion on image 14 series 7 which is too small to status factor Deatra Canter characterize. No lesion was appreciable on 03/31/2020. Gallbladder unremarkable.  No biliary dilatation. Pancreas: No abnormal pancreatic cystic lesion currently identified. No pancreatic or peripancreatic edema to indicate current acute pancreatitis. No abnormal hypoenhancing regions of the pancreas. No pancreatic mass is observed. No pancreas divisum. Spleen:  Unremarkable.  Small accessory spleen noted. Adrenals/Urinary Tract: Very small peripheral renal cysts. Adrenal glands unremarkable. Stomach/Bowel: Unremarkable Vascular/Lymphatic: Atherosclerosis is present, including aortoiliac atherosclerotic disease. No pathologic upper abdominal adenopathy. Other: Suspected trace ascites along the inferior tip of the liver, cause uncertain. Musculoskeletal: Unremarkable IMPRESSION: 1. Previously seen focus of arterial phase enhancement in segment 8 of the liver is no longer visualized, and was not visible on 03/31/2020. This raises the likelihood this may have simply represented focal to benign transient hepatic attenuation difference. Questionable speck of restriction of diffusion in this vicinity on today's  exam, but no  lesion visible on any other sequence. 2. Currently unremarkable appearance of the pancreas. 3. Trace ascites along the inferior posterior edge of the right hepatic lobe, cause uncertain. 4. Chronic lower abdominal aortic atherosclerosis. Electronically Signed   By: Van Clines M.D.   On: 12/18/2020 08:03    Procedures Procedures   Medications Ordered in ED Medications  cefTRIAXone (ROCEPHIN) 2 g in sodium chloride 0.9 % 100 mL IVPB (has no administration in time range)  ketorolac (TORADOL) 30 MG/ML injection 30 mg (has no administration in time range)  lactated ringers bolus 2,000 mL (2,000 mLs Intravenous New Bag/Given 12/18/20 1220)  ondansetron (ZOFRAN) injection 4 mg (4 mg Intravenous Given 12/18/20 1221)  morphine 4 MG/ML injection 4 mg (4 mg Intravenous Given 12/18/20 1222)    ED Course  I have reviewed the triage vital signs and the nursing notes.  Pertinent labs & imaging results that were available during my care of the patient were reviewed by me and considered in my medical decision making (see chart for details).  Clinical Course as of 12/18/20 1436  Tue Dec 18, 2020  1145 Labs notable for elevated white blood cell count at 19.  Metabolic panel normal.  Troponin normal.  Lipase normal [JK]  1145 Chest x-ray shows mild atelectasis. [JK]  1343 Urinalysis is consistent with acute infection. [JK]    Clinical Course User Index [JK] Dorie Rank, MD   MDM Rules/Calculators/A&P                           Patient presented to the emergency room for evaluation of abdominal pain.  Patient actually had a recent outpatient MRI performed yesterday.  This was of the abdomen and no acute findings were noted.  Patient does have notable elevation of white blood cell count today.  She is complaining of flank pain and diffuse abdominal pain.  Urinalysis did show urinary tract infection.  Symptoms are suggestive of pyelonephritis with her flank and abdominal pain.  Patient was treated  with IV fluids pain medications and antiemetics.  No vomiting here.  I think she is a candidate for outpatient management and close treatment.    Final Clinical Impression(s) / ED Diagnoses Final diagnoses:  Pyelonephritis    Rx / DC Orders ED Discharge Orders          Ordered    cefUROXime (CEFTIN) 500 MG tablet  2 times daily with meals        12/18/20 1432    ondansetron (ZOFRAN) 4 MG tablet  Every 6 hours        12/18/20 1432    HYDROcodone-acetaminophen (NORCO/VICODIN) 5-325 MG tablet  Every 6 hours PRN        12/18/20 1432    naproxen (NAPROSYN) 375 MG tablet  2 times daily        12/18/20 1432             Dorie Rank, MD 12/18/20 1436

## 2020-12-18 NOTE — ED Triage Notes (Signed)
Pt reports with chest pain, abdominal pain, and fever that started yesterday.

## 2020-12-18 NOTE — Discharge Instructions (Addendum)
Take the medications as prescribed.  Follow-up with your doctor to make sure the kidney/bladder infection clears.  Return to the ER for fever or worsening symptoms

## 2020-12-19 ENCOUNTER — Encounter (HOSPITAL_BASED_OUTPATIENT_CLINIC_OR_DEPARTMENT_OTHER): Payer: Medicaid Other | Admitting: Obstetrics & Gynecology

## 2020-12-27 ENCOUNTER — Ambulatory Visit (INDEPENDENT_AMBULATORY_CARE_PROVIDER_SITE_OTHER): Payer: Medicaid Other | Admitting: Obstetrics & Gynecology

## 2020-12-27 ENCOUNTER — Other Ambulatory Visit: Payer: Self-pay

## 2020-12-27 ENCOUNTER — Other Ambulatory Visit (HOSPITAL_COMMUNITY)
Admission: RE | Admit: 2020-12-27 | Discharge: 2020-12-27 | Disposition: A | Discharge status: Home / Self Care | Payer: Medicaid Other | Source: Ambulatory Visit | Attending: Obstetrics & Gynecology | Admitting: Obstetrics & Gynecology

## 2020-12-27 ENCOUNTER — Encounter (HOSPITAL_BASED_OUTPATIENT_CLINIC_OR_DEPARTMENT_OTHER): Payer: Self-pay | Admitting: Obstetrics & Gynecology

## 2020-12-27 VITALS — BP 114/83 | Ht 71.0 in | Wt 138.4 lb

## 2020-12-27 DIAGNOSIS — N898 Other specified noninflammatory disorders of vagina: Secondary | ICD-10-CM | POA: Diagnosis not present

## 2020-12-27 DIAGNOSIS — N12 Tubulo-interstitial nephritis, not specified as acute or chronic: Secondary | ICD-10-CM | POA: Diagnosis not present

## 2020-12-27 MED ORDER — SULFAMETHOXAZOLE-TRIMETHOPRIM 800-160 MG PO TABS: 1.0000 | ORAL_TABLET | Freq: Two times a day (BID) | ORAL | 0 refills | Status: DC

## 2020-12-27 NOTE — Progress Notes (Signed)
GYNECOLOGY  VISIT  CC:   post op, ER follow up  HPI: 50 y.o. [id number] Single Black or African American female here for recheck after pt has vaginal cuff dehiscence due to early intercourse.  Is having some vaginal discharge.  Would like assessment for BV.  Pt has recently been to ER and was diagnosed with pyelonephritis.  Prior to going, pt was having dysuria, lower pelvic pressure and left flank pain.  Note and labs reviewed.  Pt had white count of 19K.  Was treated with single dose of ceftriaxone IV and then sent home with ceftin 500mg  bid x 10 days.  Symptoms started really worsened after having MRI for follow up of liver lesion.  She is some better but still doesn't feel great.  Not having fevers.  Urine culture in ER was not completed.  GYNECOLOGIC HISTORY: Patient's last menstrual period was 03/12/2020. Contraception: hysterectomy  Patient Active Problem List   Diagnosis Date Noted   Vaginal cuff dehiscence, initial encounter 09/21/2020   Pancreatitis 03/31/2020   Current moderate episode of major depressive disorder without prior episode ([place]) 11/23/2017   Nephrolithiasis 11/23/2017   Cervical spinal stenosis 02/11/2017   Spondylosis of cervical region without myelopathy or radiculopathy 11/04/2016   Essential hypertension 07/01/2016   Tobacco dependence 07/01/2016   ASCUS with positive high risk HPV cervical pap smear 12/02/2013    Past Medical History:  Diagnosis Date   History of seizures as a child    father dropped pt as a baby. Hasnt had a seizure in over 30 years.   Hypertension    Pancreatitis     Past Surgical History:  Procedure Laterality Date   CYSTOSCOPY N/A 08/01/2020   Procedure: CYSTOSCOPY;  Surgeon: Megan Salon, MD;  Location: [facility];  Service: Gynecology;  Laterality: N/A;   HERNIA REPAIR  1975   LEEP     years ago   NECK SURGERY  2017   with right shoulder surgery   TOTAL LAPAROSCOPIC HYSTERECTOMY WITH BILATERAL SALPINGO OOPHORECTOMY Bilateral  08/01/2020   Procedure: TOTAL LAPAROSCOPIC HYSTERECTOMY WITH BILATERAL SALPINGO OOPHORECTOMY;  Surgeon: Megan Salon, MD;  Location: [facility];  Service: Gynecology;  Laterality: Bilateral;    MEDS:   Current Outpatient Medications on File Prior to Visit  Medication Sig Dispense Refill   acetaminophen (TYLENOL) 500 MG tablet Take 1 tablet (500 mg total) by mouth every 6 (six) hours as needed (pain). 60 tablet 0   cefUROXime (CEFTIN) 500 MG tablet Take 1 tablet (500 mg total) by mouth 2 (two) times daily with a meal for 10 days. 20 tablet 0   chlorthalidone (HYGROTON) 25 MG tablet Take 25 mg by mouth daily.     famotidine (PEPCID) 20 MG tablet Take 20 mg by mouth daily as needed for heartburn or indigestion.     gabapentin (NEURONTIN) 100 MG capsule Take 1 capsule (100 mg total) by mouth 3 (three) times daily. 90 capsule 3   HYDROcodone-acetaminophen (NORCO/VICODIN) 5-325 MG tablet Take 1 tablet by mouth every 6 (six) hours as needed. 12 tablet 0   naproxen (NAPROSYN) 375 MG tablet Take 1 tablet (375 mg total) by mouth 2 (two) times daily. 20 tablet 0   ondansetron (ZOFRAN) 4 MG tablet Take 1 tablet (4 mg total) by mouth every 6 (six) hours. 12 tablet 0   No current facility-administered medications on file prior to visit.    ALLERGIES: Carbomer  Family History  Problem Relation Age of Onset   Lupus Mother  Pancreatic cancer Father     SH:  single, non smoker  Review of Systems  Constitutional:  Positive for malaise/fatigue. Negative for chills and fever.  Eyes:  Positive for pain.  Respiratory: Negative.    Cardiovascular: Negative.   Genitourinary: Negative.        Vaginal discharge   PHYSICAL EXAMINATION:    BP 114/83 (BP Location: Right Arm, Patient Position: Sitting, Cuff Size: Normal)   Ht 5\' 11"  (1.803 m) Comment: reported  Wt 138 lb 6.4 oz (62.8 kg)   LMP 03/12/2020   BMI 19.30 kg/m     General appearance: alert, cooperative and appears stated age CV:  Regular rate  and rhythm Lungs:  clear to auscultation, no wheezes, rales or rhonchi, symmetric air entry Flank:  no CVA tenderness Abdomen: soft, non-tender; bowel sounds normal; no masses,  no organomegaly Lymph:  no inguinal LAD noted  Pelvic: External genitalia:  no lesions              Urethra:  normal appearing urethra with no masses, tenderness or lesions              Bartholins and Skenes: normal                 Vagina: normal appearing vagina with normal color and mild whitish discharge noted,  cuff well approximated now, no lesions              Cervix: absent              Bimanual Exam:  Uterus:  uterus absent              Adnexa: no mass, fullness, tenderness  Chaperone, Octaviano Batty, CMA, was present for exam.  Assessment/Plan: 1. Pyelonephritis - change abx to bactrim DS BID x 7 days - Urine Culture - CBC will be obtained to recheck WBC ct  2. Vaginal odor - ancillary swab obtained for BV and yeast

## 2020-12-28 LAB — CBC
Hematocrit: 38.5 % (ref 34.0–46.6)
Hemoglobin: 12.5 g/dL (ref 11.1–15.9)
MCH: 27.5 pg (ref 26.6–33.0)
MCHC: 32.5 g/dL (ref 31.5–35.7)
MCV: 85 fL (ref 79–97)
Platelets: 395 10*3/uL (ref 150–450)
RBC: 4.54 x10E6/uL (ref 3.77–5.28)
RDW: 14.3 % (ref 11.7–15.4)
WBC: 5.9 10*3/uL (ref 3.4–10.8)

## 2020-12-31 LAB — CERVICOVAGINAL ANCILLARY ONLY
Bacterial Vaginitis (gardnerella): POSITIVE — AB
Candida Glabrata: NEGATIVE
Candida Vaginitis: NEGATIVE
Comment: NEGATIVE
Comment: NEGATIVE
Comment: NEGATIVE

## 2020-12-31 LAB — URINE CULTURE

## 2021-01-01 ENCOUNTER — Other Ambulatory Visit (HOSPITAL_BASED_OUTPATIENT_CLINIC_OR_DEPARTMENT_OTHER): Payer: Self-pay | Admitting: Obstetrics & Gynecology

## 2021-01-01 MED ORDER — METRONIDAZOLE 500 MG PO TABS: 500.0000 mg | ORAL_TABLET | Freq: Two times a day (BID) | ORAL | 0 refills | Status: DC

## 2021-01-23 ENCOUNTER — Encounter (HOSPITAL_BASED_OUTPATIENT_CLINIC_OR_DEPARTMENT_OTHER): Payer: Self-pay | Admitting: Obstetrics & Gynecology

## 2021-01-24 ENCOUNTER — Other Ambulatory Visit (HOSPITAL_BASED_OUTPATIENT_CLINIC_OR_DEPARTMENT_OTHER): Payer: Self-pay | Admitting: *Deleted

## 2021-01-24 MED ORDER — FLUCONAZOLE 150 MG PO TABS: 150.0000 mg | ORAL_TABLET | Freq: Once | ORAL | 0 refills | Status: AC

## 2021-01-24 NOTE — Progress Notes (Signed)
Rx sent for diflucan for yeast symptoms

## 2021-04-04 ENCOUNTER — Telehealth (HOSPITAL_BASED_OUTPATIENT_CLINIC_OR_DEPARTMENT_OTHER): Payer: Self-pay | Admitting: Obstetrics & Gynecology

## 2021-04-04 NOTE — Telephone Encounter (Signed)
Patient called and stated that she is having abdominal pain. ?

## 2021-04-11 ENCOUNTER — Encounter (HOSPITAL_BASED_OUTPATIENT_CLINIC_OR_DEPARTMENT_OTHER): Payer: Self-pay | Admitting: Obstetrics & Gynecology

## 2021-04-11 ENCOUNTER — Other Ambulatory Visit: Payer: Self-pay

## 2021-04-11 ENCOUNTER — Ambulatory Visit (INDEPENDENT_AMBULATORY_CARE_PROVIDER_SITE_OTHER): Payer: Medicaid Other | Admitting: Obstetrics & Gynecology

## 2021-04-11 ENCOUNTER — Other Ambulatory Visit (HOSPITAL_COMMUNITY)
Admission: RE | Admit: 2021-04-11 | Discharge: 2021-04-11 | Disposition: A | Discharge status: Home / Self Care | Payer: Medicaid Other | Source: Ambulatory Visit | Attending: Obstetrics & Gynecology | Admitting: Obstetrics & Gynecology

## 2021-04-11 VITALS — BP 151/94 | HR 63 | Ht 71.0 in | Wt 145.2 lb

## 2021-04-11 DIAGNOSIS — R1030 Lower abdominal pain, unspecified: Secondary | ICD-10-CM

## 2021-04-11 DIAGNOSIS — R1031 Right lower quadrant pain: Secondary | ICD-10-CM | POA: Diagnosis not present

## 2021-04-11 DIAGNOSIS — N898 Other specified noninflammatory disorders of vagina: Secondary | ICD-10-CM | POA: Insufficient documentation

## 2021-04-11 LAB — POCT URINALYSIS DIPSTICK
Bilirubin, UA: NEGATIVE
Glucose, UA: NEGATIVE
Ketones, UA: NEGATIVE
Leukocytes, UA: NEGATIVE
Nitrite, UA: NEGATIVE
Protein, UA: POSITIVE — AB
Spec Grav, UA: >=1.03 — AB (ref 1.010–1.025)
Urobilinogen, UA: 0.2 E.U./dL
pH, UA: 5 (ref 5.0–8.0)

## 2021-04-11 MED ORDER — SULFAMETHOXAZOLE-TRIMETHOPRIM 800-160 MG PO TABS: 1.0000 | ORAL_TABLET | Freq: Two times a day (BID) | ORAL | 0 refills | Status: DC

## 2021-04-12 ENCOUNTER — Other Ambulatory Visit: Payer: Self-pay | Admitting: Obstetrics and Gynecology

## 2021-04-12 DIAGNOSIS — B9689 Other specified bacterial agents as the cause of diseases classified elsewhere: Secondary | ICD-10-CM

## 2021-04-12 LAB — CERVICOVAGINAL ANCILLARY ONLY
Bacterial Vaginitis (gardnerella): POSITIVE — AB
Candida Glabrata: NEGATIVE
Candida Vaginitis: NEGATIVE
Comment: NEGATIVE
Comment: NEGATIVE
Comment: NEGATIVE

## 2021-04-12 MED ORDER — METRONIDAZOLE 500 MG PO TABS: 500.0000 mg | ORAL_TABLET | Freq: Two times a day (BID) | ORAL | 0 refills | Status: AC

## 2021-04-13 LAB — URINE CULTURE: Organism ID, Bacteria: NO GROWTH

## 2021-04-14 ENCOUNTER — Encounter (HOSPITAL_BASED_OUTPATIENT_CLINIC_OR_DEPARTMENT_OTHER): Payer: Self-pay | Admitting: Obstetrics & Gynecology

## 2021-04-14 NOTE — Progress Notes (Signed)
GYNECOLOGY  VISIT ? ?CC:   RLQ pain ? ?HPI: ?51 y.o. [id number] Single Black or African American female here for complaint of RLQ pain that seem to have started earlier this week.  Is having a little dysuria.  No fever.  No back pain.  Mild vaginal discharge.  Has not been SA.  Was worried and wanted to be seen.. ? ?GYNECOLOGIC HISTORY: ?Patient's last menstrual period was 03/12/2020. ?Contraception: hysterectomy ?Menopausal hormone therapy: none ? ?Patient Active Problem List  ? Diagnosis Date Noted  ? Vaginal cuff dehiscence, initial encounter 09/21/2020  ? Pancreatitis 03/31/2020  ? Current moderate episode of major depressive disorder without prior episode ([place]) 11/23/2017  ? Nephrolithiasis 11/23/2017  ? Cervical spinal stenosis 02/11/2017  ? Spondylosis of cervical region without myelopathy or radiculopathy 11/04/2016  ? Essential hypertension 07/01/2016  ? Tobacco dependence 07/01/2016  ? ASCUS with positive high risk HPV cervical pap smear 12/02/2013  ? ? ?Past Medical History:  ?Diagnosis Date  ? History of seizures as a child   ? father dropped pt as a baby. Hasnt had a seizure in over 30 years.  ? Hypertension   ? Pancreatitis   ? ? ?Past Surgical History:  ?Procedure Laterality Date  ? CYSTOSCOPY N/A 08/01/2020  ? Procedure: CYSTOSCOPY;  Surgeon: Megan Salon, MD;  Location: [facility];  Service: Gynecology;  Laterality: N/A;  ? HERNIA REPAIR  1975  ? LEEP    ? years ago  ? NECK SURGERY  2017  ? with right shoulder surgery  ? TOTAL LAPAROSCOPIC HYSTERECTOMY WITH BILATERAL SALPINGO OOPHORECTOMY Bilateral 08/01/2020  ? Procedure: TOTAL LAPAROSCOPIC HYSTERECTOMY WITH BILATERAL SALPINGO OOPHORECTOMY;  Surgeon: Megan Salon, MD;  Location: [facility];  Service: Gynecology;  Laterality: Bilateral;  ? ? ?MEDS:   ?Current Outpatient Medications on File Prior to Visit  ?Medication Sig Dispense Refill  ? gabapentin (NEURONTIN) 100 MG capsule Take 1 capsule (100 mg total) by mouth 3 (three) times daily. 90 capsule 3  ?  chlorthalidone (HYGROTON) 25 MG tablet Take 25 mg by mouth daily. (Patient not taking: Reported on 04/11/2021)    ? famotidine (PEPCID) 20 MG tablet Take 20 mg by mouth daily as needed for heartburn or indigestion. (Patient not taking: Reported on 04/11/2021)    ? ?No current facility-administered medications on file prior to visit.  ? ? ?ALLERGIES: Carbomer ? ?Family History  ?Problem Relation Age of Onset  ? Lupus Mother   ? Pancreatic cancer Father   ? ? ?SH:  single, smoker ? ?Review of Systems  ?Respiratory: Negative.    ?Cardiovascular: Negative.   ?Gastrointestinal:  Positive for abdominal pain (rlq). Negative for constipation, diarrhea, nausea and vomiting.  ?Genitourinary: Negative.   ? ?PHYSICAL EXAMINATION:   ? ?BP (!) 151/94 (BP Location: Right Arm, Patient Position: Sitting, Cuff Size: Normal)   Pulse 63   Ht '5\' 11"'$  (1.803 m)   Wt 145 lb 3.2 oz (65.9 kg)   LMP 03/12/2020   BMI 20.25 kg/m?     ?General appearance: alert, cooperative and appears stated age ?Abdomen: soft, mild RLQ pain to deep palpation; bowel sounds normal; no masses,  no organomegaly ?Lymph:  no inguinal LAD noted ? ?Pelvic: External genitalia:  no lesions ?             Urethra:  normal appearing urethra with no masses, tenderness or lesions ?             Bartholins and Skenes: normal    ?  Vagina: normal appearing vagina with normal color and discharge, no lesions ?             Cervix: absent ?             Bimanual Exam:  Uterus:  uterus absent ?             Adnexa: no mass, fullness, tenderness ? ?Chaperone, Bethann Goo, CMA, was present for exam. ? ?Assessment/Plan: ?1. Lower abdominal pain ?- POCT Urinalysis Dipstick ?- due to dip urine, will treat with bactrim DS BID x 5 days ? ?2. RLQ abdominal pain ?- Urine Culture ? ?3. Vaginal discharge ?- Cervicovaginal ancillary only( [place]) ? ? ? ?

## 2021-04-18 ENCOUNTER — Telehealth (HOSPITAL_BASED_OUTPATIENT_CLINIC_OR_DEPARTMENT_OTHER): Payer: Self-pay | Admitting: Obstetrics & Gynecology

## 2021-04-18 NOTE — Telephone Encounter (Signed)
Patient called and would like for nurse to call her and tell the name of the over-the-counter cream.  ?Patient stated the Dr. Sabra Heck told her about the cream at her last visited. ?

## 2021-05-14 NOTE — Telephone Encounter (Signed)
Called pt to clarify which OTC cream she was needing. Pt states that it wasn't a cream. Dr. Sabra Heck recommended that the pt use OTC boric acid suppositories to see if that will help with recurrent BV.  ?

## 2021-06-26 ENCOUNTER — Encounter (HOSPITAL_BASED_OUTPATIENT_CLINIC_OR_DEPARTMENT_OTHER): Payer: Self-pay | Admitting: Obstetrics & Gynecology

## 2021-06-26 NOTE — Telephone Encounter (Signed)
Called pt and advised that she should be evaluated by a provider. Appt provided

## 2021-06-27 ENCOUNTER — Telehealth: Payer: Medicaid Other | Admitting: Physician Assistant

## 2021-06-27 ENCOUNTER — Other Ambulatory Visit (HOSPITAL_BASED_OUTPATIENT_CLINIC_OR_DEPARTMENT_OTHER): Payer: Self-pay | Admitting: Obstetrics & Gynecology

## 2021-06-27 ENCOUNTER — Telehealth (HOSPITAL_BASED_OUTPATIENT_CLINIC_OR_DEPARTMENT_OTHER): Payer: Self-pay | Admitting: *Deleted

## 2021-06-27 DIAGNOSIS — R109 Unspecified abdominal pain: Secondary | ICD-10-CM | POA: Diagnosis not present

## 2021-06-27 NOTE — Telephone Encounter (Signed)
Attempted to return pts call. Voicemail left to call the office back

## 2021-06-27 NOTE — Progress Notes (Signed)
Because of belly pain that per your message to your GYN seems to be significant and possibly ongoing, and recent kidney issues noted in your questionnaire, I feel your condition warrants further evaluation and I recommend that you be seen in a face to face visit.   NOTE: There will be NO CHARGE for this eVisit   If you are having a true medical emergency please call 911.      For an urgent face to face visit, [place] has six urgent care centers for your convenience:     [facility] [place] Get Driving Directions [phone] [address] [place], [place] [address]    [facility]) Get Driving Directions [phone] [place], [place] [address]  [facility] [place] ([place]) Get Driving Directions [phone] [address] [place],  [place]  [address]  [facility]  Get Driving Directions [phone] [address] [place], [place], [place] [address]   [facility] Get Driving Directions  [phone] [address] [place], [place] [address]   [facility] Get Driving Directions [phone] [address], [place], [place] [address]  Your MyChart E-visit questionnaire answers were reviewed by a board certified advanced clinical practitioner to complete your personal care plan based on your specific symptoms.  Thank you for using e-Visits.

## 2021-07-01 ENCOUNTER — Other Ambulatory Visit (HOSPITAL_COMMUNITY)
Admission: RE | Admit: 2021-07-01 | Discharge: 2021-07-01 | Disposition: A | Discharge status: Home / Self Care | Payer: Medicaid Other | Source: Ambulatory Visit | Attending: Obstetrics & Gynecology | Admitting: Obstetrics & Gynecology

## 2021-07-01 ENCOUNTER — Encounter (HOSPITAL_BASED_OUTPATIENT_CLINIC_OR_DEPARTMENT_OTHER): Payer: Self-pay | Admitting: Obstetrics & Gynecology

## 2021-07-01 ENCOUNTER — Ambulatory Visit (INDEPENDENT_AMBULATORY_CARE_PROVIDER_SITE_OTHER): Payer: Medicaid Other | Admitting: Obstetrics & Gynecology

## 2021-07-01 VITALS — BP 138/91 | HR 62 | Ht 71.0 in | Wt 136.6 lb

## 2021-07-01 DIAGNOSIS — R319 Hematuria, unspecified: Secondary | ICD-10-CM

## 2021-07-01 DIAGNOSIS — Z1231 Encounter for screening mammogram for malignant neoplasm of breast: Secondary | ICD-10-CM

## 2021-07-01 DIAGNOSIS — R87810 Cervical high risk human papillomavirus (HPV) DNA test positive: Secondary | ICD-10-CM | POA: Insufficient documentation

## 2021-07-01 DIAGNOSIS — R3129 Other microscopic hematuria: Secondary | ICD-10-CM

## 2021-07-01 DIAGNOSIS — R87610 Atypical squamous cells of undetermined significance on cytologic smear of cervix (ASC-US): Secondary | ICD-10-CM

## 2021-07-01 DIAGNOSIS — R140 Abdominal distension (gaseous): Secondary | ICD-10-CM

## 2021-07-01 DIAGNOSIS — Z87440 Personal history of urinary (tract) infections: Secondary | ICD-10-CM

## 2021-07-01 LAB — POCT URINALYSIS DIPSTICK
Appearance: NORMAL
Bilirubin, UA: NEGATIVE
Glucose, UA: NEGATIVE
Ketones, UA: NEGATIVE
Leukocytes, UA: NEGATIVE
Nitrite, UA: NEGATIVE
Protein, UA: NEGATIVE
Spec Grav, UA: 1.025 (ref 1.010–1.025)
Urobilinogen, UA: 0.2 E.U./dL
pH, UA: 5.5 (ref 5.0–8.0)

## 2021-07-01 NOTE — Progress Notes (Signed)
GYNECOLOGY  VISIT  CC:   RLQ  HPI: 51 y.o. [id number] Single Black or African American female here for complaint of persistent RLQ that seems present for several months.  Denies vaginal bleeding bleeding. Reports after eating, she are noticing increased bloating that is low in the abdomen and typically on the right side.  She does have intermittent associated nausea with the pain as well.  Does not typically have emesis.  Does not have constipation or diarrhea.  She has lost some weight since she was here last time.  Was 145 and is now 136.  Pt feels this is due to avoiding foods.  Cannot determine if bloating and pain is associated with any particular type of foods.    Last week, she also had some incontinence that was spontaneous but seemed associated with the lower quadrant pain and bloating.  Did not have dysuria.  This has resolved.    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  This was last July.  Pt does have hx of ASCUS pap with +HR HPV.  Has not had regular pap smears.  Pap prior to surgery was normal.  I did recommend repeating about one year after surgery.  Will plan to do this today.  Not currently SA.  Denies vaginal discharge or odor.   Past Medical History:  Diagnosis Date   History of abnormal cervical Pap smear 2015   ASCUS with +HR HPV   History of seizures as a child    father dropped pt as a baby. Hasnt had a seizure in over 30 years.   Hypertension    Pancreatitis     MEDS:   Current Outpatient Medications on File Prior to Visit  Medication Sig Dispense Refill   gabapentin (NEURONTIN) 100 MG capsule Take 1 capsule (100 mg total) by mouth 3 (three) times daily. 90 capsule 3   No current facility-administered medications on file prior to visit.    ALLERGIES: Carbomer  SH:  single, smoker  Review of Systems  Constitutional:  Positive for weight loss.  Respiratory: Negative.    Cardiovascular: Negative.   Gastrointestinal:  Positive for abdominal pain and nausea.     PHYSICAL EXAMINATION:    BP (!) 138/91   Pulse 62   Ht '5\' 11"'$  (1.803 m)   Wt 136 lb 9.6 oz (62 kg)   LMP 03/12/2020   BMI 19.05 kg/m     General appearance: alert, cooperative and appears stated age CV:  Regular rate and rhythm Lungs:  clear to auscultation, no wheezes, rales or rhonchi, symmetric air entry Abdomen: soft, RLQ tenderness to deep palpation, no rebound or guarding; bowel sounds normal; no masses,  no organomegaly Lymph:  no inguinal LAD noted  Pelvic: External genitalia:  no lesions              Urethra:  normal appearing urethra with no masses, tenderness or lesions              Bartholins and Skenes: normal                 Vagina: normal appearing vagina with normal color and discharge, no lesions              Cervix: absent              Bimanual Exam:  Uterus:  uterus absent              Adnexa: no mass, fullness, tenderness  Chaperone, Ezekiel Ina,  RN, was present for exam.  Assessment/Plan: 1. Bloating - with hx of TLH and RSO, I just don't think this is gyn related.  As well, the associated bloating and nausea feels like this is more GI in origin.  Pt has never had colonoscopy so reasonable to proceed with this as well.  Referral placed. - Ambulatory referral to Gastroenterology  2. History of UTI - POCT urinalysis dipstick - +3 microscopic hematuria noted today.  Urine culture obtained.  Doubtful this is a UTI.   - referral to urology placed.   3. Microscopic hematuria - Ambulatory referral to Urology  4. ASCUS with positive high risk HPV cervical - Cytology - PAP( [place])  5. Encounter for screening mammogram for malignant neoplasm of breast - pt needs to have this updated as well. - MM 3D SCREEN BREAST BILATERAL; Future

## 2021-07-02 LAB — URINE CULTURE

## 2021-07-03 LAB — CYTOLOGY - PAP
Adequacy: ABSENT
Comment: NEGATIVE
Diagnosis: NEGATIVE
High risk HPV: NEGATIVE

## 2021-07-07 ENCOUNTER — Inpatient Hospital Stay (HOSPITAL_BASED_OUTPATIENT_CLINIC_OR_DEPARTMENT_OTHER): Admission: RE | Admit: 2021-07-07 | Payer: Medicaid Other | Source: Ambulatory Visit | Admitting: Radiology

## 2021-07-11 ENCOUNTER — Ambulatory Visit (HOSPITAL_BASED_OUTPATIENT_CLINIC_OR_DEPARTMENT_OTHER): Payer: Medicaid Other | Admitting: Obstetrics & Gynecology

## 2021-07-14 ENCOUNTER — Ambulatory Visit (HOSPITAL_BASED_OUTPATIENT_CLINIC_OR_DEPARTMENT_OTHER)
Admission: RE | Admit: 2021-07-14 | Discharge: 2021-07-14 | Disposition: A | Discharge status: Home / Self Care | Payer: Medicaid Other | Source: Ambulatory Visit | Attending: Obstetrics & Gynecology | Admitting: Obstetrics & Gynecology

## 2021-07-14 DIAGNOSIS — Z1231 Encounter for screening mammogram for malignant neoplasm of breast: Secondary | ICD-10-CM | POA: Diagnosis not present

## 2021-07-31 ENCOUNTER — Telehealth: Payer: Medicaid Other | Admitting: Physician Assistant

## 2021-07-31 DIAGNOSIS — B9689 Other specified bacterial agents as the cause of diseases classified elsewhere: Secondary | ICD-10-CM | POA: Diagnosis not present

## 2021-07-31 DIAGNOSIS — N760 Acute vaginitis: Secondary | ICD-10-CM | POA: Diagnosis not present

## 2021-07-31 MED ORDER — METRONIDAZOLE 500 MG PO TABS: 500.0000 mg | ORAL_TABLET | Freq: Two times a day (BID) | ORAL | 0 refills | Status: AC

## 2021-07-31 NOTE — Progress Notes (Signed)
Virtual Visit Consent   Mary Ramos, you are scheduled for a virtual visit with a [facility] provider today. Just as with appointments in the office, your consent must be obtained to participate. Your consent will be active for this visit and any virtual visit you may have with one of our providers in the next 365 days. If you have a MyChart account, a copy of this consent can be sent to you electronically.  As this is a virtual visit, video technology does not allow for your provider to perform a traditional examination. This may limit your provider's ability to fully assess your condition. If your provider identifies any concerns that need to be evaluated in person or the need to arrange testing (such as labs, EKG, etc.), we will make arrangements to do so. Although advances in technology are sophisticated, we cannot ensure that it will always work on either your end or our end. If the connection with a video visit is poor, the visit may have to be switched to a telephone visit. With either a video or telephone visit, we are not always able to ensure that we have a secure connection.  By engaging in this virtual visit, you consent to the provision of healthcare and authorize for your insurance to be billed (if applicable) for the services provided during this visit. Depending on your insurance coverage, you may receive a charge related to this service.  I need to obtain your verbal consent now. Are you willing to proceed with your visit today? Mary Ramos has provided verbal consent on 07/31/2021 for a virtual visit (video or telephone). Mar Daring, PA-C  Date: 07/31/2021 5:29 PM  Virtual Visit via Video Note   I, Mar Daring, connected with  Mary Ramos  ([id number], 05/10/1970) on 07/31/21 at  5:30 PM EDT by a video-enabled telemedicine application and verified that I am speaking with the correct person using two identifiers.  Location: Patient: Virtual Visit Location  Patient: Home Provider: Virtual Visit Location Provider: Home Office   I discussed the limitations of evaluation and management by telemedicine and the availability of in person appointments. The patient expressed understanding and agreed to proceed.    History of Present Illness: Mary Ramos is a 51 y.o. who identifies as a female who was assigned female at birth, and is being seen today for vaginal discharge.  HPI: Vaginal Discharge The patient's primary symptoms include a genital odor and vaginal discharge. The patient's pertinent negatives include no genital itching or genital lesions. This is a new problem. The current episode started in the past 7 days (2 days ago). The problem occurs constantly. The problem has been gradually worsening. The patient is experiencing no pain. Pertinent negatives include no abdominal pain, back pain, dysuria, fever, flank pain, frequency or nausea. The vaginal discharge was thin, white and malodorous. Nothing aggravates the symptoms. She has tried nothing for the symptoms. The treatment provided no relief. She is not sexually active. Her past medical history is significant for a gynecological surgery (hysterectomy) and vaginosis.      Problems:  Patient Active Problem List   Diagnosis Date Noted   Pancreatitis 03/31/2020   Current moderate episode of major depressive disorder without prior episode ([place]) 11/23/2017   Nephrolithiasis 11/23/2017   Cervical spinal stenosis 02/11/2017   Spondylosis of cervical region without myelopathy or radiculopathy 11/04/2016   Essential hypertension 07/01/2016   Tobacco dependence 07/01/2016    Allergies:  Allergies  Allergen Reactions  Carbomer Hives    Nickel    Medications:  Current Outpatient Medications:    metroNIDAZOLE (FLAGYL) 500 MG tablet, Take 1 tablet (500 mg total) by mouth 2 (two) times daily for 7 days., Disp: 14 tablet, Rfl: 0   gabapentin (NEURONTIN) 100 MG capsule, Take 1 capsule (100 mg  total) by mouth 3 (three) times daily., Disp: 90 capsule, Rfl: 3  Observations/Objective: Patient is well-developed, well-nourished in no acute distress.  Resting comfortably at home.  Head is normocephalic, atraumatic.  No labored breathing.  Speech is clear and coherent with logical content.  Patient is alert and oriented at baseline.    Assessment and Plan: 1. BV (bacterial vaginosis) - metroNIDAZOLE (FLAGYL) 500 MG tablet; Take 1 tablet (500 mg total) by mouth 2 (two) times daily for 7 days.  Dispense: 14 tablet; Refill: 0  - Suspect BV, does have a history - Had recent change in laundry detergent - Metronidazole prescribed - Avoid bubble baths, scented soaps/lotions - Free and clear laundry detergent - Vaginal washes and probiotics sent via AVS - Seek in person evaluation if symptoms worsen or fail to resolve with treatment  Follow Up Instructions: I discussed the assessment and treatment plan with the patient. The patient was provided an opportunity to ask questions and all were answered. The patient agreed with the plan and demonstrated an understanding of the instructions.  A copy of instructions were sent to the patient via MyChart unless otherwise noted below.    The patient was advised to call back or seek an in-person evaluation if the symptoms worsen or if the condition fails to improve as anticipated.  Time:  I spent 12 minutes with the patient via telehealth technology discussing the above problems/concerns.    Mar Daring, PA-C

## 2021-07-31 NOTE — Patient Instructions (Addendum)
Mary Ramos, thank you for joining Mar Daring, PA-C for today's virtual visit.  While this provider is not your primary care provider (PCP), if your PCP is located in our provider database this encounter information will be shared with them immediately following your visit.  Consent: (Patient) Mary Ramos provided verbal consent for this virtual visit at the beginning of the encounter.  Current Medications:  Current Outpatient Medications:    gabapentin (NEURONTIN) 100 MG capsule, Take 1 capsule (100 mg total) by mouth 3 (three) times daily., Disp: 90 capsule, Rfl: 3   Medications ordered in this encounter:  No orders of the defined types were placed in this encounter.    *If you need refills on other medications prior to your next appointment, please contact your pharmacy*  Follow-Up: Call back or seek an in-person evaluation if the symptoms worsen or if the condition fails to improve as anticipated.  Other Instructions Vaginal Probiotics: AZO vaginal probiotic OLLY Happy Hoo-Ha RAW Vaginal Care RenewLife Women's vaginal probiotic RepHresh Pro-B  Vaginal washes: Honey Pot Summer's Eve Vagisil Feminine cleanser   Bacterial Vaginosis  Bacterial vaginosis is an infection of the vagina. It happens when too many normal germs (healthy bacteria) grow in the vagina. This infection can make it easier to get other infections from sex (STIs). It is very important for pregnant women to get treated. This infection can cause babies to be born early or at a low birth weight. What are the causes? This infection is caused by an increase in certain germs that grow in the vagina. You cannot get this infection from toilet seats, bedsheets, swimming pools, or things that touch your vagina. What increases the risk? Having sex with a new person or more than one person. Having sex without protection. Douching. Having an intrauterine device (IUD). Smoking. Using drugs or  drinking alcohol. These can lead you to do things that are risky. Taking certain antibiotic medicines. Being pregnant. What are the signs or symptoms? Some women have no symptoms. Symptoms may include: A discharge from your vagina. It may be gray or white. It can be watery or foamy. A fishy smell. This can happen after sex or during your menstrual period. Itching in and around your vagina. A feeling of burning or pain when you pee (urinate). How is this treated? This infection is treated with antibiotic medicines. These may be given to you as: A pill. A cream for your vagina. A medicine that you put into your vagina (suppository). If the infection comes back after treatment, you may need more antibiotics. Follow these instructions at home: Medicines Take over-the-counter and prescription medicines as told by your doctor. Take or use your antibiotic medicine as told by your doctor. Do not stop taking or using it, even if you start to feel better. General instructions If the person you have sex with is a woman, tell her that you have this infection. She will need to follow up with her doctor. If you have a female partner, he does not need to be treated. Do not have sex until you finish treatment. Drink enough fluid to keep your pee pale yellow. Keep your vagina and butt clean. Wash the area with warm water each day. Wipe from front to back after you use the toilet. If you are breastfeeding a baby, ask your doctor if you should keep doing so during treatment. Keep all follow-up visits. How is this prevented? Self-care Do not douche. Use only warm water to wash  around your vagina. Wear underwear that is cotton or lined with cotton. Do not wear tight pants and pantyhose, especially in the summer. Safe sex Use protection when you have sex. This includes: Use condoms. Use dental dams. This is a thin layer that protects the mouth during oral sex. Limit how many people you have sex with.  To prevent this infection, it is best to have sex with just one person. Get tested for STIs. The person you have sex with should also get tested. Drugs and alcohol Do not smoke or use any products that contain nicotine or tobacco. If you need help quitting, ask your doctor. Do not use drugs. Do not drink alcohol if: Your doctor tells you not to drink. You are pregnant, may be pregnant, or are planning to become pregnant. If you drink alcohol: Limit how much you have to 0-1 drink a day. Know how much alcohol is in your drink. In the [place] one drink equals one 12 oz bottle of beer (355 mL), one 5 oz glass of wine (148 mL), or one 1 oz glass of hard liquor (44 mL). Where to find more information Centers for Disease Control and Prevention: http://www.wolf.info/ American Sexual Health Association: www.ashastd.org Office on [employer] Health: [place]s.tn Contact a doctor if: Your symptoms do not get better, even after you are treated. You have more discharge or pain when you pee. You have a fever or chills. You have pain in your belly (abdomen) or in the area between your hips. You have pain with sex. You bleed from your vagina between menstrual periods. Summary This infection can happen when too many germs (bacteria) grow in the vagina. This infection can make it easier to get infections from sex (STIs). Treating this can lower that chance. Get treated if you are pregnant. This infection can cause babies to be born early. Do not stop taking or using your antibiotic medicine, even if you start to feel better. This information is not intended to replace advice given to you by your health care provider. Make sure you discuss any questions you have with your health care provider. Document Revised: 07/07/2019 Document Reviewed: 07/07/2019 Elsevier Patient Education  [place].    If you have been instructed to have an in-person evaluation today at a local Urgent Care facility, please use  the link below. It will take you to a list of all of our available [facility], including address, phone number and hours of operation. Please do not delay care.  [facility]  If you or a family member do not have a primary care provider, use the link below to schedule a visit and establish care. When you choose a [facility] primary care physician or advanced practice provider, you gain a long-term partner in health. Find a Primary Care Provider  Learn more about [facility]'s in-office and virtual care options: [facility]

## 2021-08-27 ENCOUNTER — Telehealth: Payer: Medicaid Other | Admitting: Physician Assistant

## 2021-08-27 ENCOUNTER — Encounter: Payer: Medicaid Other | Admitting: Physician Assistant

## 2021-08-27 DIAGNOSIS — B379 Candidiasis, unspecified: Secondary | ICD-10-CM

## 2021-08-27 DIAGNOSIS — N898 Other specified noninflammatory disorders of vagina: Secondary | ICD-10-CM | POA: Diagnosis not present

## 2021-08-27 DIAGNOSIS — T3695XA Adverse effect of unspecified systemic antibiotic, initial encounter: Secondary | ICD-10-CM | POA: Diagnosis not present

## 2021-08-27 MED ORDER — FLUCONAZOLE 150 MG PO TABS: ORAL_TABLET | ORAL | 0 refills | Status: DC

## 2021-08-27 MED ORDER — HYDROXYZINE PAMOATE 25 MG PO CAPS: 25.0000 mg | ORAL_CAPSULE | Freq: Three times a day (TID) | ORAL | 0 refills | Status: DC | PRN

## 2021-08-27 NOTE — Progress Notes (Signed)
Virtual Visit Consent   Mary Ramos, you are scheduled for a virtual visit with a [facility] provider today. Just as with appointments in the office, your consent must be obtained to participate. Your consent will be active for this visit and any virtual visit you may have with one of our providers in the next 365 days. If you have a MyChart account, a copy of this consent can be sent to you electronically.  As this is a virtual visit, video technology does not allow for your provider to perform a traditional examination. This may limit your provider's ability to fully assess your condition. If your provider identifies any concerns that need to be evaluated in person or the need to arrange testing (such as labs, EKG, etc.), we will make arrangements to do so. Although advances in technology are sophisticated, we cannot ensure that it will always work on either your end or our end. If the connection with a video visit is poor, the visit may have to be switched to a telephone visit. With either a video or telephone visit, we are not always able to ensure that we have a secure connection.  By engaging in this virtual visit, you consent to the provision of healthcare and authorize for your insurance to be billed (if applicable) for the services provided during this visit. Depending on your insurance coverage, you may receive a charge related to this service.  I need to obtain your verbal consent now. Are you willing to proceed with your visit today? Mary Ramos has provided verbal consent on 08/27/2021 for a virtual visit (video or telephone). Leeanne Rio, [place]  Date: 08/27/2021 7:36 PM  Virtual Visit via Video Note   I, Leeanne Rio, connected with  Mary Ramos  ([id number], 03-May-1970) on 08/27/21 at  7:30 PM EDT by a video-enabled telemedicine application and verified that I am speaking with the correct person using two identifiers.  Location: Patient: Virtual Visit Location  Patient: Home Provider: Virtual Visit Location Provider: Home Office   I discussed the limitations of evaluation and management by telemedicine and the availability of in person appointments. The patient expressed understanding and agreed to proceed.    History of Present Illness: Mary Ramos is a 51 y.o. who identifies as a female who was assigned female at birth, and is being seen today for possible yeast infection after recent antibiotic use. Just finished course of Flagyl for BV. Those symptoms resolved but now with substantial vaginal pruritus. No discharge yet. Notes she is trying to keep a warm compress on the area but not helping. She is trying her best not to scratch the area. Notes significant history of yeast infection and this feels the same as the onset of previus episodes. Denies change to soaps, lotions. Does take bubble baths but no recent changes. Marland Kitchen  HPI: HPI  Problems:  Patient Active Problem List   Diagnosis Date Noted   Pancreatitis 03/31/2020   Current moderate episode of major depressive disorder without prior episode ([place]) 11/23/2017   Nephrolithiasis 11/23/2017   Cervical spinal stenosis 02/11/2017   Spondylosis of cervical region without myelopathy or radiculopathy 11/04/2016   Essential hypertension 07/01/2016   Tobacco dependence 07/01/2016    Allergies:  Allergies  Allergen Reactions   Carbomer Hives    Nickel    Medications:  Current Outpatient Medications:    fluconazole (DIFLUCAN) 150 MG tablet, Take 1 tablet PO once. Repeat in 3 days if needed., Disp: 2  tablet, Rfl: 0   hydrOXYzine (VISTARIL) 25 MG capsule, Take 1 capsule (25 mg total) by mouth every 8 (eight) hours as needed., Disp: 15 capsule, Rfl: 0   gabapentin (NEURONTIN) 100 MG capsule, Take 1 capsule (100 mg total) by mouth 3 (three) times daily., Disp: 90 capsule, Rfl: 3  Observations/Objective: Patient is well-developed, well-nourished in no acute distress.  Resting comfortably at home.   Head is normocephalic, atraumatic.  No labored breathing. Speech is clear and coherent with logical content.  Patient is alert and oriented at baseline.   Assessment and Plan: 1. Antibiotic-induced yeast infection - fluconazole (DIFLUCAN) 150 MG tablet; Take 1 tablet PO once. Repeat in 3 days if needed.  Dispense: 2 tablet; Refill: 0 - hydrOXYzine (VISTARIL) 25 MG capsule; Take 1 capsule (25 mg total) by mouth every 8 (eight) hours as needed.  Dispense: 15 capsule; Refill: 0  2. Vaginal pruritus  Severe vaginal pruritus, suspected to be an antibiotic-induced yeast infection as she has history of this and recently completed course of Flagyl for BV with resolution of those symptoms. Supportive measures and OTC medications reviewed. Switch to cold compresses if needed. Start Diflucan 150 mg once. Repeat in 3 days. Giving level of pruritus, will give short course of hydroxyzine to prevent trauma from excoriation.   Follow Up Instructions: I discussed the assessment and treatment plan with the patient. The patient was provided an opportunity to ask questions and all were answered. The patient agreed with the plan and demonstrated an understanding of the instructions.  A copy of instructions were sent to the patient via MyChart unless otherwise noted below.   The patient was advised to call back or seek an in-person evaluation if the symptoms worsen or if the condition fails to improve as anticipated.  Time:  I spent 10 minutes with the patient via telehealth technology discussing the above problems/concerns.    Leeanne Rio, PA-C

## 2021-08-27 NOTE — Telephone Encounter (Signed)
This encounter was created in error - please disregard.

## 2021-08-27 NOTE — Progress Notes (Signed)
Duplicate encounter -- patient seen via video.

## 2021-08-27 NOTE — Patient Instructions (Signed)
  [name], thank you for joining Leeanne Rio, PA-C for today's virtual visit.  While this provider is not your primary care provider (PCP), if your PCP is located in our provider database this encounter information will be shared with them immediately following your visit.  Consent: (Patient) [name] provided verbal consent for this virtual visit at the beginning of the encounter.  Current Medications:  Current Outpatient Medications:    fluconazole (DIFLUCAN) 150 MG tablet, Take 1 tablet PO once. Repeat in 3 days if needed., Disp: 2 tablet, Rfl: 0   hydrOXYzine (VISTARIL) 25 MG capsule, Take 1 capsule (25 mg total) by mouth every 8 (eight) hours as needed., Disp: 15 capsule, Rfl: 0   gabapentin (NEURONTIN) 100 MG capsule, Take 1 capsule (100 mg total) by mouth 3 (three) times daily., Disp: 90 capsule, Rfl: 3   Medications ordered in this encounter:  Meds ordered this encounter  Medications   fluconazole (DIFLUCAN) 150 MG tablet    Sig: Take 1 tablet PO once. Repeat in 3 days if needed.    Dispense:  2 tablet    Refill:  0    If on backorder, can we switch to Diflucan 200 mg instead with same dosing instructions? Ok to call [phone] (provider cell) if needed.    Order Specific Question:   Supervising Provider    Answer:   Noemi Chapel [[id number]]   hydrOXYzine (VISTARIL) 25 MG capsule    Sig: Take 1 capsule (25 mg total) by mouth every 8 (eight) hours as needed.    Dispense:  15 capsule    Refill:  0    Order Specific Question:   Supervising Provider    Answer:   Sabra Heck, [place]     *If you need refills on other medications prior to your next appointment, please contact your pharmacy*  Follow-Up: Call back or seek an in-person evaluation if the symptoms worsen or if the condition fails to improve as anticipated.  Other Instructions Apply cool compresses if needed. Take the hydroxyzine as directed for severe itch. It can make you sleepy so do not take  and then try to drive anywhere. Take the Diflucan as directed. If not resolving or you note new/worsening symptoms, please seek an in-person evaluation ASAP.   If you have been instructed to have an in-person evaluation today at a local Urgent Care facility, please use the link below. It will take you to a list of all of our available [facility], including address, phone number and hours of operation. Please do not delay care.  [facility]  If you or a family member do not have a primary care provider, use the link below to schedule a visit and establish care. When you choose a [facility] primary care physician or advanced practice provider, you gain a long-term partner in health. Find a Primary Care Provider  Learn more about [facility]'s in-office and virtual care options: [facility]

## 2021-09-18 ENCOUNTER — Ambulatory Visit: Payer: Medicaid Other | Admitting: Gastroenterology

## 2021-09-27 ENCOUNTER — Telehealth (HOSPITAL_BASED_OUTPATIENT_CLINIC_OR_DEPARTMENT_OTHER): Payer: Self-pay | Admitting: Obstetrics & Gynecology

## 2021-09-27 ENCOUNTER — Other Ambulatory Visit (HOSPITAL_BASED_OUTPATIENT_CLINIC_OR_DEPARTMENT_OTHER): Payer: Self-pay | Admitting: *Deleted

## 2021-09-27 MED ORDER — METRONIDAZOLE 500 MG PO TABS
500.0000 mg | ORAL_TABLET | Freq: Two times a day (BID) | ORAL | 0 refills | Status: DC
Start: 2021-09-27 — End: 2022-01-07

## 2021-09-27 NOTE — Progress Notes (Signed)
Pt calls with complaints of vaginal discharge with odor. She was recently treated for yeast infection but symptoms have not improved. Rx sent to pharmacy for treatment of BV based on pts symptoms and past swab results. Advised to use boric acid '600mg'$  suppositories twice weekly. Advised to let us know if she is still symptomatic after completion of meds

## 2021-09-27 NOTE — Telephone Encounter (Signed)
Patient called and would like for the nurse to please call about her prescription .

## 2021-11-14 ENCOUNTER — Telehealth (HOSPITAL_BASED_OUTPATIENT_CLINIC_OR_DEPARTMENT_OTHER): Payer: Self-pay | Admitting: Obstetrics & Gynecology

## 2021-11-14 NOTE — Telephone Encounter (Signed)
Patient called stated when she urinates  blood is coming out .Patient would Dr.Miller or the nurse to please call her.

## 2021-11-15 DIAGNOSIS — R310 Gross hematuria: Secondary | ICD-10-CM | POA: Diagnosis not present

## 2021-11-15 DIAGNOSIS — N3001 Acute cystitis with hematuria: Secondary | ICD-10-CM | POA: Diagnosis not present

## 2021-11-15 NOTE — Telephone Encounter (Signed)
Pt stated that she has had blood in her urine for the past week as well as right-sided lower back pain. Advised pt to go to urgent care for evaluation.

## 2021-11-22 DIAGNOSIS — Z1231 Encounter for screening mammogram for malignant neoplasm of breast: Secondary | ICD-10-CM | POA: Diagnosis not present

## 2021-11-22 DIAGNOSIS — R319 Hematuria, unspecified: Secondary | ICD-10-CM | POA: Diagnosis not present

## 2021-11-22 DIAGNOSIS — N200 Calculus of kidney: Secondary | ICD-10-CM | POA: Diagnosis not present

## 2021-11-22 DIAGNOSIS — Z1211 Encounter for screening for malignant neoplasm of colon: Secondary | ICD-10-CM | POA: Diagnosis not present

## 2021-11-22 DIAGNOSIS — I10 Essential (primary) hypertension: Secondary | ICD-10-CM | POA: Diagnosis not present

## 2021-11-22 DIAGNOSIS — Z23 Encounter for immunization: Secondary | ICD-10-CM | POA: Diagnosis not present

## 2021-11-23 DIAGNOSIS — Z90710 Acquired absence of both cervix and uterus: Secondary | ICD-10-CM | POA: Diagnosis not present

## 2021-11-23 DIAGNOSIS — N200 Calculus of kidney: Secondary | ICD-10-CM | POA: Diagnosis not present

## 2021-11-30 ENCOUNTER — Telehealth: Payer: Medicaid Other | Admitting: Nurse Practitioner

## 2021-11-30 DIAGNOSIS — J019 Acute sinusitis, unspecified: Secondary | ICD-10-CM

## 2021-11-30 DIAGNOSIS — B9689 Other specified bacterial agents as the cause of diseases classified elsewhere: Secondary | ICD-10-CM | POA: Diagnosis not present

## 2021-11-30 MED ORDER — AMOXICILLIN-POT CLAVULANATE 875-125 MG PO TABS: 1.0000 | ORAL_TABLET | Freq: Two times a day (BID) | ORAL | 0 refills | Status: AC

## 2021-11-30 NOTE — Patient Instructions (Signed)
  Mary Ramos, thank you for joining Gildardo Pounds, NP for today's virtual visit.  While this provider is not your primary care provider (PCP), if your PCP is located in our provider database this encounter information will be shared with them immediately following your visit.   [facility] account gives you access to today's visit and all your visits, tests, and labs performed at [facility] " click here if you don't have a [facility] account or go to [email or web address]  Consent: (Patient) Mary Ramos provided verbal consent for this virtual visit at the beginning of the encounter.  Current Medications:  Current Outpatient Medications:    amoxicillin-clavulanate (AUGMENTIN) 875-125 MG tablet, Take 1 tablet by mouth 2 (two) times daily for 7 days., Disp: 14 tablet, Rfl: 0   fluconazole (DIFLUCAN) 150 MG tablet, Take 1 tablet PO once. Repeat in 3 days if needed., Disp: 2 tablet, Rfl: 0   gabapentin (NEURONTIN) 100 MG capsule, Take 1 capsule (100 mg total) by mouth 3 (three) times daily., Disp: 90 capsule, Rfl: 3   hydrOXYzine (VISTARIL) 25 MG capsule, Take 1 capsule (25 mg total) by mouth every 8 (eight) hours as needed., Disp: 15 capsule, Rfl: 0   metroNIDAZOLE (FLAGYL) 500 MG tablet, Take 1 tablet (500 mg total) by mouth 2 (two) times daily., Disp: 14 tablet, Rfl: 0   Medications ordered in this encounter:  Meds ordered this encounter  Medications   amoxicillin-clavulanate (AUGMENTIN) 875-125 MG tablet    Sig: Take 1 tablet by mouth 2 (two) times daily for 7 days.    Dispense:  14 tablet    Refill:  0    Order Specific Question:   Supervising Provider    Answer:   Chase Picket [id number]     *If you need refills on other medications prior to your next appointment, please contact your pharmacy*  Follow-Up: Call back or seek an in-person evaluation if the symptoms worsen or if the condition fails to improve as anticipated.  [facility] [phone]  Other Instructions Continue over the counter nasal spray as instructed   If you have been instructed to have an in-person evaluation today at a local Urgent Care facility, please use the link below. It will take you to a list of all of our available [facility], including address, phone number and hours of operation. Please do not delay care.  [facility]  If you or a family member do not have a primary care provider, use the link below to schedule a visit and establish care. When you choose a [facility] primary care physician or advanced practice provider, you gain a long-term partner in health. Find a Primary Care Provider  Learn more about [facility]'s in-office and virtual care options: [facility]

## 2021-11-30 NOTE — Progress Notes (Signed)
Virtual Visit Consent   Mary Ramos, you are scheduled for a virtual visit with a [facility] provider today. Just as with appointments in the office, your consent must be obtained to participate. Your consent will be active for this visit and any virtual visit you may have with one of our providers in the next 365 days. If you have a MyChart account, a copy of this consent can be sent to you electronically.  As this is a virtual visit, video technology does not allow for your provider to perform a traditional examination. This may limit your provider's ability to fully assess your condition. If your provider identifies any concerns that need to be evaluated in person or the need to arrange testing (such as labs, EKG, etc.), we will make arrangements to do so. Although advances in technology are sophisticated, we cannot ensure that it will always work on either your end or our end. If the connection with a video visit is poor, the visit may have to be switched to a telephone visit. With either a video or telephone visit, we are not always able to ensure that we have a secure connection.  By engaging in this virtual visit, you consent to the provision of healthcare and authorize for your insurance to be billed (if applicable) for the services provided during this visit. Depending on your insurance coverage, you may receive a charge related to this service.  I need to obtain your verbal consent now. Are you willing to proceed with your visit today? Mary Ramos has provided verbal consent on 11/30/2021 for a virtual visit (video or telephone). Gildardo Pounds, NP  Date: 11/30/2021 4:08 PM  Virtual Visit via Video Note   I, Gildardo Pounds, connected with  Mary Ramos  ([id number], 03/30/1970) on 11/30/21 at  4:00 PM EST by a video-enabled telemedicine application and verified that I am speaking with the correct person using two identifiers.  Location: Patient: Virtual Visit Location  Patient: Home Provider: Virtual Visit Location Provider: Home Office   I discussed the limitations of evaluation and management by telemedicine and the availability of in person appointments. The patient expressed understanding and agreed to proceed.    History of Present Illness: Mary Ramos is a 51 y.o. who identifies as a female who was assigned female at birth, and is being seen today for bacterial sinusitis.  Patient complains of congestion. Symptoms include congestion, cough described as nonproductive, facial pain, headache described as pressure, low grade fever, nasal congestion, post nasal drip, purulent nasal discharge, sore throat, and fatigue  Onset of symptoms was several days ago, gradually worsening since that time. She reports current symptoms similar to previous sinus infections. Patient is a current smoker.    Problems:  Patient Active Problem List   Diagnosis Date Noted   Pancreatitis 03/31/2020   Current moderate episode of major depressive disorder without prior episode ([place]) 11/23/2017   Nephrolithiasis 11/23/2017   Cervical spinal stenosis 02/11/2017   Spondylosis of cervical region without myelopathy or radiculopathy 11/04/2016   Essential hypertension 07/01/2016   Tobacco dependence 07/01/2016    Allergies:  Allergies  Allergen Reactions   Carbomer Hives    Nickel    Medications:  Current Outpatient Medications:    amoxicillin-clavulanate (AUGMENTIN) 875-125 MG tablet, Take 1 tablet by mouth 2 (two) times daily for 7 days., Disp: 14 tablet, Rfl: 0   fluconazole (DIFLUCAN) 150 MG tablet, Take 1 tablet PO once. Repeat in 3 days if  needed., Disp: 2 tablet, Rfl: 0   gabapentin (NEURONTIN) 100 MG capsule, Take 1 capsule (100 mg total) by mouth 3 (three) times daily., Disp: 90 capsule, Rfl: 3   hydrOXYzine (VISTARIL) 25 MG capsule, Take 1 capsule (25 mg total) by mouth every 8 (eight) hours as needed., Disp: 15 capsule, Rfl: 0   metroNIDAZOLE (FLAGYL) 500 MG  tablet, Take 1 tablet (500 mg total) by mouth 2 (two) times daily., Disp: 14 tablet, Rfl: 0  Observations/Objective: Patient is well-developed, well-nourished in no acute distress.  Resting comfortably  at home.  Head is normocephalic, atraumatic.  No labored breathing.  Speech is clear and coherent with logical content.  Patient is alert and oriented at baseline.    Assessment and Plan: 1. Acute bacterial sinusitis - amoxicillin-clavulanate (AUGMENTIN) 875-125 MG tablet; Take 1 tablet by mouth 2 (two) times daily for 7 days.  Dispense: 14 tablet; Refill: 0 Continue over the counter nasal spray as instructed  Follow Up Instructions: I discussed the assessment and treatment plan with the patient. The patient was provided an opportunity to ask questions and all were answered. The patient agreed with the plan and demonstrated an understanding of the instructions.  A copy of instructions were sent to the patient via MyChart unless otherwise noted below.    The patient was advised to call back or seek an in-person evaluation if the symptoms worsen or if the condition fails to improve as anticipated.  Time:  I spent 11 minutes with the patient via telehealth technology discussing the above problems/concerns.    Gildardo Pounds, NP

## 2021-12-13 DIAGNOSIS — Z1211 Encounter for screening for malignant neoplasm of colon: Secondary | ICD-10-CM | POA: Diagnosis not present

## 2021-12-18 ENCOUNTER — Telehealth (HOSPITAL_BASED_OUTPATIENT_CLINIC_OR_DEPARTMENT_OTHER): Payer: Self-pay | Admitting: Obstetrics & Gynecology

## 2021-12-18 ENCOUNTER — Ambulatory Visit (INDEPENDENT_AMBULATORY_CARE_PROVIDER_SITE_OTHER): Payer: Medicaid Other | Admitting: Physician Assistant

## 2021-12-18 VITALS — BP 124/78 | HR 73

## 2021-12-18 DIAGNOSIS — R310 Gross hematuria: Secondary | ICD-10-CM

## 2021-12-18 LAB — POCT URINALYSIS DIPSTICK
Glucose, UA: NEGATIVE
Leukocytes, UA: NEGATIVE
Nitrite, UA: NEGATIVE
Protein, UA: POSITIVE — AB
Spec Grav, UA: 1.025 (ref 1.010–1.025)
Urobilinogen, UA: 1 E.U./dL
pH, UA: 5 (ref 5.0–8.0)

## 2021-12-18 NOTE — Progress Notes (Signed)
12/18/2021 11:11 AM   Mary Ramos [birth year] [id number]   Assessment:  1. Gross hematuria - POCT urinalysis dipstick    Plan: Today I had a discussion with the patient regarding the findings of gross hematuria including the implications and differential diagnoses associated with it.  I also discussed recommendations for further evaluation including the rationale for upper tract imaging and cystoscopy.  I discussed the nature of these procedures including potential risk and complications.  The patient expressed an understanding of these issues.  She will follow-up for cystoscopic evaluation with Dr. Alyson Ingles following CT hematuria study.    Chief Complaint: Mary Ramos hematuria  Referring provider: Bernerd Limbo, MD [address] [place],  [place] [id number]   History of Present Illness:  Mary Ramos is a 51 y.o. year old female with remote history of urinary stone disease who is seen in consultation from Bernerd Limbo, MD  for evaluation of gross hematuria.  Patient reports intermittent gross hematuria for a couple of years, but it has been worse and more consistent over the past few months.  She develops symptoms of UTI last month and was treated at urgent care with negative culture.  Hematuria persisted and she developed left-sided flank and abdominal pain prompting visit with her primary care provider who referred her here.  Today, she continues to have nagging left-sided abdominal pain without nausea or vomiting.  She denies fever, chills, burning, dysuria, increasing frequency, urgency.  No history of UTIs or incontinence.  Patient is a longtime smoker. Pelvic history significant for surgical intervention for ovarian cyst.  The patient thinks her previous stone was treated at [facility] urology in [place].  UA= 11-30 WBCs, greater than 30 RBCs, no bacteria, nitrite negative   Medical records including notes, lab results, and imaging studies reviewed during pt  OV.  Past Medical History:  Past Medical History:  Diagnosis Date   History of abnormal cervical Pap smear 2015   ASCUS with +HR HPV   History of seizures as a child    father dropped pt as a baby. Hasnt had a seizure in over 30 years.   Hypertension    Pancreatitis     Past Surgical History:  Past Surgical History:  Procedure Laterality Date   CYSTOSCOPY N/A 08/01/2020   Procedure: CYSTOSCOPY;  Surgeon: Megan Salon, MD;  Location: [facility];  Service: Gynecology;  Laterality: N/A;   HERNIA REPAIR  1975   LEEP     years ago   NECK SURGERY  2017   with right shoulder surgery   TOTAL LAPAROSCOPIC HYSTERECTOMY WITH BILATERAL SALPINGO OOPHORECTOMY Bilateral 08/01/2020   Procedure: TOTAL LAPAROSCOPIC HYSTERECTOMY WITH BILATERAL SALPINGO OOPHORECTOMY;  Surgeon: Megan Salon, MD;  Location: [facility];  Service: Gynecology;  Laterality: Bilateral;    Allergies:  Allergies  Allergen Reactions   Carbomer Hives    Nickel     Family History:  Family History  Problem Relation Age of Onset   Lupus Mother    Pancreatic cancer Father     Social History:  Social History   Tobacco Use   Smoking status: Every Day    Packs/day: 0.25    Types: Cigarettes   Smokeless tobacco: Never   Tobacco comments:    2 per day  Vaping Use   Vaping Use: Never used  Substance Use Topics   Alcohol use: No    Alcohol/week: 1.0 standard drink of alcohol    Types: 1 Standard drinks or equivalent per  week   Drug use: Yes    Types: Marijuana    Comment: everyday. currently holding for procedure.    Review of symptoms:  Constitutional:  Negative for unexplained weight loss, night sweats, fever, chills ENT:  Negative for nose bleeds, sinus pain, painful swallowing CV:  Negative for chest pain, shortness of breath, exercise intolerance, palpitations, loss of consciousness Resp:  Negative for cough, wheezing, shortness of breath GI:  Negative for nausea, vomiting, diarrhea, bloody stools GU:   Positives noted in HPI; otherwise negative for dysuria, urinary incontinence Neuro:  Negative for seizures, poor balance, limb weakness, slurred speech Psych:  Negative for lack of energy, depression, anxiety Endocrine:  Negative for polydipsia, polyuria, symptoms of hypoglycemia (dizziness, hunger, sweating) Hematologic:  Negative for anemia, purpura, petechia, prolonged or excessive bleeding, use of anticoagulants   Physical Exam: LMP 03/12/2020   Constitutional:  Alert and oriented, No acute distress. HEENT: NCAT, moist mucus membranes.  Trachea midline, no masses. Cardiovascular: Regular rate and rhythm without murmur, rub, or gallops No clubbing, cyanosis, or edema. Respiratory: Normal respiratory effort, clear to auscultation bilaterally GI: Abdomen is soft, mild LLQ tenderness without guarding, nondistended, no abdominal masses BACK:  Non-tender to palpation.  No CVAT Skin: No obvious rashes, warm, dry, intact Neurologic: Alert and oriented, Cranial nerves grossly intact, no focal deficits, moving all 4 extremities. Psychiatric: Appropriate. Normal mood and affect.  Laboratory Data: Results for orders placed or performed in visit on 12/18/21 (from the past 24 hour(s))  POCT urinalysis dipstick   Collection Time: 12/18/21 11:04 AM  Result Value Ref Range   Color, UA     Clarity, UA     Glucose, UA Negative Negative   Bilirubin, UA 1+    Ketones, UA Trace    Spec Grav, UA 1.025 1.010 - 1.025   Blood, UA 3+    pH, UA 5.0 5.0 - 8.0   Protein, UA Positive (A) Negative   Urobilinogen, UA 1.0 0.2 or 1.0 E.U./dL   Nitrite, UA negative    Leukocytes, UA Negative Negative   Appearance     Odor      Lab Results  Component Value Date   WBC 5.9 12/27/2020   HGB 12.5 12/27/2020   HCT 38.5 12/27/2020   MCV 85 12/27/2020   PLT 395 12/27/2020    Lab Results  Component Value Date   CREATININE 0.93 12/18/2020     Urinalysis    Component Value Date/Time   COLORURINE  YELLOW 12/18/2020 1210   APPEARANCEUR CLOUDY (A) 12/18/2020 1210   LABSPEC 1.020 12/18/2020 1210   PHURINE 5.0 12/18/2020 1210   GLUCOSEU NEGATIVE 12/18/2020 1210   HGBUR LARGE (A) 12/18/2020 1210   BILIRUBINUR 1+ 12/18/2021 1104   KETONESUR 5 (A) 12/18/2020 1210   PROTEINUR Positive (A) 12/18/2021 1104   PROTEINUR 30 (A) 12/18/2020 1210   UROBILINOGEN 1.0 12/18/2021 1104   UROBILINOGEN 1.0 11/30/2013 1401   NITRITE negative 12/18/2021 1104   NITRITE NEGATIVE 12/18/2020 1210   LEUKOCYTESUR Negative 12/18/2021 1104   LEUKOCYTESUR LARGE (A) 12/18/2020 1210    Lab Results  Component Value Date   BACTERIA MANY (A) 12/18/2020    Pertinent Imaging: No results found for this or any previous visit.  No results found for this or any previous visit.     [place], Berneice Heinrich, PA-C [facility] [place]

## 2021-12-18 NOTE — Telephone Encounter (Signed)
Patient called today and would like for Dr.Miller to please call?Per the patient she was seen by her doctor  today and she still has blood in her urine .

## 2021-12-19 LAB — URINALYSIS, ROUTINE W REFLEX MICROSCOPIC
Bilirubin, UA: NEGATIVE
Glucose, UA: NEGATIVE
Ketones, UA: NEGATIVE
Nitrite, UA: POSITIVE — AB
Specific Gravity, UA: 1.027 (ref 1.005–1.030)
Urobilinogen, Ur: 0.2 mg/dL (ref 0.2–1.0)
pH, UA: 5 (ref 5.0–7.5)

## 2021-12-19 LAB — MICROSCOPIC EXAMINATION
Bacteria, UA: NONE SEEN
Casts: NONE SEEN /lpf
RBC, Urine: >30 /hpf — AB (ref 0–2)

## 2021-12-19 NOTE — Telephone Encounter (Signed)
Patient called today. She states that the doctor she saw at urology told her to call Dr. Sabra Heck to ask her if she thought the blood in her bladder could be coming from the surgery that Dr. Sabra Heck performed.  I explained to the patient that Dr. Sabra Heck has referred her to urology to evaluate the blood in her urine. I asked patient if she had a cystoscopy performed while  she was in the office. She said that she is set up to have that done tomorrow. I explained to the patient that it is important to keep that appointment. That testing could shed some light on what is going on with her. I did let her know that I would let Dr. Sabra Heck know what is going on and if she wants her to do something different I will give her a call back. tbw

## 2021-12-20 ENCOUNTER — Ambulatory Visit (INDEPENDENT_AMBULATORY_CARE_PROVIDER_SITE_OTHER): Payer: Medicaid Other | Admitting: Urology

## 2021-12-20 VITALS — BP 150/87 | HR 75

## 2021-12-20 DIAGNOSIS — R310 Gross hematuria: Secondary | ICD-10-CM | POA: Diagnosis not present

## 2021-12-20 LAB — URINALYSIS, ROUTINE W REFLEX MICROSCOPIC
Bilirubin, UA: NEGATIVE
Leukocytes,UA: NEGATIVE
Nitrite, UA: NEGATIVE
Specific Gravity, UA: 1.025 (ref 1.005–1.030)
Urobilinogen, Ur: 0.2 mg/dL (ref 0.2–1.0)
pH, UA: 5 (ref 5.0–7.5)

## 2021-12-20 LAB — MICROSCOPIC EXAMINATION
Bacteria, UA: NONE SEEN
RBC, Urine: >30 /hpf — AB (ref 0–2)

## 2021-12-20 MED ORDER — CIPROFLOXACIN HCL 500 MG PO TABS
500.0000 mg | ORAL_TABLET | Freq: Once | ORAL | Status: AC
  Administered 2021-12-20: 500 mg via ORAL

## 2021-12-20 NOTE — Progress Notes (Signed)
   12/20/21  CC: hematuria   HPI: Mary Ramos is a 51yo here for cystoscopy for hematuria Blood pressure (!) 150/87, pulse 75, last menstrual period 03/12/2020. NED. A&Ox3.   No respiratory distress   Abd soft, NT, ND Normal external genitalia with patent urethral meatus  Cystoscopy Procedure Note  Patient identification was confirmed, informed consent was obtained, and patient was prepped using Betadine solution.  Lidocaine jelly was administered per urethral meatus.    Procedure: - Flexible cystoscope introduced, without any difficulty.   - Thorough search of the bladder revealed:    normal urethral meatus    normal urothelium    no stones    no ulcers     no tumors    no urethral polyps    no trabeculation  - Ureteral orifices were normal in position and appearance.  Post-Procedure: - Patient tolerated the procedure well  Assessment/ Plan: Urine for cytology. Patient will likely need cysto bilateral retrogrades, bilateral diagnostic ureteroscopy   No follow-ups on file.  Nicolette Bang, MD

## 2021-12-21 LAB — COLOGUARD: COLOGUARD: NEGATIVE

## 2021-12-23 LAB — CYTOLOGY, URINE

## 2021-12-28 ENCOUNTER — Emergency Department (HOSPITAL_COMMUNITY)
Admission: EM | Admit: 2021-12-28 | Discharge: 2021-12-29 | Discharge status: Against Medical Advice | Payer: Medicaid Other | Attending: Student | Admitting: Student

## 2021-12-28 ENCOUNTER — Encounter (HOSPITAL_COMMUNITY): Payer: Self-pay

## 2021-12-28 DIAGNOSIS — R112 Nausea with vomiting, unspecified: Secondary | ICD-10-CM | POA: Insufficient documentation

## 2021-12-28 DIAGNOSIS — R310 Gross hematuria: Secondary | ICD-10-CM | POA: Diagnosis not present

## 2021-12-28 DIAGNOSIS — Z5321 Procedure and treatment not carried out due to patient leaving prior to being seen by health care provider: Secondary | ICD-10-CM | POA: Diagnosis not present

## 2021-12-28 DIAGNOSIS — R1030 Lower abdominal pain, unspecified: Secondary | ICD-10-CM | POA: Insufficient documentation

## 2021-12-28 LAB — COMPREHENSIVE METABOLIC PANEL
ALT: 11 U/L (ref 0–44)
AST: 17 U/L (ref 15–41)
Albumin: 3.7 g/dL (ref 3.5–5.0)
Alkaline Phosphatase: 75 U/L (ref 38–126)
Anion gap: 12 (ref 5–15)
BUN: 25 mg/dL — ABNORMAL HIGH (ref 6–20)
CO2: 25 mmol/L (ref 22–32)
Calcium: 9.9 mg/dL (ref 8.9–10.3)
Chloride: 100 mmol/L (ref 98–111)
Creatinine, Ser: 1.3 mg/dL — ABNORMAL HIGH (ref 0.44–1.00)
GFR, Estimated: 50 mL/min — ABNORMAL LOW (ref >60)
Glucose, Bld: 132 mg/dL — ABNORMAL HIGH (ref 70–99)
Potassium: 3.6 mmol/L (ref 3.5–5.1)
Sodium: 137 mmol/L (ref 135–145)
Total Bilirubin: 0.6 mg/dL (ref 0.3–1.2)
Total Protein: 8.9 g/dL — ABNORMAL HIGH (ref 6.5–8.1)

## 2021-12-28 LAB — LIPASE, BLOOD: Lipase: 55 U/L — ABNORMAL HIGH (ref 11–51)

## 2021-12-28 LAB — URINALYSIS, ROUTINE W REFLEX MICROSCOPIC
Bacteria, UA: NONE SEEN
Bilirubin Urine: NEGATIVE
Glucose, UA: NEGATIVE mg/dL
Ketones, ur: NEGATIVE mg/dL
Leukocytes,Ua: NEGATIVE
Nitrite: NEGATIVE
Protein, ur: NEGATIVE mg/dL
Specific Gravity, Urine: 1.02 (ref 1.005–1.030)
pH: 5 (ref 5.0–8.0)

## 2021-12-28 LAB — CBC WITH DIFFERENTIAL/PLATELET
Abs Immature Granulocytes: 0.01 10*3/uL (ref 0.00–0.07)
Basophils Absolute: 0 10*3/uL (ref 0.0–0.1)
Basophils Relative: 1 %
Eosinophils Absolute: 0.3 10*3/uL (ref 0.0–0.5)
Eosinophils Relative: 4 %
HCT: 35.8 % — ABNORMAL LOW (ref 36.0–46.0)
Hemoglobin: 11.7 g/dL — ABNORMAL LOW (ref 12.0–15.0)
Immature Granulocytes: 0 %
Lymphocytes Relative: 39 %
Lymphs Abs: 2.7 10*3/uL (ref 0.7–4.0)
MCH: 27.5 pg (ref 26.0–34.0)
MCHC: 32.7 g/dL (ref 30.0–36.0)
MCV: 84 fL (ref 80.0–100.0)
Monocytes Absolute: 0.6 10*3/uL (ref 0.1–1.0)
Monocytes Relative: 8 %
Neutro Abs: 3.3 10*3/uL (ref 1.7–7.7)
Neutrophils Relative %: 48 %
Platelets: 286 10*3/uL (ref 150–400)
RBC: 4.26 MIL/uL (ref 3.87–5.11)
RDW: 15 % (ref 11.5–15.5)
WBC: 6.9 10*3/uL (ref 4.0–10.5)
nRBC: 0 % (ref 0.0–0.2)

## 2021-12-28 MED ORDER — OXYBUTYNIN CHLORIDE 5 MG PO TABS: 5.0000 mg | ORAL_TABLET | Freq: Once | ORAL | Status: DC

## 2021-12-28 NOTE — ED Provider Triage Note (Signed)
Emergency Medicine Provider Triage Evaluation Note  Mary Ramos , a 51 y.o. female  was evaluated in triage.  Pt complains of suprapubic pain, nausea, vomiting.  Patient has been evaluated over the past month for gross hematuria and flank pain.  CT abdomen pelvis in early November showed possible 2 mm nonobstructing stones.  She was evaluated by urology and had a cystoscopy on December 1.  She states that since the cystoscopy she has had this continual urge to urinate and a feeling that she needs to urinate even though there is little urine output.  She has been able to urinate.  She also endorses some nausea and vomiting.  Denies flank pain at this time.  Review of Systems  Positive: As above Negative: As above  Physical Exam  BP (!) 142/86 (BP Location: Left Arm)   Pulse 72   Temp 98.2 F (36.8 C) (Oral)   Resp 20   LMP 03/12/2020   SpO2 100%  Gen:   Awake, no distress   Resp:  Normal effort  MSK:   Moves extremities without difficulty  Other:    Medical Decision Making  Medically screening exam initiated at 6:29 PM.  Appropriate orders placed.  [name] was informed that the remainder of the evaluation will be completed by another provider, this initial triage assessment does not replace that evaluation, and the importance of remaining in the ED until their evaluation is complete.     Dorothyann Peng, PA-C 12/28/21 1830

## 2021-12-28 NOTE — ED Triage Notes (Signed)
Pt presents with c/o abdominal pain. Pt recently had a cystoscopy done done approx one week ago and since then has been having lower abdominal pain.

## 2021-12-31 DIAGNOSIS — I10 Essential (primary) hypertension: Secondary | ICD-10-CM | POA: Diagnosis not present

## 2021-12-31 DIAGNOSIS — R5383 Other fatigue: Secondary | ICD-10-CM | POA: Diagnosis not present

## 2021-12-31 DIAGNOSIS — F321 Major depressive disorder, single episode, moderate: Secondary | ICD-10-CM | POA: Diagnosis not present

## 2021-12-31 DIAGNOSIS — Z8639 Personal history of other endocrine, nutritional and metabolic disease: Secondary | ICD-10-CM | POA: Diagnosis not present

## 2021-12-31 DIAGNOSIS — R7989 Other specified abnormal findings of blood chemistry: Secondary | ICD-10-CM | POA: Diagnosis not present

## 2021-12-31 DIAGNOSIS — D649 Anemia, unspecified: Secondary | ICD-10-CM | POA: Diagnosis not present

## 2021-12-31 NOTE — Patient Instructions (Signed)

## 2022-01-02 NOTE — Progress Notes (Signed)
Sent via mychart

## 2022-01-07 ENCOUNTER — Encounter (HOSPITAL_BASED_OUTPATIENT_CLINIC_OR_DEPARTMENT_OTHER): Payer: Self-pay | Admitting: Obstetrics & Gynecology

## 2022-01-07 ENCOUNTER — Ambulatory Visit (INDEPENDENT_AMBULATORY_CARE_PROVIDER_SITE_OTHER): Payer: Medicaid Other | Admitting: Obstetrics & Gynecology

## 2022-01-07 VITALS — BP 126/86 | HR 66 | Ht 71.0 in | Wt 136.8 lb

## 2022-01-07 DIAGNOSIS — R310 Gross hematuria: Secondary | ICD-10-CM

## 2022-01-07 NOTE — Progress Notes (Unsigned)
GYNECOLOGY  VISIT  CC:   discuss urology concerns  HPI: 51 y.o. [name] Single Black or African American female here for discussion of urology concerns.  She's been having gross hematuria and has seen Dr. Alyson Ingles at [facility].  Office cystoscopy was done.  There was blood and he could not see any definitive causes of bleeding.  Cystoscopy with retrograde pyelogram has been recommended.  She wants to know my opinion of this.  I fully agree with plan of care as she should not have blood in her urine and additional evaluation is warranted.  She says she trusts my opinion and desired my input.  Also, she did have CT scan done.  Renal stone was noted.  I reviewed this in care everywhere.  Some air noted in vaginal cuff with results stating fistula could have this appearance vs normal finding.  Pt denies any vagina air, odor, discharge, pain.  Reviewed with pt.  Feel she should complete urologic evaluation and then we can proceed with additional evaluation if needed.  Pt did have negative cologuard done 11/23/2021.   Past Medical History:  Diagnosis Date   History of abnormal cervical Pap smear 2015   ASCUS with +HR HPV   History of seizures as a child    father dropped pt as a baby. Hasnt had a seizure in over 30 years.   Hypertension    Pancreatitis     MEDS:   Current Outpatient Medications on File Prior to Visit  Medication Sig Dispense Refill   chlorthalidone (HYGROTON) 25 MG tablet Take 25 mg by mouth daily.     ferrous gluconate (FERGON) 324 MG tablet Take by mouth.     No current facility-administered medications on file prior to visit.    ALLERGIES: Carbomer  SH:  single, smoking  Review of Systems  Constitutional: Negative.   Genitourinary:  Positive for hematuria.    PHYSICAL EXAMINATION:    BP 126/86   Pulse 66   Ht '5\' 11"'$  (1.803 m)   Wt 136 lb 12.8 oz (62.1 kg)   LMP 03/12/2020   BMI 19.08 kg/m     General appearance: alert, cooperative and appears stated  age No other exam performed today  Assessment/Plan: 1. Gross hematuria - pt is going to proceed with evaluation with Dr. Alyson Ingles.  She is scheduled 01/23/2022.

## 2022-01-21 ENCOUNTER — Other Ambulatory Visit: Payer: Self-pay

## 2022-01-21 ENCOUNTER — Encounter (HOSPITAL_COMMUNITY)
Admission: RE | Admit: 2022-01-21 | Discharge: 2022-01-21 | Disposition: A | Discharge status: Home / Self Care | Payer: Medicaid Other | Source: Ambulatory Visit | Attending: Urology | Admitting: Urology

## 2022-01-21 ENCOUNTER — Encounter (HOSPITAL_COMMUNITY): Payer: Self-pay

## 2022-01-23 ENCOUNTER — Ambulatory Visit (HOSPITAL_COMMUNITY): Payer: Medicaid Other

## 2022-01-23 ENCOUNTER — Ambulatory Visit: Payer: Medicaid Other | Admitting: Physician Assistant

## 2022-01-23 ENCOUNTER — Ambulatory Visit (HOSPITAL_COMMUNITY)
Admission: RE | Admit: 2022-01-23 | Discharge: 2022-01-23 | Disposition: A | Discharge status: Home / Self Care | Payer: Medicaid Other | Attending: Urology | Admitting: Urology

## 2022-01-23 ENCOUNTER — Encounter (HOSPITAL_COMMUNITY)
Admission: RE | Disposition: A | Discharge status: Home / Self Care | Payer: Self-pay | Source: Home / Self Care | Attending: Urology

## 2022-01-23 ENCOUNTER — Encounter (HOSPITAL_COMMUNITY): Payer: Self-pay | Admitting: Urology

## 2022-01-23 ENCOUNTER — Ambulatory Visit (HOSPITAL_COMMUNITY): Payer: Medicaid Other | Admitting: Certified Registered Nurse Anesthetist

## 2022-01-23 ENCOUNTER — Ambulatory Visit (HOSPITAL_BASED_OUTPATIENT_CLINIC_OR_DEPARTMENT_OTHER): Payer: Medicaid Other | Admitting: Certified Registered Nurse Anesthetist

## 2022-01-23 DIAGNOSIS — Q2730 Arteriovenous malformation, site unspecified: Secondary | ICD-10-CM

## 2022-01-23 DIAGNOSIS — I10 Essential (primary) hypertension: Secondary | ICD-10-CM | POA: Insufficient documentation

## 2022-01-23 DIAGNOSIS — F17210 Nicotine dependence, cigarettes, uncomplicated: Secondary | ICD-10-CM | POA: Insufficient documentation

## 2022-01-23 DIAGNOSIS — R310 Gross hematuria: Secondary | ICD-10-CM | POA: Diagnosis not present

## 2022-01-23 DIAGNOSIS — Q2734 Arteriovenous malformation of renal vessel: Secondary | ICD-10-CM

## 2022-01-23 DIAGNOSIS — N2889 Other specified disorders of kidney and ureter: Secondary | ICD-10-CM | POA: Diagnosis not present

## 2022-01-23 DIAGNOSIS — N200 Calculus of kidney: Secondary | ICD-10-CM

## 2022-01-23 DIAGNOSIS — Q2739 Arteriovenous malformation, other site: Secondary | ICD-10-CM | POA: Insufficient documentation

## 2022-01-23 HISTORY — PX: URETEROSCOPY: SHX842

## 2022-01-23 HISTORY — PX: HOLMIUM LASER APPLICATION: SHX5852

## 2022-01-23 HISTORY — PX: CYSTOSCOPY WITH RETROGRADE PYELOGRAM, URETEROSCOPY AND STENT PLACEMENT: SHX5789

## 2022-01-23 SURGERY — CYSTOURETEROSCOPY, WITH RETROGRADE PYELOGRAM AND STENT INSERTION
Anesthesia: General | Site: Ureter | Laterality: Left

## 2022-01-23 MED ORDER — WATER FOR IRRIGATION, STERILE IR SOLN
Status: DC | PRN
  Administered 2022-01-23: 500 mL

## 2022-01-23 MED ORDER — LIDOCAINE HCL (PF) 2 % IJ SOLN
INTRAMUSCULAR | Status: AC
  Filled 2022-01-23: qty 5

## 2022-01-23 MED ORDER — LACTATED RINGERS IV SOLN: INTRAVENOUS | Status: DC | PRN

## 2022-01-23 MED ORDER — CEFAZOLIN SODIUM-DEXTROSE 2-4 GM/100ML-% IV SOLN
INTRAVENOUS | Status: AC
  Filled 2022-01-23: qty 100

## 2022-01-23 MED ORDER — PROPOFOL 10 MG/ML IV BOLUS
INTRAVENOUS | Status: AC
  Filled 2022-01-23: qty 20

## 2022-01-23 MED ORDER — CEFAZOLIN SODIUM-DEXTROSE 2-4 GM/100ML-% IV SOLN
2.0000 g | INTRAVENOUS | Status: AC
  Administered 2022-01-23: 2 g via INTRAVENOUS

## 2022-01-23 MED ORDER — OXYCODONE HCL 5 MG PO TABS: 5.0000 mg | ORAL_TABLET | ORAL | 0 refills | Status: AC | PRN

## 2022-01-23 MED ORDER — PROPOFOL 10 MG/ML IV BOLUS
INTRAVENOUS | Status: DC | PRN
  Administered 2022-01-23: 150 mg via INTRAVENOUS

## 2022-01-23 MED ORDER — DEXAMETHASONE SODIUM PHOSPHATE 10 MG/ML IJ SOLN
INTRAMUSCULAR | Status: AC
  Filled 2022-01-23: qty 1

## 2022-01-23 MED ORDER — DIATRIZOATE MEGLUMINE 30 % UR SOLN
URETHRAL | Status: AC
  Filled 2022-01-23: qty 100

## 2022-01-23 MED ORDER — LIDOCAINE 2% (20 MG/ML) 5 ML SYRINGE
INTRAMUSCULAR | Status: DC | PRN
  Administered 2022-01-23: 60 mg via INTRAVENOUS

## 2022-01-23 MED ORDER — ONDANSETRON HCL 4 MG/2ML IJ SOLN
INTRAMUSCULAR | Status: AC
  Filled 2022-01-23: qty 2

## 2022-01-23 MED ORDER — DEXAMETHASONE SODIUM PHOSPHATE 10 MG/ML IJ SOLN
INTRAMUSCULAR | Status: AC
  Filled 2022-01-23: qty 2

## 2022-01-23 MED ORDER — HYDROCODONE-ACETAMINOPHEN 7.5-325 MG PO TABS: 1.0000 | ORAL_TABLET | Freq: Once | ORAL | Status: DC | PRN

## 2022-01-23 MED ORDER — ONDANSETRON HCL 4 MG/2ML IJ SOLN: 4.0000 mg | Freq: Once | INTRAMUSCULAR | Status: DC | PRN

## 2022-01-23 MED ORDER — FENTANYL CITRATE (PF) 100 MCG/2ML IJ SOLN
INTRAMUSCULAR | Status: AC
  Filled 2022-01-23: qty 2

## 2022-01-23 MED ORDER — KETOROLAC TROMETHAMINE 30 MG/ML IJ SOLN: 30.0000 mg | Freq: Once | INTRAMUSCULAR | Status: DC | PRN

## 2022-01-23 MED ORDER — EPHEDRINE SULFATE-NACL 50-0.9 MG/10ML-% IV SOSY
PREFILLED_SYRINGE | INTRAVENOUS | Status: DC | PRN
  Administered 2022-01-23 (×2): 10 mg via INTRAVENOUS

## 2022-01-23 MED ORDER — LACTATED RINGERS IV SOLN: INTRAVENOUS | Status: DC

## 2022-01-23 MED ORDER — MEPERIDINE HCL 50 MG/ML IJ SOLN: 6.2500 mg | INTRAMUSCULAR | Status: DC | PRN

## 2022-01-23 MED ORDER — ONDANSETRON HCL 4 MG/2ML IJ SOLN
INTRAMUSCULAR | Status: DC | PRN
  Administered 2022-01-23: 4 mg via INTRAVENOUS

## 2022-01-23 MED ORDER — HYDROMORPHONE HCL 1 MG/ML IJ SOLN
0.2500 mg | INTRAMUSCULAR | Status: DC | PRN
  Administered 2022-01-23 (×3): 0.5 mg via INTRAVENOUS
  Filled 2022-01-23 (×3): qty 0.5

## 2022-01-23 MED ORDER — DEXAMETHASONE SODIUM PHOSPHATE 10 MG/ML IJ SOLN
INTRAMUSCULAR | Status: DC | PRN
  Administered 2022-01-23: 10 mg via INTRAVENOUS

## 2022-01-23 MED ORDER — ONDANSETRON HCL 4 MG/2ML IJ SOLN
INTRAMUSCULAR | Status: AC
  Filled 2022-01-23: qty 4

## 2022-01-23 MED ORDER — FENTANYL CITRATE (PF) 100 MCG/2ML IJ SOLN
INTRAMUSCULAR | Status: DC | PRN
  Administered 2022-01-23 (×4): 25 ug via INTRAVENOUS

## 2022-01-23 MED ORDER — MIDAZOLAM HCL 5 MG/5ML IJ SOLN
INTRAMUSCULAR | Status: DC | PRN
  Administered 2022-01-23: 2 mg via INTRAVENOUS

## 2022-01-23 MED ORDER — DIATRIZOATE MEGLUMINE 30 % UR SOLN
URETHRAL | Status: DC | PRN
  Administered 2022-01-23: 100 mL via URETHRAL

## 2022-01-23 MED ORDER — EPHEDRINE 5 MG/ML INJ
INTRAVENOUS | Status: AC
  Filled 2022-01-23: qty 5

## 2022-01-23 MED ORDER — ONDANSETRON HCL 4 MG PO TABS: 4.0000 mg | ORAL_TABLET | Freq: Every day | ORAL | 1 refills | Status: AC | PRN

## 2022-01-23 MED ORDER — MIDAZOLAM HCL 2 MG/2ML IJ SOLN
INTRAMUSCULAR | Status: AC
  Filled 2022-01-23: qty 2

## 2022-01-23 MED ORDER — PHENYLEPHRINE 80 MCG/ML (10ML) SYRINGE FOR IV PUSH (FOR BLOOD PRESSURE SUPPORT)
PREFILLED_SYRINGE | INTRAVENOUS | Status: AC
  Filled 2022-01-23: qty 10

## 2022-01-23 SURGICAL SUPPLY — 25 items
BAG DRAIN URO TABLE W/ADPT NS (BAG) ×2 IMPLANT
BAG DRN 8 ADPR NS SKTRN CSTL (BAG) ×2
BAG HAMPER (MISCELLANEOUS) ×2 IMPLANT
CATH INTERMIT  6FR 70CM (CATHETERS) ×2 IMPLANT
CLOTH BEACON ORANGE TIMEOUT ST (SAFETY) ×2 IMPLANT
DECANTER SPIKE VIAL GLASS SM (MISCELLANEOUS) ×2 IMPLANT
EXTRACTOR STONE NITINOL NGAGE (UROLOGICAL SUPPLIES) IMPLANT
FIBER LASER FLEXIVA 200 (UROLOGICAL SUPPLIES) IMPLANT
GLOVE BIO SURGEON STRL SZ8 (GLOVE) ×2 IMPLANT
GLOVE BIOGEL PI IND STRL 7.0 (GLOVE) ×4 IMPLANT
GOWN STRL REUS W/TWL LRG LVL3 (GOWN DISPOSABLE) ×2 IMPLANT
GOWN STRL REUS W/TWL XL LVL3 (GOWN DISPOSABLE) ×2 IMPLANT
GUIDEWIRE STR DUAL SENSOR (WIRE) ×2 IMPLANT
GUIDEWIRE STR ZIPWIRE 035X150 (MISCELLANEOUS) ×2 IMPLANT
IV NS IRRIG 3000ML ARTHROMATIC (IV SOLUTION) ×4 IMPLANT
KIT TURNOVER CYSTO (KITS) ×2 IMPLANT
MANIFOLD NEPTUNE II (INSTRUMENTS) ×2 IMPLANT
PACK CYSTO (CUSTOM PROCEDURE TRAY) ×2 IMPLANT
PAD ARMBOARD 7.5X6 YLW CONV (MISCELLANEOUS) ×2 IMPLANT
SHEATH URETERAL 12FRX35CM (MISCELLANEOUS) IMPLANT
STENT URET 6FRX26 CONTOUR (STENTS) IMPLANT
SYR 10ML LL (SYRINGE) ×2 IMPLANT
SYR CONTROL 10ML LL (SYRINGE) ×2 IMPLANT
TOWEL OR 17X26 4PK STRL BLUE (TOWEL DISPOSABLE) ×2 IMPLANT
WATER STERILE IRR 500ML POUR (IV SOLUTION) ×2 IMPLANT

## 2022-01-23 NOTE — Anesthesia Postprocedure Evaluation (Signed)
Anesthesia Post Note  Patient: Mary Ramos  Procedure(s) Performed: CYSTOSCOPY WITH RETROGRADE PYELOGRAM, URETEROSCOPY (Bilateral: Ureter) HOLMIUM LASER APPLICATION (Left: Ureter) URETEROSCOPY with stent placement (Left: Ureter)  Patient location during evaluation: PACU Anesthesia Type: General Level of consciousness: awake and alert Pain management: pain level controlled Vital Signs Assessment: post-procedure vital signs reviewed and stable Respiratory status: spontaneous breathing, nonlabored ventilation, respiratory function stable and patient connected to nasal cannula oxygen Cardiovascular status: blood pressure returned to baseline and stable Postop Assessment: no apparent nausea or vomiting Anesthetic complications: no   There were no known notable events for this encounter.   Last Vitals:  Vitals:   01/23/22 1200 01/23/22 1205  BP: (!) 144/91   Pulse: 61 62  Resp: 14 17  Temp:    SpO2: 100% 97%    Last Pain:  Vitals:   01/23/22 1205  TempSrc:   PainSc: [place]

## 2022-01-23 NOTE — Transfer of Care (Signed)
Immediate Anesthesia Transfer of Care Note  Patient: Mary Ramos  Procedure(s) Performed: CYSTOSCOPY WITH RETROGRADE PYELOGRAM, URETEROSCOPY (Bilateral: Ureter) HOLMIUM LASER APPLICATION (Left: Ureter) URETEROSCOPY with stent placement (Left: Ureter)  Patient Location: PACU  Anesthesia Type:General  Level of Consciousness: awake  Airway & Oxygen Therapy: Patient Spontanous Breathing and Patient connected to face mask oxygen  Post-op Assessment: Report given to RN and Post -op Vital signs reviewed and stable  Post vital signs: Reviewed and stable  Last Vitals:  Vitals Value Taken Time  BP    Temp    Pulse 69 01/23/22 1129  Resp 17 01/23/22 1129  SpO2 100 % 01/23/22 1129  Vitals shown include unvalidated device data.  Last Pain:  Vitals:   01/23/22 0906  TempSrc: Oral  PainSc: 0-No pain         Complications: No notable events documented.

## 2022-01-23 NOTE — H&P (Signed)
Urology Admission H&P  Chief Complaint: gross hematuria  History of Present Illness: [name] is a 52yo with recurrent gross hematuria with a normal CT and a normal cystoscopy and urine cytology. She denies any significant LUTS. No dysuria. No history of UTI  Past Medical History:  Diagnosis Date   History of abnormal cervical Pap smear 2015   ASCUS with +HR HPV   History of seizures as a child    father dropped pt as a baby. Hasnt had a seizure in over 30 years.   Hypertension    Pancreatitis    Past Surgical History:  Procedure Laterality Date   CYSTOSCOPY N/A 08/01/2020   Procedure: CYSTOSCOPY;  Surgeon: Megan Salon, MD;  Location: [facility];  Service: Gynecology;  Laterality: N/A;   HERNIA REPAIR  1975   LEEP     years ago   NECK SURGERY  2017   with right shoulder surgery   TOTAL LAPAROSCOPIC HYSTERECTOMY WITH BILATERAL SALPINGO OOPHORECTOMY Bilateral 08/01/2020   Procedure: TOTAL LAPAROSCOPIC HYSTERECTOMY WITH BILATERAL SALPINGO OOPHORECTOMY;  Surgeon: Megan Salon, MD;  Location: [facility];  Service: Gynecology;  Laterality: Bilateral;    Home Medications:  Current Facility-Administered Medications  Medication Dose Route Frequency Provider Last Rate Last Admin   ceFAZolin (ANCEF) 2-4 GM/100ML-% IVPB            ceFAZolin (ANCEF) IVPB 2g/100 mL premix  2 g Intravenous 30 min Pre-Op Dajon Lazar, Candee Furbish, MD       Allergies:  Allergies  Allergen Reactions   Nickel Hives    Family History  Problem Relation Age of Onset   Lupus Mother    Pancreatic cancer Father    Social History:  reports that she has been smoking cigarettes. She has been smoking an average of .25 packs per day. She has never used smokeless tobacco. She reports current drug use. Drug: Marijuana. She reports that she does not drink alcohol.  Review of Systems  Genitourinary:  Positive for hematuria.  All other systems reviewed and are negative.   Physical Exam:  Vital signs in last 24 hours: Temp:   [97.4 F (36.3 C)] 97.4 F (36.3 C) (01/04 0906) Pulse Rate:  [52] 52 (01/04 0906) Resp:  [16] 16 (01/04 0906) BP: (126)/(92) 126/92 (01/04 0906) SpO2:  [100 %] 100 % (01/04 0906) Weight:  [62 kg] 62 kg (01/04 0906) Physical Exam Vitals reviewed.  Constitutional:      Appearance: Normal appearance.  HENT:     Head: Normocephalic and atraumatic.     Mouth/Throat:     Mouth: Mucous membranes are dry.  Eyes:     Extraocular Movements: Extraocular movements intact.     Pupils: Pupils are equal, round, and reactive to light.  Cardiovascular:     Rate and Rhythm: Normal rate and regular rhythm.  Pulmonary:     Effort: Pulmonary effort is normal. No respiratory distress.  Abdominal:     General: Abdomen is flat. There is no distension.  Musculoskeletal:        General: No swelling. Normal range of motion.     Cervical back: Normal range of motion and neck supple.  Skin:    General: Skin is warm and dry.  Neurological:     General: No focal deficit present.     Mental Status: She is alert and oriented to person, place, and time.  Psychiatric:        Mood and Affect: Mood normal.  Behavior: Behavior normal.        Thought Content: Thought content normal.        Judgment: Judgment normal.     Laboratory Data:  No results found for this or any previous visit (from the past 24 hour(s)). No results found for this or any previous visit (from the past 240 hour(s)). Creatinine: No results for input(s): "CREATININE" in the last 168 hours. Baseline Creatinine:   Impression/Assessment:  51yo with gross hematuria  Plan:  Today I had a discussion with the patient regarding the findings of gross hematuria including the implications and differential diagnoses associated with it.  I also discussed recommendations for further evaluation including the rationale for upper tract imaging and cystoscopy.  I discussed the nature of these procedures including potential risk and  complications.  The patient expressed an understanding of these issues.  The risks/benefits/alternatives to bilateral diagnostic ureteroscopy was explained to the patient and she understands and wishes to proceed with surgery  Nicolette Bang 01/23/2022, 9:46 AM

## 2022-01-23 NOTE — Anesthesia Preprocedure Evaluation (Addendum)
Anesthesia Evaluation  Patient identified by MRN, date of birth, ID band Patient awake    Reviewed: Allergy & Precautions, H&P , NPO status , Patient's Chart, lab work & pertinent test results  Airway Mallampati: I  TM Distance: >3 FB Neck ROM: Full    Dental  (+)    Pulmonary Current Smoker and Patient abstained from smoking.   Pulmonary exam normal breath sounds clear to auscultation       Cardiovascular hypertension, Normal cardiovascular exam Rhythm:Regular Rate:Normal     Neuro/Psych    Depression    negative neurological ROS     GI/Hepatic negative GI ROS, Neg liver ROS,,,  Endo/Other  negative endocrine ROS    Renal/GU negative Renal ROS  negative genitourinary   Musculoskeletal negative musculoskeletal ROS (+)    Abdominal   Peds negative pediatric ROS (+)  Hematology negative hematology ROS (+)   Anesthesia Other Findings   Reproductive/Obstetrics negative OB ROS                             Anesthesia Physical Anesthesia Plan  ASA: 2  Anesthesia Plan: General   Post-op Pain Management:    Induction: Intravenous  PONV Risk Score and Plan:   Airway Management Planned: LMA  Additional Equipment:   Intra-op Plan:   Post-operative Plan: Extubation in OR  Informed Consent: I have reviewed the patients History and Physical, chart, labs and discussed the procedure including the risks, benefits and alternatives for the proposed anesthesia with the patient or authorized representative who has indicated his/her understanding and acceptance.     Dental advisory given  Plan Discussed with: CRNA  Anesthesia Plan Comments:         Anesthesia Quick Evaluation

## 2022-01-23 NOTE — Anesthesia Procedure Notes (Signed)
Procedure Name: LMA Insertion Date/Time: 01/23/2022 10:04 AM  Performed by: Genelle Bal, CRNAPre-anesthesia Checklist: Patient identified, Emergency Drugs available, Suction available and Patient being monitored Patient Re-evaluated:Patient Re-evaluated prior to induction Oxygen Delivery Method: Circle system utilized Preoxygenation: Pre-oxygenation with 100% oxygen Induction Type: IV induction Ventilation: Mask ventilation without difficulty LMA: LMA with gastric port inserted LMA Size: 4.0 Number of attempts: 1 Airway Equipment and Method: Bite block Placement Confirmation: positive ETCO2 Tube secured with: Tape Dental Injury: Teeth and Oropharynx as per pre-operative assessment

## 2022-01-23 NOTE — Op Note (Signed)
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 Bilateral retrograde pyelography 3.  Intraoperative fluoroscopy, under one hour, with interpretation 4.  Bilateral ureteroscopic stone manipulation with basket extraction 5.  Left renal mass ablation with laser 6. Left 6x26 JJ ureteral stent placement  Attending: Rosie Fate  Anesthesia: General  Estimated blood loss: None  Drains: left 6 x 26 JJ ureteral stent with tether  Specimens: stone for analysis  Antibiotics: ancef  Findings: bilateral mid and lower pole renal calculi. No hydronephrosis. Small left lower pole renal AVM successfully ablated. No masses/lesions in the bladder. Ureteral orifices in normal anatomic location.  Indications: Patient is a 52 year old female with a history of gross hematuria with a normal hematuria CT and office cystoscopy. After discussing treatment options, they decided proceed with bilateral diagnostic ureteroscopy.  Procedure her in detail: The patient was brought to the operating room and a brief timeout was done to ensure correct patient, correct procedure, correct site.  General anesthesia was administered patient was placed in dorsal lithotomy position.  Her genitalia was then prepped and draped in usual sterile fashion.  A rigid 91 French cystoscope was passed in the urethra and the bladder.  Bladder was inspected free masses or lesions.  the ureteral orifices were in the normal orthotopic locations. a 6 french ureteral catheter was then instilled into the left ureteral orifice.  a gentle retrograde was obtained and findings noted above. We then advanced a zipwire through the catheter and up to the renal pelvis.  we then removed the cystoscope and cannulated the left ureteral orifice with a semirigid ureteroscope.  We located no stone in the ureter. We then placed a sensor wire up to the renal pelvis. We  then advanced a flexible ureteroscope over the sensor wire and up to the renal pelvis. We then used the flexible ureteroscope to perform nephroscopy. We located calculi in the mid and lower poles which were removed with an NGage basket. We then noted a 21m left lower pole AVM which was cauterized with the laser. No residual bleeding was noted after ablation. We then placed a 6 x 26 double-j ureteral stent over the original zip wire. We then removed the wire and good coil was noted in the the renal pelvis under fluoroscopy and the bladder under direct vision.  We then turned out attention to the right side.  a 6 french ureteral catheter was then instilled into the right ureteral orifice.  a gentle retrograde was obtained and findings noted above. We then advanced a zipwire through the catheter and up to the renal pelvis.  we then removed the cystoscope and cannulated the right ureteral orifice with a semirigid ureteroscope.  We located no stone in the ureter. We then placed a sensor wire up to the renal pelvis. We then advanced a flexible ureteroscope over the sensor wire and up to the renal pelvis.  We then used the flexible ureteroscope to perform nephroscopy. We located calculi in the mid and lower poles which were removed with an NGage basket. We removed the scope and elected not to place a stent since this was an uncomplicated ureteroscopy. The bladder was then drained and this concluded the procedure which was well tolerated by patient.  Complications: None  Condition: Stable, extubated, transferred to PACU  Plan: Patient is to be discharged home as to follow-up in 2 weeks. She is to remove her stents by pulling the tehter in 72 hours

## 2022-01-27 LAB — CYTOLOGY - NON PAP

## 2022-01-28 ENCOUNTER — Encounter (HOSPITAL_COMMUNITY): Payer: Self-pay | Admitting: Urology

## 2022-01-29 ENCOUNTER — Ambulatory Visit: Payer: Medicaid Other | Admitting: Urology

## 2022-01-30 LAB — CALCULI, WITH PHOTOGRAPH (CLINICAL LAB)
Calcium Oxalate Monohydrate: 10 %
Uric Acid Calculi: 90 %
Weight Calculi: 12 mg

## 2022-01-31 ENCOUNTER — Encounter: Payer: Self-pay | Admitting: Urology

## 2022-01-31 ENCOUNTER — Ambulatory Visit (INDEPENDENT_AMBULATORY_CARE_PROVIDER_SITE_OTHER): Payer: Medicaid Other | Admitting: Urology

## 2022-01-31 VITALS — BP 111/71 | HR 66

## 2022-01-31 DIAGNOSIS — R310 Gross hematuria: Secondary | ICD-10-CM

## 2022-01-31 DIAGNOSIS — N200 Calculus of kidney: Secondary | ICD-10-CM | POA: Diagnosis not present

## 2022-01-31 LAB — URINALYSIS, ROUTINE W REFLEX MICROSCOPIC
Bilirubin, UA: NEGATIVE
Glucose, UA: NEGATIVE
Ketones, UA: NEGATIVE
Leukocytes,UA: NEGATIVE
Nitrite, UA: NEGATIVE
Specific Gravity, UA: 1.02 (ref 1.005–1.030)
Urobilinogen, Ur: 0.2 mg/dL (ref 0.2–1.0)
pH, UA: 5.5 (ref 5.0–7.5)

## 2022-01-31 LAB — MICROSCOPIC EXAMINATION: RBC, Urine: >30 /hpf — AB (ref 0–2)

## 2022-01-31 NOTE — Patient Instructions (Signed)

## 2022-01-31 NOTE — Progress Notes (Unsigned)
01/31/2022 9:54 AM   Mary Ramos [birth year] [id number]  Referring provider: Bernerd Limbo, MD [facility] [place],  [place] [address]  No chief complaint on file.   HPI:    PMH: Past Medical History:  Diagnosis Date   History of abnormal cervical Pap smear 2015   ASCUS with +HR HPV   History of seizures as a child    father dropped pt as a baby. Hasnt had a seizure in over 30 years.   Hypertension    Pancreatitis     Surgical History: Past Surgical History:  Procedure Laterality Date   CYSTOSCOPY N/A 08/01/2020   Procedure: CYSTOSCOPY;  Surgeon: Megan Salon, MD;  Location: [place];  Service: Gynecology;  Laterality: N/A;   CYSTOSCOPY WITH RETROGRADE PYELOGRAM, URETEROSCOPY AND STENT PLACEMENT Bilateral 01/23/2022   Procedure: CYSTOSCOPY WITH RETROGRADE PYELOGRAM, URETEROSCOPY;  Surgeon: Cleon Gustin, MD;  Location: [facility];  Service: Urology;  Laterality: Bilateral;   HERNIA REPAIR  1975   HOLMIUM LASER APPLICATION Left 09/22/2353   Procedure: HOLMIUM LASER APPLICATION;  Surgeon: Cleon Gustin, MD;  Location: [facility];  Service: Urology;  Laterality: Left;   LEEP     years ago   NECK SURGERY  2017   with right shoulder surgery   TOTAL LAPAROSCOPIC HYSTERECTOMY WITH BILATERAL SALPINGO OOPHORECTOMY Bilateral 08/01/2020   Procedure: TOTAL LAPAROSCOPIC HYSTERECTOMY WITH BILATERAL SALPINGO OOPHORECTOMY;  Surgeon: Megan Salon, MD;  Location: [place] [facility];  Service: Gynecology;  Laterality: Bilateral;   URETEROSCOPY Left 01/23/2022   Procedure: URETEROSCOPY with stent placement;  Surgeon: Cleon Gustin, MD;  Location: [facility];  Service: Urology;  Laterality: Left;    Home Medications:  Allergies as of 01/31/2022       Reactions   Nickel Hives        Medication List        Accurate as of January 31, 2022  9:54 AM. If you have any questions, ask your nurse or doctor.          DULoxetine 30 MG capsule Commonly known as:  CYMBALTA Take 30 mg by mouth daily.   ferrous gluconate 324 MG tablet Commonly known as: FERGON Take 324 mg by mouth daily.   ondansetron 4 MG tablet Commonly known as: Zofran Take 1 tablet (4 mg total) by mouth daily as needed for nausea or vomiting.   oxyCODONE 5 MG immediate release tablet Commonly known as: Roxicodone Take 1 tablet (5 mg total) by mouth every 4 (four) hours as needed for severe pain.        Allergies:  Allergies  Allergen Reactions   Nickel Hives    Family History: Family History  Problem Relation Age of Onset   Lupus Mother    Pancreatic cancer Father     Social History:  reports that she has been smoking cigarettes. She has been smoking an average of .25 packs per day. She has never used smokeless tobacco. She reports current drug use. Drug: Marijuana. She reports that she does not drink alcohol.  ROS: All other review of systems were reviewed and are negative except what is noted above in HPI  Physical Exam: BP 111/71   Pulse 66   LMP 03/12/2020   Constitutional:  Alert and oriented, No acute distress. HEENT: [place] AT, moist mucus membranes.  Trachea midline, no masses. Cardiovascular: No clubbing, cyanosis, or edema. Respiratory: Normal respiratory effort, no increased work of breathing. GI: Abdomen is soft, nontender, nondistended, no  abdominal masses GU: No CVA tenderness.  Lymph: No cervical or inguinal lymphadenopathy. Skin: No rashes, bruises or suspicious lesions. Neurologic: Grossly intact, no focal deficits, moving all 4 extremities. Psychiatric: Normal mood and affect.  Laboratory Data: Lab Results  Component Value Date   WBC 6.9 12/28/2021   HGB 11.7 (L) 12/28/2021   HCT 35.8 (L) 12/28/2021   MCV 84.0 12/28/2021   PLT 286 12/28/2021    Lab Results  Component Value Date   CREATININE 1.30 (H) 12/28/2021    No results found for: "PSA"  No results found for: "TESTOSTERONE"  No results found for:  "HGBA1C"  Urinalysis    Component Value Date/Time   COLORURINE YELLOW 12/28/2021 [place] 12/28/2021 1837   APPEARANCEUR Cloudy (A) 12/20/2021 1011   LABSPEC 1.020 12/28/2021 1837   PHURINE 5.0 12/28/2021 1837   GLUCOSEU NEGATIVE 12/28/2021 1837   HGBUR LARGE (A) 12/28/2021 1837   BILIRUBINUR NEGATIVE 12/28/2021 1837   BILIRUBINUR Negative 12/20/2021 [place] 12/28/2021 1837   PROTEINUR NEGATIVE 12/28/2021 1837   UROBILINOGEN 1.0 12/18/2021 1104   UROBILINOGEN 1.0 11/30/2013 1401   NITRITE NEGATIVE 12/28/2021 1837   LEUKOCYTESUR NEGATIVE 12/28/2021 1837    Lab Results  Component Value Date   LABMICR See below: 12/20/2021   WBCUA 0-5 12/20/2021   LABEPIT 0-10 12/20/2021   BACTERIA NONE SEEN 12/28/2021    Pertinent Imaging: *** No results found for this or any previous visit.  No results found for this or any previous visit.  No results found for this or any previous visit.  No results found for this or any previous visit.  No results found for this or any previous visit.  No valid procedures specified. No results found for this or any previous visit.  No results found for this or any previous visit.   Assessment & Plan:    1. Gross hematuria *** - Urinalysis, Routine w reflex microscopic  2. Nephrolithiasis -Followup 3 months KUB   No follow-ups on file.  Nicolette Bang, MD  [facility] [place]

## 2022-02-12 NOTE — Telephone Encounter (Signed)
No encounter needed

## 2022-02-13 ENCOUNTER — Ambulatory Visit: Payer: Medicaid Other | Admitting: Physician Assistant

## 2022-02-20 ENCOUNTER — Telehealth (HOSPITAL_BASED_OUTPATIENT_CLINIC_OR_DEPARTMENT_OTHER): Payer: Self-pay | Admitting: *Deleted

## 2022-02-20 ENCOUNTER — Telehealth (HOSPITAL_BASED_OUTPATIENT_CLINIC_OR_DEPARTMENT_OTHER): Payer: Self-pay | Admitting: Obstetrics & Gynecology

## 2022-02-20 NOTE — Telephone Encounter (Signed)
Patient called and would like something call in to her pharmacy for yeast infection due to all the antibiotics she been on.

## 2022-02-20 NOTE — Telephone Encounter (Signed)
Pt called to request prescription for medication to treat yeast infection be sent to her pharmacy. Pt states that the urologist has prescribed her antibiotics. Advised pt to call prescribing doctor to request medication for yeast and if they would not send, she could come into the office for self-swab to test for yeast. Pt verbalized understanding.

## 2022-02-20 NOTE — Telephone Encounter (Signed)
Patient called and left a message  that she returning Kim's call.

## 2022-02-26 ENCOUNTER — Ambulatory Visit (HOSPITAL_BASED_OUTPATIENT_CLINIC_OR_DEPARTMENT_OTHER): Payer: Medicaid Other

## 2022-02-26 ENCOUNTER — Telehealth (HOSPITAL_BASED_OUTPATIENT_CLINIC_OR_DEPARTMENT_OTHER): Payer: Self-pay | Admitting: *Deleted

## 2022-02-26 DIAGNOSIS — N898 Other specified noninflammatory disorders of vagina: Secondary | ICD-10-CM

## 2022-02-26 NOTE — Telephone Encounter (Signed)
Patient called and stated she needs to be seen she thinks she has a yeast infection.

## 2022-02-26 NOTE — Telephone Encounter (Signed)
Pt states that she feels like she has a yeast infection. She complains of vaginal discharge and itching. Pt provided with nurse visit for testing.

## 2022-02-26 NOTE — Progress Notes (Signed)
Patient came in today to do self aptima swab. She is complaining of vaginal itching and vaginal discharge.Patient also states that she has a foul odor. tbw

## 2022-02-27 ENCOUNTER — Telehealth (HOSPITAL_BASED_OUTPATIENT_CLINIC_OR_DEPARTMENT_OTHER): Payer: Self-pay

## 2022-02-27 NOTE — Telephone Encounter (Signed)
LMOM at 5:07 for patient to give our office a call back. Need to let patient know that the sample she came in our office to do on 02/26/2022 could not be evaluated per [facility] in the lab. She states that the foil on the top of the specimen had been punctured and could have been possibly contaminated. The sample will need to be recollected. tbw

## 2022-03-01 DIAGNOSIS — N760 Acute vaginitis: Secondary | ICD-10-CM | POA: Diagnosis not present

## 2022-03-02 DIAGNOSIS — N760 Acute vaginitis: Secondary | ICD-10-CM | POA: Diagnosis not present

## 2022-03-04 NOTE — Telephone Encounter (Signed)
Patient called back on 02/28/2022 and stated she will have to come in on Monday. tbw

## 2022-03-06 ENCOUNTER — Ambulatory Visit: Payer: Medicaid Other | Admitting: Physician Assistant

## 2022-03-10 ENCOUNTER — Encounter: Payer: Self-pay | Admitting: *Deleted

## 2022-04-26 ENCOUNTER — Ambulatory Visit (HOSPITAL_COMMUNITY): Payer: Medicaid Other

## 2022-04-28 ENCOUNTER — Ambulatory Visit (HOSPITAL_COMMUNITY)
Admission: RE | Admit: 2022-04-28 | Discharge: 2022-04-28 | Disposition: A | Discharge status: Home / Self Care | Payer: Medicaid Other | Source: Ambulatory Visit | Attending: Urology | Admitting: Urology

## 2022-04-28 ENCOUNTER — Ambulatory Visit: Payer: Medicaid Other | Admitting: Urology

## 2022-04-28 VITALS — BP 111/70 | HR 60

## 2022-04-28 DIAGNOSIS — Z87442 Personal history of urinary calculi: Secondary | ICD-10-CM

## 2022-04-28 DIAGNOSIS — N200 Calculus of kidney: Secondary | ICD-10-CM | POA: Diagnosis not present

## 2022-04-28 LAB — URINALYSIS, ROUTINE W REFLEX MICROSCOPIC
Bilirubin, UA: NEGATIVE
Glucose, UA: NEGATIVE
Ketones, UA: NEGATIVE
Leukocytes,UA: NEGATIVE
Nitrite, UA: NEGATIVE
Protein,UA: NEGATIVE
Specific Gravity, UA: 1.02 (ref 1.005–1.030)
Urobilinogen, Ur: 0.2 mg/dL (ref 0.2–1.0)
pH, UA: 5 (ref 5.0–7.5)

## 2022-04-28 LAB — MICROSCOPIC EXAMINATION: Bacteria, UA: NONE SEEN

## 2022-04-28 NOTE — Progress Notes (Unsigned)
04/28/2022 10:18 AM   Mary Ramos 11-Jul-1970 [id number]  Referring provider: Tracey Harries, MD [address] [place],  [place] [address]  No chief complaint on file.   HPI:    PMH: Past Medical History:  Diagnosis Date   History of abnormal cervical Pap smear 2015   ASCUS with +HR HPV   History of seizures as a child    father dropped pt as a baby. Hasnt had a seizure in over 30 years.   Hypertension    Pancreatitis     Surgical History: Past Surgical History:  Procedure Laterality Date   CYSTOSCOPY N/A 08/01/2020   Procedure: CYSTOSCOPY;  Surgeon: Jerene Bears, MD;  Location: [facility];  Service: Gynecology;  Laterality: N/A;   CYSTOSCOPY WITH RETROGRADE PYELOGRAM, URETEROSCOPY AND STENT PLACEMENT Bilateral 01/23/2022   Procedure: CYSTOSCOPY WITH RETROGRADE PYELOGRAM, URETEROSCOPY;  Surgeon: Malen Gauze, MD;  [facility];  Service: Urology;  Laterality: Bilateral;   HERNIA REPAIR  1975   HOLMIUM LASER APPLICATION Left 01/23/2022   Procedure: HOLMIUM LASER APPLICATION;  Surgeon: Malen Gauze, MD;  Location: [facility];  Service: Urology;  Laterality: Left;   LEEP     years ago   NECK SURGERY  2017   with right shoulder surgery   TOTAL LAPAROSCOPIC HYSTERECTOMY WITH BILATERAL SALPINGO OOPHORECTOMY Bilateral 08/01/2020   Procedure: TOTAL LAPAROSCOPIC HYSTERECTOMY WITH BILATERAL SALPINGO OOPHORECTOMY;  Surgeon: Jerene Bears, MD;  Location: [facility];  Service: Gynecology;  Laterality: Bilateral;   URETEROSCOPY Left 01/23/2022   Procedure: URETEROSCOPY with stent placement;  Surgeon: Malen Gauze, MD;  [facility];  Service: Urology;  Laterality: Left;    Home Medications:  Allergies as of 04/28/2022       Reactions   Nickel Hives        Medication List        Accurate as of April 28, 2022 10:18 AM. If you have any questions, ask your nurse or doctor.          DULoxetine 30 MG capsule Commonly known as: CYMBALTA Take  30 mg by mouth daily.   ferrous gluconate 324 MG tablet Commonly known as: FERGON Take 324 mg by mouth daily.   ondansetron 4 MG tablet Commonly known as: Zofran Take 1 tablet (4 mg total) by mouth daily as needed for nausea or vomiting.   oxyCODONE 5 MG immediate release tablet Commonly known as: Roxicodone Take 1 tablet (5 mg total) by mouth every 4 (four) hours as needed for severe pain.        Allergies:  Allergies  Allergen Reactions   Nickel Hives    Family History: Family History  Problem Relation Age of Onset   Lupus Mother    Pancreatic cancer Father     Social History:  reports that she has been smoking cigarettes. She has been smoking an average of .25 packs per day. She has never used smokeless tobacco. She reports current drug use. Drug: Marijuana. She reports that she does not drink alcohol.  ROS: All other review of systems were reviewed and are negative except what is noted above in HPI  Physical Exam: BP 111/70   Pulse 60   LMP 03/12/2020   Constitutional:  Alert and oriented, No acute distress. HEENT: [place] AT, moist mucus membranes.  Trachea midline, no masses. Cardiovascular: No clubbing, cyanosis, or edema. Respiratory: Normal respiratory effort, no increased work of breathing. GI: Abdomen is soft, nontender, nondistended, no abdominal  masses GU: No CVA tenderness.  Lymph: No cervical or inguinal lymphadenopathy. Skin: No rashes, bruises or suspicious lesions. Neurologic: Grossly intact, no focal deficits, moving all 4 extremities. Psychiatric: Normal mood and affect.  Laboratory Data: Lab Results  Component Value Date   WBC 6.9 12/28/2021   HGB 11.7 (L) 12/28/2021   HCT 35.8 (L) 12/28/2021   MCV 84.0 12/28/2021   PLT 286 12/28/2021    Lab Results  Component Value Date   CREATININE 1.30 (H) 12/28/2021    No results found for: "PSA"  No results found for: "TESTOSTERONE"  No results found for: "HGBA1C"  Urinalysis    Component  Value Date/Time   COLORURINE YELLOW 12/28/2021 1837   APPEARANCEUR Cloudy (A) 01/31/2022 1015   LABSPEC 1.020 12/28/2021 1837   PHURINE 5.0 12/28/2021 1837   GLUCOSEU Negative 01/31/2022 1015   HGBUR LARGE (A) 12/28/2021 1837   BILIRUBINUR Negative 01/31/2022 1015   KETONESUR NEGATIVE 12/28/2021 1837   PROTEINUR 1+ (A) 01/31/2022 1015   PROTEINUR NEGATIVE 12/28/2021 1837   UROBILINOGEN 1.0 12/18/2021 1104   UROBILINOGEN 1.0 11/30/2013 1401   NITRITE Negative 01/31/2022 1015   NITRITE NEGATIVE 12/28/2021 1837   LEUKOCYTESUR Negative 01/31/2022 1015   LEUKOCYTESUR NEGATIVE 12/28/2021 1837    Lab Results  Component Value Date   LABMICR See below: 01/31/2022   WBCUA 0-5 01/31/2022   LABEPIT 0-10 01/31/2022   BACTERIA Few 01/31/2022    Pertinent Imaging: *** No results found for this or any previous visit.  No results found for this or any previous visit.  No results found for this or any previous visit.  No results found for this or any previous visit.  No results found for this or any previous visit.  No valid procedures specified. No results found for this or any previous visit.  No results found for this or any previous visit.   Assessment & Plan:    1. Nephrolithiasis -followup 6 months with renal US - Urinalysis, Routine w reflex microscopic - DG Abd 1 View   No follow-ups on file.  Wilkie Aye, MD  [facility] [place]

## 2022-04-29 ENCOUNTER — Encounter: Payer: Self-pay | Admitting: Urology

## 2022-04-29 NOTE — Patient Instructions (Signed)

## 2022-07-16 ENCOUNTER — Telehealth: Payer: Medicaid Other | Admitting: Physician Assistant

## 2022-07-16 DIAGNOSIS — R3989 Other symptoms and signs involving the genitourinary system: Secondary | ICD-10-CM

## 2022-07-16 MED ORDER — NITROFURANTOIN MONOHYD MACRO 100 MG PO CAPS
100.0000 mg | ORAL_CAPSULE | Freq: Two times a day (BID) | ORAL | 0 refills | Status: AC
Start: 2022-07-16 — End: ?

## 2022-07-16 NOTE — Progress Notes (Signed)
Virtual Visit Consent   Mary Ramos, you are scheduled for a virtual visit with a [place] provider today. Just as with appointments in the office, your consent must be obtained to participate. Your consent will be active for this visit and any virtual visit you may have with one of our providers in the next 365 days. If you have a MyChart account, a copy of this consent can be sent to you electronically.  As this is a virtual visit, video technology does not allow for your provider to perform a traditional examination. This may limit your provider's ability to fully assess your condition. If your provider identifies any concerns that need to be evaluated in person or the need to arrange testing (such as labs, EKG, etc.), we will make arrangements to do so. Although advances in technology are sophisticated, we cannot ensure that it will always work on either your end or our end. If the connection with a video visit is poor, the visit may have to be switched to a telephone visit. With either a video or telephone visit, we are not always able to ensure that we have a secure connection.  By engaging in this virtual visit, you consent to the provision of healthcare and authorize for your insurance to be billed (if applicable) for the services provided during this visit. Depending on your insurance coverage, you may receive a charge related to this service.  I need to obtain your verbal consent now. Are you willing to proceed with your visit today? Mary Ramos has provided verbal consent on 07/16/2022 for a virtual visit (video or telephone). Margaretann Loveless, PA-C  Date: 07/16/2022 6:00 PM  Virtual Visit via Video Note   I, Margaretann Loveless, connected with  Mary Ramos  ([id number], January 02, 1971) on 07/16/22 at  6:00 PM EDT by a video-enabled telemedicine application and verified that I am speaking with the correct person using two identifiers.  Location: Patient: Virtual Visit Location  Patient: Home Provider: Virtual Visit Location Provider: Home Office   I discussed the limitations of evaluation and management by telemedicine and the availability of in person appointments. The patient expressed understanding and agreed to proceed.    History of Present Illness: Mary Ramos is a 52 y.o. who identifies as a female who was assigned female at birth, and is being seen today for possible UTI.  HPI: Urinary Tract Infection  This is a new problem. The current episode started in the past 7 days (2-3 days). The problem occurs every urination. The problem has been gradually worsening. The quality of the pain is described as stabbing and aching. There has been no fever. Associated symptoms include frequency, hesitancy, nausea (mild) and urgency. Pertinent negatives include no chills, discharge, flank pain or hematuria. Associated symptoms comments: Incontinence episode x 1. She has tried increased fluids for the symptoms. The treatment provided no relief. Her past medical history is significant for kidney stones and recurrent UTIs.      Problems:  Patient Active Problem List   Diagnosis Date Noted   Gross hematuria 01/23/2022   Pancreatitis 03/31/2020   Current moderate episode of major depressive disorder without prior episode (HCC) 11/23/2017   Nephrolithiasis 11/23/2017   Cervical spinal stenosis 02/11/2017   Spondylosis of cervical region without myelopathy or radiculopathy 11/04/2016   Essential hypertension 07/01/2016   Tobacco dependence 07/01/2016    Allergies:  Allergies  Allergen Reactions   Nickel Hives   Medications:  Current Outpatient Medications:  nitrofurantoin, macrocrystal-monohydrate, (MACROBID) 100 MG capsule, Take 1 capsule (100 mg total) by mouth 2 (two) times daily., Disp: 10 capsule, Rfl: 0   DULoxetine (CYMBALTA) 30 MG capsule, Take 30 mg by mouth daily., Disp: , Rfl:    ferrous gluconate (FERGON) 324 MG tablet, Take 324 mg by mouth daily.,  Disp: , Rfl:    ondansetron (ZOFRAN) 4 MG tablet, Take 1 tablet (4 mg total) by mouth daily as needed for nausea or vomiting., Disp: 30 tablet, Rfl: 1   oxyCODONE (ROXICODONE) 5 MG immediate release tablet, Take 1 tablet (5 mg total) by mouth every 4 (four) hours as needed for severe pain., Disp: 30 tablet, Rfl: 0  Observations/Objective: Patient is well-developed, well-nourished in no acute distress.  Resting comfortably at home.  Head is normocephalic, atraumatic.  No labored breathing.  Speech is clear and coherent with logical content.  Patient is alert and oriented at baseline.    Assessment and Plan: 1. Suspected UTI - nitrofurantoin, macrocrystal-monohydrate, (MACROBID) 100 MG capsule; Take 1 capsule (100 mg total) by mouth 2 (two) times daily.  Dispense: 10 capsule; Refill: 0  - Worsening symptoms.  - Will treat empirically with Macrobid - May use AZO for bladder spasms - Continue to push fluids.  - Seek in person evaluation for urine culture if symptoms do not improve or if they worsen.    Follow Up Instructions: I discussed the assessment and treatment plan with the patient. The patient was provided an opportunity to ask questions and all were answered. The patient agreed with the plan and demonstrated an understanding of the instructions.  A copy of instructions were sent to the patient via MyChart unless otherwise noted below.    The patient was advised to call back or seek an in-person evaluation if the symptoms worsen or if the condition fails to improve as anticipated.  Time:  I spent 8 minutes with the patient via telehealth technology discussing the above problems/concerns.    Margaretann Loveless, PA-C

## 2022-07-16 NOTE — Patient Instructions (Signed)
Mary Ramos, thank you for joining Margaretann Loveless, PA-C for today's virtual visit.  While this provider is not your primary care provider (PCP), if your PCP is located in our provider database this encounter information will be shared with them immediately following your visit.   A [place] MyChart account gives you access to today's visit and all your visits, tests, and labs performed at [facility] " click here if you don't have a [place] MyChart account or go to [email or web address]  Consent: (Patient) Mary Ramos provided verbal consent for this virtual visit at the beginning of the encounter.  Current Medications:  Current Outpatient Medications:    nitrofurantoin, macrocrystal-monohydrate, (MACROBID) 100 MG capsule, Take 1 capsule (100 mg total) by mouth 2 (two) times daily., Disp: 10 capsule, Rfl: 0   DULoxetine (CYMBALTA) 30 MG capsule, Take 30 mg by mouth daily., Disp: , Rfl:    ferrous gluconate (FERGON) 324 MG tablet, Take 324 mg by mouth daily., Disp: , Rfl:    ondansetron (ZOFRAN) 4 MG tablet, Take 1 tablet (4 mg total) by mouth daily as needed for nausea or vomiting., Disp: 30 tablet, Rfl: 1   oxyCODONE (ROXICODONE) 5 MG immediate release tablet, Take 1 tablet (5 mg total) by mouth every 4 (four) hours as needed for severe pain., Disp: 30 tablet, Rfl: 0   Medications ordered in this encounter:  Meds ordered this encounter  Medications   nitrofurantoin, macrocrystal-monohydrate, (MACROBID) 100 MG capsule    Sig: Take 1 capsule (100 mg total) by mouth 2 (two) times daily.    Dispense:  10 capsule    Refill:  0    Order Specific Question:   Supervising Provider    Answer:   Merrilee Jansky [id number]     *If you need refills on other medications prior to your next appointment, please contact your pharmacy*  Follow-Up: Call back or seek an in-person evaluation if the symptoms worsen or if the condition fails to improve as  anticipated.  [facility] [phone]  Other Instructions Urinary Tract Infection, Adult  A urinary tract infection (UTI) is an infection of any part of the urinary tract. The urinary tract includes the kidneys, ureters, bladder, and urethra. These organs make, store, and get rid of urine in the body. An upper UTI affects the ureters and kidneys. A lower UTI affects the bladder and urethra. What are the causes? Most urinary tract infections are caused by bacteria in your genital area around your urethra, where urine leaves your body. These bacteria grow and cause inflammation of your urinary tract. What increases the risk? You are more likely to develop this condition if: You have a urinary catheter that stays in place. You are not able to control when you urinate or have a bowel movement (incontinence). You are female and you: Use a spermicide or diaphragm for birth control. Have low estrogen levels. Are pregnant. You have certain genes that increase your risk. You are sexually active. You take antibiotic medicines. You have a condition that causes your flow of urine to slow down, such as: An enlarged prostate, if you are female. Blockage in your urethra. A kidney stone. A nerve condition that affects your bladder control (neurogenic bladder). Not getting enough to drink, or not urinating often. You have certain medical conditions, such as: Diabetes. A weak disease-fighting system (immunesystem). Sickle cell disease. Gout. Spinal cord injury. What are the signs or symptoms? Symptoms of this condition include: Needing  to urinate right away (urgency). Frequent urination. This may include small amounts of urine each time you urinate. Pain or burning with urination. Blood in the urine. Urine that smells bad or unusual. Trouble urinating. Cloudy urine. Vaginal discharge, if you are female. Pain in the abdomen or the lower back. You may also have: Vomiting or a  decreased appetite. Confusion. Irritability or tiredness. A fever or chills. Diarrhea. The first symptom in older adults may be confusion. In some cases, they may not have any symptoms until the infection has worsened. How is this diagnosed? This condition is diagnosed based on your medical history and a physical exam. You may also have other tests, including: Urine tests. Blood tests. Tests for STIs (sexually transmitted infections). If you have had more than one UTI, a cystoscopy or imaging studies may be done to determine the cause of the infections. How is this treated? Treatment for this condition includes: Antibiotic medicine. Over-the-counter medicines to treat discomfort. Drinking enough water to stay hydrated. If you have frequent infections or have other conditions such as a kidney stone, you may need to see a health care provider who specializes in the urinary tract (urologist). In rare cases, urinary tract infections can cause sepsis. Sepsis is a life-threatening condition that occurs when the body responds to an infection. Sepsis is treated in the hospital with IV antibiotics, fluids, and other medicines. Follow these instructions at home:  Medicines Take over-the-counter and prescription medicines only as told by your health care provider. If you were prescribed an antibiotic medicine, take it as told by your health care provider. Do not stop using the antibiotic even if you start to feel better. General instructions Make sure you: Empty your bladder often and completely. Do not hold urine for long periods of time. Empty your bladder after sex. Wipe from front to back after urinating or having a bowel movement if you are female. Use each tissue only one time when you wipe. Drink enough fluid to keep your urine pale yellow. Keep all follow-up visits. This is important. Contact a health care provider if: Your symptoms do not get better after 1-2 days. Your symptoms go  away and then return. Get help right away if: You have severe pain in your back or your lower abdomen. You have a fever or chills. You have nausea or vomiting. Summary A urinary tract infection (UTI) is an infection of any part of the urinary tract, which includes the kidneys, ureters, bladder, and urethra. Most urinary tract infections are caused by bacteria in your genital area. Treatment for this condition often includes antibiotic medicines. If you were prescribed an antibiotic medicine, take it as told by your health care provider. Do not stop using the antibiotic even if you start to feel better. Keep all follow-up visits. This is important. This information is not intended to replace advice given to you by your health care provider. Make sure you discuss any questions you have with your health care provider. Document Revised: 08/14/2019 Document Reviewed: 08/19/2019 Elsevier Patient Education  2024 Elsevier Inc.    If you have been instructed to have an in-person evaluation today at a local Urgent Care facility, please use the link below. It will take you to a list of all of our available [facility], including address, phone number and hours of operation. Please do not delay care.  [facility]  If you or a family member do not have a primary care provider, use the  link below to schedule a visit and establish care. When you choose a [place] primary care physician or advanced practice provider, you gain a long-term partner in health. Find a Primary Care Provider  Learn more about [place]'s in-office and virtual care options: [facility]

## 2022-09-03 ENCOUNTER — Telehealth: Payer: Medicaid Other | Admitting: Physician Assistant

## 2022-09-03 DIAGNOSIS — B3731 Acute candidiasis of vulva and vagina: Secondary | ICD-10-CM

## 2022-09-03 MED ORDER — FLUCONAZOLE 150 MG PO TABS
150.0000 mg | ORAL_TABLET | ORAL | 0 refills | Status: AC | PRN
Start: 2022-09-03 — End: ?

## 2022-09-03 NOTE — Patient Instructions (Signed)
Mary Ramos, thank you for joining Margaretann Loveless, PA-C for today's virtual visit.  While this provider is not your primary care provider (PCP), if your PCP is located in our provider database this encounter information will be shared with them immediately following your visit.   A [place] MyChart account gives you access to today's visit and all your visits, tests, and labs performed at [facility] " click here if you don't have a [place] MyChart account or go to [email or web address]  Consent: (Patient) Mary Ramos provided verbal consent for this virtual visit at the beginning of the encounter.  Current Medications:  Current Outpatient Medications:    fluconazole (DIFLUCAN) 150 MG tablet, Take 1 tablet (150 mg total) by mouth every 3 (three) days as needed., Disp: 2 tablet, Rfl: 0   DULoxetine (CYMBALTA) 30 MG capsule, Take 30 mg by mouth daily., Disp: , Rfl:    ferrous gluconate (FERGON) 324 MG tablet, Take 324 mg by mouth daily., Disp: , Rfl:    nitrofurantoin, macrocrystal-monohydrate, (MACROBID) 100 MG capsule, Take 1 capsule (100 mg total) by mouth 2 (two) times daily., Disp: 10 capsule, Rfl: 0   ondansetron (ZOFRAN) 4 MG tablet, Take 1 tablet (4 mg total) by mouth daily as needed for nausea or vomiting., Disp: 30 tablet, Rfl: 1   oxyCODONE (ROXICODONE) 5 MG immediate release tablet, Take 1 tablet (5 mg total) by mouth every 4 (four) hours as needed for severe pain., Disp: 30 tablet, Rfl: 0   Medications ordered in this encounter:  Meds ordered this encounter  Medications   fluconazole (DIFLUCAN) 150 MG tablet    Sig: Take 1 tablet (150 mg total) by mouth every 3 (three) days as needed.    Dispense:  2 tablet    Refill:  0    Order Specific Question:   Supervising Provider    Answer:   Merrilee Jansky [id number]     *If you need refills on other medications prior to your next appointment, please contact your pharmacy*  Follow-Up: Call  back or seek an in-person evaluation if the symptoms worsen or if the condition fails to improve as anticipated.  [facility] [phone]  Other Instructions Vaginal Yeast Infection, Adult  Vaginal yeast infection is a condition that causes vaginal discharge as well as soreness, swelling, and redness (inflammation) of the vagina. This is a common condition. Some women get this infection frequently. What are the causes? This condition is caused by a change in the normal balance of the yeast (Candida) and normal bacteria that live in the vagina. This change causes an overgrowth of yeast, which causes the inflammation. What increases the risk? The condition is more likely to develop in women who: Take antibiotic medicines. Have diabetes. Take birth control pills. Are pregnant. Douche often. Have a weak body defense system (immune system). Have been taking steroid medicines for a long time. Frequently wear tight clothing. What are the signs or symptoms? Symptoms of this condition include: White, thick, creamy vaginal discharge. Swelling, itching, redness, and irritation of the vagina. The lips of the vagina (labia) may be affected as well. Pain or a burning feeling while urinating. Pain during sex. How is this diagnosed? This condition is diagnosed based on: Your medical history. A physical exam. A pelvic exam. Your health care provider will examine a sample of your vaginal discharge under a microscope. Your health care provider may send this sample for testing to confirm the diagnosis. How  is this treated? This condition is treated with medicine. Medicines may be over-the-counter or prescription. You may be told to use one or more of the following: Medicine that is taken by mouth (orally). Medicine that is applied as a cream (topically). Medicine that is inserted directly into the vagina (suppository). Follow these instructions at home: Take or apply over-the-counter  and prescription medicines only as told by your health care provider. Do not use tampons until your health care provider approves. Do not have sex until your infection has cleared. Sex can prolong or worsen your symptoms of infection. Ask your health care provider when it is safe to resume sexual activity. Keep all follow-up visits. This is important. How is this prevented?  Do not wear tight clothes, such as pantyhose or tight pants. Wear breathable cotton underwear. Do not use douches, perfumed soap, creams, or powders. Wipe from front to back after using the toilet. If you have diabetes, keep your blood sugar levels under control. Ask your health care provider for other ways to prevent yeast infections. Contact a health care provider if: You have a fever. Your symptoms go away and then return. Your symptoms do not get better with treatment. Your symptoms get worse. You have new symptoms. You develop blisters in or around your vagina. You have blood coming from your vagina and it is not your menstrual period. You develop pain in your abdomen. Summary Vaginal yeast infection is a condition that causes discharge as well as soreness, swelling, and redness (inflammation) of the vagina. This condition is treated with medicine. Medicines may be over-the-counter or prescription. Take or apply over-the-counter and prescription medicines only as told by your health care provider. Do not douche. Resume sexual activity or use of tampons as instructed by your health care provider. Contact a health care provider if your symptoms do not get better with treatment or your symptoms go away and then return. This information is not intended to replace advice given to you by your health care provider. Make sure you discuss any questions you have with your health care provider. Document Revised: 03/26/2020 Document Reviewed: 03/26/2020 Elsevier Patient Education  2024 Elsevier Inc.    If you have been  instructed to have an in-person evaluation today at a local Urgent Care facility, please use the link below. It will take you to a list of all of our available [place] [facility], including address, phone number and hours of operation. Please do not delay care.  [facility]  If you or a family member do not have a primary care provider, use the link below to schedule a visit and establish care. When you choose a [place] primary care physician or advanced practice provider, you gain a long-term partner in health. Find a Primary Care Provider  Learn more about [facility]'s in-office and virtual care options:  - Get Care Now

## 2022-09-03 NOTE — Progress Notes (Signed)
Virtual Visit Consent   Mary Ramos, you are scheduled for a virtual visit with a [facility] provider today. Just as with appointments in the office, your consent must be obtained to participate. Your consent will be active for this visit and any virtual visit you may have with one of our providers in the next 365 days. If you have a MyChart account, a copy of this consent can be sent to you electronically.  As this is a virtual visit, video technology does not allow for your provider to perform a traditional examination. This may limit your provider's ability to fully assess your condition. If your provider identifies any concerns that need to be evaluated in person or the need to arrange testing (such as labs, EKG, etc.), we will make arrangements to do so. Although advances in technology are sophisticated, we cannot ensure that it will always work on either your end or our end. If the connection with a video visit is poor, the visit may have to be switched to a telephone visit. With either a video or telephone visit, we are not always able to ensure that we have a secure connection.  By engaging in this virtual visit, you consent to the provision of healthcare and authorize for your insurance to be billed (if applicable) for the services provided during this visit. Depending on your insurance coverage, you may receive a charge related to this service.  I need to obtain your verbal consent now. Are you willing to proceed with your visit today? Mary Ramos has provided verbal consent on 09/03/2022 for a virtual visit (video or telephone). Mary Loveless, PA-C  Date: 09/03/2022 4:16 PM  Virtual Visit via Video Note   I, Mary Ramos, connected with  Mary Ramos  ([id number], [birth year]) on 09/03/22 at  4:15 PM EDT by a video-enabled telemedicine application and verified that I am speaking with the correct person using two identifiers.  Location: Patient: Virtual Visit Location  Patient: Home Provider: Virtual Visit Location Provider: Home Office   I discussed the limitations of evaluation and management by telemedicine and the availability of in person appointments. The patient expressed understanding and agreed to proceed.    History of Present Illness: Mary Ramos is a 52 y.o. who identifies as a female who was assigned female at birth, and is being seen today for possible yeast infection.  HPI: Vaginal Itching The patient's primary symptoms include genital itching. The patient's pertinent negatives include no genital odor, missed menses, pelvic pain or vaginal bleeding. This is a new problem. The current episode started in the past 7 days (2 days). The problem occurs constantly. The problem has been gradually worsening. The patient is experiencing no pain. She is not pregnant. Pertinent negatives include no chills, dysuria, fever, frequency, hematuria, nausea, painful intercourse or sore throat. Nothing aggravates the symptoms.     Problems:  Patient Active Problem List   Diagnosis Date Noted   Gross hematuria 01/23/2022   Pancreatitis 03/31/2020   Current moderate episode of major depressive disorder without prior episode (HCC) 11/23/2017   Nephrolithiasis 11/23/2017   Cervical spinal stenosis 02/11/2017   Spondylosis of cervical region without myelopathy or radiculopathy 11/04/2016   Essential hypertension 07/01/2016   Tobacco dependence 07/01/2016    Allergies:  Allergies  Allergen Reactions   Nickel Hives   Medications:  Current Outpatient Medications:    fluconazole (DIFLUCAN) 150 MG tablet, Take 1 tablet (150 mg total) by mouth every 3 (three)  days as needed., Disp: 2 tablet, Rfl: 0   DULoxetine (CYMBALTA) 30 MG capsule, Take 30 mg by mouth daily., Disp: , Rfl:    ferrous gluconate (FERGON) 324 MG tablet, Take 324 mg by mouth daily., Disp: , Rfl:    nitrofurantoin, macrocrystal-monohydrate, (MACROBID) 100 MG capsule, Take 1 capsule (100 mg  total) by mouth 2 (two) times daily., Disp: 10 capsule, Rfl: 0   ondansetron (ZOFRAN) 4 MG tablet, Take 1 tablet (4 mg total) by mouth daily as needed for nausea or vomiting., Disp: 30 tablet, Rfl: 1   oxyCODONE (ROXICODONE) 5 MG immediate release tablet, Take 1 tablet (5 mg total) by mouth every 4 (four) hours as needed for severe pain., Disp: 30 tablet, Rfl: 0  Observations/Objective: Patient is well-developed, well-nourished in no acute distress.  Resting comfortably at home.  Head is normocephalic, atraumatic.  No labored breathing.  Speech is clear and coherent with logical content.  Patient is alert and oriented at baseline.    Assessment and Plan: 1. Yeast vaginitis - fluconazole (DIFLUCAN) 150 MG tablet; Take 1 tablet (150 mg total) by mouth every 3 (three) days as needed.  Dispense: 2 tablet; Refill: 0  - Symptoms consistent with yeast vaginitis - Fluconazole prescribed - Limit bubble baths, scented lotions/soaps/detergents - Limit tight fitting clothing - Seek on person evaluation if not improving or if symptoms worsen   Follow Up Instructions: I discussed the assessment and treatment plan with the patient. The patient was provided an opportunity to ask questions and all were answered. The patient agreed with the plan and demonstrated an understanding of the instructions.  A copy of instructions were sent to the patient via MyChart unless otherwise noted below.    The patient was advised to call back or seek an in-person evaluation if the symptoms worsen or if the condition fails to improve as anticipated.  Time:  I spent 7 minutes with the patient via telehealth technology discussing the above problems/concerns.    Mary Loveless, PA-C

## 2022-09-18 ENCOUNTER — Telehealth: Payer: Medicaid Other | Admitting: Physician Assistant

## 2022-09-18 ENCOUNTER — Telehealth: Payer: Medicaid Other | Admitting: Family Medicine

## 2022-09-18 DIAGNOSIS — N760 Acute vaginitis: Secondary | ICD-10-CM

## 2022-09-18 NOTE — Progress Notes (Signed)
   Thank you for the details you included in the comment boxes. Those details are very helpful in determining the best course of treatment for you and help [place] to provide the best care.Because you have already been treated via e-visit with continued symptoms and need for further discussion/assessment, we recommend that you convert this visit to a video visit in order for the provider to better assess what is going on.  The provider will be able to give you a more accurate diagnosis and treatment plan if we can more freely discuss your symptoms and with the addition of a virtual examination.   If you convert to a video visit, we will bill your insurance (similar to an office visit) and you will not be charged for this e-Visit. You will be able to stay at home and speak with the first available [facility] advanced practice provider. The link to do a video visit is in the drop down Menu tab of your Welcome screen in MyChart.

## 2022-09-18 NOTE — Progress Notes (Signed)
[place]   Did EV as well, needs in person after having recurrent vaginitis. Was on UTI medication, and yeast treatment but is now having more discharge and an odor. Yeast infection meds did not clear her symptoms.  Patient acknowledged agreement and understanding of the plan.

## 2022-09-18 NOTE — Patient Instructions (Signed)
Please follow up in person to get a quick swab done to make sure we get you the treatment you need.

## 2022-10-04 DIAGNOSIS — N760 Acute vaginitis: Secondary | ICD-10-CM | POA: Diagnosis not present

## 2022-10-04 DIAGNOSIS — N3001 Acute cystitis with hematuria: Secondary | ICD-10-CM | POA: Diagnosis not present

## 2022-10-30 ENCOUNTER — Ambulatory Visit (HOSPITAL_COMMUNITY)
Admission: RE | Admit: 2022-10-30 | Discharge: 2022-10-30 | Disposition: A | Discharge status: Home / Self Care | Payer: Medicaid Other | Source: Ambulatory Visit | Attending: Urology | Admitting: Urology

## 2022-10-30 DIAGNOSIS — N200 Calculus of kidney: Secondary | ICD-10-CM | POA: Insufficient documentation

## 2022-10-31 ENCOUNTER — Ambulatory Visit: Payer: Medicaid Other | Admitting: Urology

## 2022-10-31 VITALS — BP 126/74 | HR 74

## 2022-10-31 DIAGNOSIS — Z08 Encounter for follow-up examination after completed treatment for malignant neoplasm: Secondary | ICD-10-CM

## 2022-10-31 DIAGNOSIS — N200 Calculus of kidney: Secondary | ICD-10-CM

## 2022-10-31 DIAGNOSIS — Z87442 Personal history of urinary calculi: Secondary | ICD-10-CM

## 2022-10-31 LAB — MICROSCOPIC EXAMINATION: Epithelial Cells (non renal): >10 /[HPF] — AB (ref 0–10)

## 2022-10-31 LAB — URINALYSIS, ROUTINE W REFLEX MICROSCOPIC
Bilirubin, UA: NEGATIVE
Glucose, UA: NEGATIVE
Ketones, UA: NEGATIVE
Leukocytes,UA: NEGATIVE
Nitrite, UA: NEGATIVE
Specific Gravity, UA: 1.03 (ref 1.005–1.030)
Urobilinogen, Ur: 1 mg/dL (ref 0.2–1.0)
pH, UA: 6 (ref 5.0–7.5)

## 2022-10-31 NOTE — Progress Notes (Signed)
10/31/2022 1:04 PM   Mary Ramos 1970-04-27 [id number]  Referring provider: Tracey Harries, MD [address] [place],  [place] [address]  nephrolithiasis   HPI: [name] is a 52yo here for followup for nephrolithiasis. Renal US shows no calculi and no hydronephrosis.  She denies any flank pain. No issues urinating. No dysuria or hematuria     PMH: Past Medical History:  Diagnosis Date   History of abnormal cervical Pap smear 2015   ASCUS with +HR HPV   History of seizures as a child    father dropped pt as a baby. Hasnt had a seizure in over 30 years.   Hypertension    Pancreatitis     Surgical History: Past Surgical History:  Procedure Laterality Date   CYSTOSCOPY N/A 08/01/2020   Procedure: CYSTOSCOPY;  Surgeon: Jerene Bears, MD;  Location: [facility] OR;  Service: Gynecology;  Laterality: N/A;   CYSTOSCOPY WITH RETROGRADE PYELOGRAM, URETEROSCOPY AND STENT PLACEMENT Bilateral 01/23/2022   Procedure: CYSTOSCOPY WITH RETROGRADE PYELOGRAM, URETEROSCOPY;  Surgeon: Malen Gauze, MD;  Location: [facility];  Service: Urology;  Laterality: Bilateral;   HERNIA REPAIR  1975   HOLMIUM LASER APPLICATION Left 01/23/2022   Procedure: HOLMIUM LASER APPLICATION;  Surgeon: Malen Gauze, MD;  Location: [facility];  Service: Urology;  Laterality: Left;   LEEP     years ago   NECK SURGERY  2017   with right shoulder surgery   TOTAL LAPAROSCOPIC HYSTERECTOMY WITH BILATERAL SALPINGO OOPHORECTOMY Bilateral 08/01/2020   Procedure: TOTAL LAPAROSCOPIC HYSTERECTOMY WITH BILATERAL SALPINGO OOPHORECTOMY;  Surgeon: Jerene Bears, MD;  Location: [facility] OR;  Service: Gynecology;  Laterality: Bilateral;   URETEROSCOPY Left 01/23/2022   Procedure: URETEROSCOPY with stent placement;  Surgeon: Malen Gauze, MD;  Location: [facility];  Service: Urology;  Laterality: Left;    Home Medications:  Allergies as of 10/31/2022       Reactions   Nickel Hives        Medication List         Accurate as of October 31, 2022  1:04 PM. If you have any questions, ask your nurse or doctor.          DULoxetine 30 MG capsule Commonly known as: CYMBALTA Take 30 mg by mouth daily.   ferrous gluconate 324 MG tablet Commonly known as: FERGON Take 324 mg by mouth daily.   fluconazole 150 MG tablet Commonly known as: DIFLUCAN Take 1 tablet (150 mg total) by mouth every 3 (three) days as needed.   nitrofurantoin (macrocrystal-monohydrate) 100 MG capsule Commonly known as: Macrobid Take 1 capsule (100 mg total) by mouth 2 (two) times daily.   ondansetron 4 MG tablet Commonly known as: Zofran Take 1 tablet (4 mg total) by mouth daily as needed for nausea or vomiting.   oxyCODONE 5 MG immediate release tablet Commonly known as: Roxicodone Take 1 tablet (5 mg total) by mouth every 4 (four) hours as needed for severe pain.        Allergies:  Allergies  Allergen Reactions   Nickel Hives    Family History: Family History  Problem Relation Age of Onset   Lupus Mother    Pancreatic cancer Father     Social History:  reports that she has been smoking cigarettes. She has never used smokeless tobacco. She reports current drug use. Drug: Marijuana. She reports that she does not drink alcohol.  ROS: All other review of systems were reviewed and are  negative except what is noted above in HPI  Physical Exam: BP 126/74   Pulse 74   LMP 03/12/2020   Constitutional:  Alert and oriented, No acute distress. HEENT: [place] AT, moist mucus membranes.  Trachea midline, no masses. Cardiovascular: No clubbing, cyanosis, or edema. Respiratory: Normal respiratory effort, no increased work of breathing. GI: Abdomen is soft, nontender, nondistended, no abdominal masses GU: No CVA tenderness.  Lymph: No cervical or inguinal lymphadenopathy. Skin: No rashes, bruises or suspicious lesions. Neurologic: Grossly intact, no focal deficits, moving all 4 extremities. Psychiatric: Normal mood  and affect.  Laboratory Data: Lab Results  Component Value Date   WBC 6.9 12/28/2021   HGB 11.7 (L) 12/28/2021   HCT 35.8 (L) 12/28/2021   MCV 84.0 12/28/2021   PLT 286 12/28/2021    Lab Results  Component Value Date   CREATININE 1.30 (H) 12/28/2021    No results found for: "PSA"  No results found for: "TESTOSTERONE"  No results found for: "HGBA1C"  Urinalysis    Component Value Date/Time   COLORURINE YELLOW 12/28/2021 1837   APPEARANCEUR Clear 04/28/2022 0942   LABSPEC 1.020 12/28/2021 1837   PHURINE 5.0 12/28/2021 1837   GLUCOSEU Negative 04/28/2022 0942   HGBUR LARGE (A) 12/28/2021 1837   BILIRUBINUR Negative 04/28/2022 0942   KETONESUR NEGATIVE 12/28/2021 1837   PROTEINUR Negative 04/28/2022 0942   PROTEINUR NEGATIVE 12/28/2021 1837   UROBILINOGEN 1.0 12/18/2021 1104   UROBILINOGEN 1.0 11/30/2013 1401   NITRITE Negative 04/28/2022 0942   NITRITE NEGATIVE 12/28/2021 1837   LEUKOCYTESUR Negative 04/28/2022 0942   LEUKOCYTESUR NEGATIVE 12/28/2021 1837    Lab Results  Component Value Date   LABMICR See below: 04/28/2022   WBCUA 0-5 04/28/2022   LABEPIT 0-10 04/28/2022   BACTERIA None seen 04/28/2022    Pertinent Imaging: Renal US 10/30/2022: Images reviewed and discussed with the patient  Results for orders placed in visit on 04/28/22  DG Abd 1 View  Narrative CLINICAL DATA:  Nephrolithiasis.  Recent surgery.  EXAM: ABDOMEN - 1 VIEW  COMPARISON:  MRI abdomen 12/17/2020.  CT abdomen pelvis 03/31/2020.  FINDINGS: Large amount of stool noted throughout the colon making evaluation for stone disease difficult. No renal stones identified. Multiple punctate calcifications noted in the pelvis, these are most likely phleboliths, distal ureteral stones cannot be excluded. No acute bony abnormality identified.  IMPRESSION: Large amount of stool noted throughout the colon making evaluation for stone disease difficult. No renal stones identified.  Multiple punctate calcifications noted in the pelvis, these are most likely phleboliths, distal ureteral stones cannot be excluded.   Electronically Signed By: Maisie Fus  Register M.D. On: 04/29/2022 11:44  No results found for this or any previous visit.  No results found for this or any previous visit.  No results found for this or any previous visit.  No results found for this or any previous visit.  No valid procedures specified. No results found for this or any previous visit.  No results found for this or any previous visit.   Assessment & Plan:    1. Nephrolithiasis Followup 1 year with renal US - Urinalysis, Routine w reflex microscopic   No follow-ups on file.  Wilkie Aye, MD  [facility] [place]

## 2022-11-03 DIAGNOSIS — M5412 Radiculopathy, cervical region: Secondary | ICD-10-CM | POA: Diagnosis not present

## 2022-11-03 DIAGNOSIS — F411 Generalized anxiety disorder: Secondary | ICD-10-CM | POA: Diagnosis not present

## 2022-11-03 DIAGNOSIS — F410 Panic disorder [episodic paroxysmal anxiety] without agoraphobia: Secondary | ICD-10-CM | POA: Diagnosis not present

## 2022-11-03 DIAGNOSIS — G8929 Other chronic pain: Secondary | ICD-10-CM | POA: Diagnosis not present

## 2022-11-03 DIAGNOSIS — F321 Major depressive disorder, single episode, moderate: Secondary | ICD-10-CM | POA: Diagnosis not present

## 2022-11-03 DIAGNOSIS — M25511 Pain in right shoulder: Secondary | ICD-10-CM | POA: Diagnosis not present

## 2022-11-04 ENCOUNTER — Encounter: Payer: Self-pay | Admitting: Urology

## 2022-11-04 NOTE — Patient Instructions (Signed)

## 2022-11-12 DIAGNOSIS — Z9889 Other specified postprocedural states: Secondary | ICD-10-CM | POA: Diagnosis not present

## 2022-11-12 DIAGNOSIS — T84216A Breakdown (mechanical) of internal fixation device of vertebrae, initial encounter: Secondary | ICD-10-CM | POA: Diagnosis not present

## 2022-11-12 DIAGNOSIS — M542 Cervicalgia: Secondary | ICD-10-CM | POA: Diagnosis not present

## 2022-11-12 DIAGNOSIS — R52 Pain, unspecified: Secondary | ICD-10-CM | POA: Diagnosis not present

## 2022-11-12 DIAGNOSIS — M25511 Pain in right shoulder: Secondary | ICD-10-CM | POA: Diagnosis not present

## 2022-11-21 DIAGNOSIS — M47812 Spondylosis without myelopathy or radiculopathy, cervical region: Secondary | ICD-10-CM | POA: Diagnosis not present

## 2022-11-21 DIAGNOSIS — T84216A Breakdown (mechanical) of internal fixation device of vertebrae, initial encounter: Secondary | ICD-10-CM | POA: Diagnosis not present

## 2022-11-28 DIAGNOSIS — M542 Cervicalgia: Secondary | ICD-10-CM | POA: Diagnosis not present

## 2022-11-30 DIAGNOSIS — M50123 Cervical disc disorder at C6-C7 level with radiculopathy: Secondary | ICD-10-CM | POA: Diagnosis not present

## 2022-11-30 DIAGNOSIS — M4722 Other spondylosis with radiculopathy, cervical region: Secondary | ICD-10-CM | POA: Diagnosis not present

## 2022-11-30 DIAGNOSIS — M5412 Radiculopathy, cervical region: Secondary | ICD-10-CM | POA: Diagnosis not present

## 2022-11-30 DIAGNOSIS — Z981 Arthrodesis status: Secondary | ICD-10-CM | POA: Diagnosis not present

## 2022-11-30 DIAGNOSIS — M4802 Spinal stenosis, cervical region: Secondary | ICD-10-CM | POA: Diagnosis not present

## 2022-12-15 DIAGNOSIS — M4802 Spinal stenosis, cervical region: Secondary | ICD-10-CM | POA: Diagnosis not present

## 2022-12-15 DIAGNOSIS — T84216A Breakdown (mechanical) of internal fixation device of vertebrae, initial encounter: Secondary | ICD-10-CM | POA: Diagnosis not present

## 2022-12-15 DIAGNOSIS — M5412 Radiculopathy, cervical region: Secondary | ICD-10-CM | POA: Diagnosis not present

## 2022-12-15 DIAGNOSIS — M4012 Other secondary kyphosis, cervical region: Secondary | ICD-10-CM | POA: Diagnosis not present

## 2022-12-30 DIAGNOSIS — R310 Gross hematuria: Secondary | ICD-10-CM | POA: Diagnosis not present

## 2023-04-15 DIAGNOSIS — R92343 Mammographic extreme density, bilateral breasts: Secondary | ICD-10-CM | POA: Diagnosis not present

## 2023-04-15 DIAGNOSIS — Z1231 Encounter for screening mammogram for malignant neoplasm of breast: Secondary | ICD-10-CM | POA: Diagnosis not present

## 2023-04-30 DIAGNOSIS — K769 Liver disease, unspecified: Secondary | ICD-10-CM | POA: Diagnosis not present

## 2023-04-30 DIAGNOSIS — R1031 Right lower quadrant pain: Secondary | ICD-10-CM | POA: Diagnosis not present

## 2023-04-30 DIAGNOSIS — R319 Hematuria, unspecified: Secondary | ICD-10-CM | POA: Diagnosis not present

## 2023-04-30 DIAGNOSIS — R188 Other ascites: Secondary | ICD-10-CM | POA: Diagnosis not present

## 2023-04-30 DIAGNOSIS — I880 Nonspecific mesenteric lymphadenitis: Secondary | ICD-10-CM | POA: Diagnosis not present

## 2023-04-30 DIAGNOSIS — M542 Cervicalgia: Secondary | ICD-10-CM | POA: Diagnosis not present

## 2023-05-04 DIAGNOSIS — R1032 Left lower quadrant pain: Secondary | ICD-10-CM | POA: Diagnosis not present

## 2023-05-20 DIAGNOSIS — R935 Abnormal findings on diagnostic imaging of other abdominal regions, including retroperitoneum: Secondary | ICD-10-CM | POA: Diagnosis not present

## 2023-06-05 DIAGNOSIS — R933 Abnormal findings on diagnostic imaging of other parts of digestive tract: Secondary | ICD-10-CM | POA: Diagnosis not present

## 2023-06-05 DIAGNOSIS — Z538 Procedure and treatment not carried out for other reasons: Secondary | ICD-10-CM | POA: Diagnosis not present

## 2023-06-17 DIAGNOSIS — K648 Other hemorrhoids: Secondary | ICD-10-CM | POA: Diagnosis not present

## 2023-06-17 DIAGNOSIS — Z1211 Encounter for screening for malignant neoplasm of colon: Secondary | ICD-10-CM | POA: Diagnosis not present

## 2023-07-15 DIAGNOSIS — R935 Abnormal findings on diagnostic imaging of other abdominal regions, including retroperitoneum: Secondary | ICD-10-CM | POA: Diagnosis not present

## 2023-07-16 DIAGNOSIS — M40202 Unspecified kyphosis, cervical region: Secondary | ICD-10-CM | POA: Diagnosis not present

## 2023-07-16 DIAGNOSIS — M40292 Other kyphosis, cervical region: Secondary | ICD-10-CM | POA: Diagnosis not present

## 2023-07-16 DIAGNOSIS — K8591 Acute pancreatitis with uninfected necrosis, unspecified: Secondary | ICD-10-CM | POA: Diagnosis not present

## 2023-07-16 DIAGNOSIS — F17200 Nicotine dependence, unspecified, uncomplicated: Secondary | ICD-10-CM | POA: Diagnosis not present

## 2023-07-16 DIAGNOSIS — Z01818 Encounter for other preprocedural examination: Secondary | ICD-10-CM | POA: Diagnosis not present

## 2023-07-16 DIAGNOSIS — I10 Essential (primary) hypertension: Secondary | ICD-10-CM | POA: Diagnosis not present

## 2023-07-16 DIAGNOSIS — F17210 Nicotine dependence, cigarettes, uncomplicated: Secondary | ICD-10-CM | POA: Diagnosis not present

## 2023-07-16 DIAGNOSIS — R591 Generalized enlarged lymph nodes: Secondary | ICD-10-CM | POA: Diagnosis not present

## 2023-07-16 DIAGNOSIS — G40419 Other generalized epilepsy and epileptic syndromes, intractable, without status epilepticus: Secondary | ICD-10-CM | POA: Diagnosis not present

## 2023-07-29 DIAGNOSIS — M50223 Other cervical disc displacement at C6-C7 level: Secondary | ICD-10-CM | POA: Diagnosis not present

## 2023-07-29 DIAGNOSIS — M40202 Unspecified kyphosis, cervical region: Secondary | ICD-10-CM | POA: Diagnosis not present

## 2023-07-29 DIAGNOSIS — M4802 Spinal stenosis, cervical region: Secondary | ICD-10-CM | POA: Diagnosis not present

## 2023-07-29 DIAGNOSIS — M47812 Spondylosis without myelopathy or radiculopathy, cervical region: Secondary | ICD-10-CM | POA: Diagnosis not present

## 2023-07-29 DIAGNOSIS — Z981 Arthrodesis status: Secondary | ICD-10-CM | POA: Diagnosis not present

## 2023-07-29 DIAGNOSIS — M50323 Other cervical disc degeneration at C6-C7 level: Secondary | ICD-10-CM | POA: Diagnosis not present

## 2023-07-30 DIAGNOSIS — F410 Panic disorder [episodic paroxysmal anxiety] without agoraphobia: Secondary | ICD-10-CM | POA: Diagnosis not present

## 2023-07-30 DIAGNOSIS — M4722 Other spondylosis with radiculopathy, cervical region: Secondary | ICD-10-CM | POA: Diagnosis not present

## 2023-07-30 DIAGNOSIS — M4712 Other spondylosis with myelopathy, cervical region: Secondary | ICD-10-CM | POA: Diagnosis not present

## 2023-07-30 DIAGNOSIS — M4012 Other secondary kyphosis, cervical region: Secondary | ICD-10-CM | POA: Diagnosis not present

## 2023-07-30 DIAGNOSIS — T84296A Other mechanical complication of internal fixation device of vertebrae, initial encounter: Secondary | ICD-10-CM | POA: Diagnosis not present

## 2023-07-30 DIAGNOSIS — Z91048 Other nonmedicinal substance allergy status: Secondary | ICD-10-CM | POA: Diagnosis not present

## 2023-07-30 DIAGNOSIS — J329 Chronic sinusitis, unspecified: Secondary | ICD-10-CM | POA: Diagnosis not present

## 2023-07-30 DIAGNOSIS — F17210 Nicotine dependence, cigarettes, uncomplicated: Secondary | ICD-10-CM | POA: Diagnosis not present

## 2023-07-30 DIAGNOSIS — Z981 Arthrodesis status: Secondary | ICD-10-CM | POA: Diagnosis not present

## 2023-07-30 DIAGNOSIS — M50323 Other cervical disc degeneration at C6-C7 level: Secondary | ICD-10-CM | POA: Diagnosis not present

## 2023-07-30 DIAGNOSIS — Z888 Allergy status to other drugs, medicaments and biological substances status: Secondary | ICD-10-CM | POA: Diagnosis not present

## 2023-07-30 DIAGNOSIS — M4003 Postural kyphosis, cervicothoracic region: Secondary | ICD-10-CM | POA: Diagnosis not present

## 2023-07-30 DIAGNOSIS — M40202 Unspecified kyphosis, cervical region: Secondary | ICD-10-CM | POA: Diagnosis not present

## 2023-07-30 DIAGNOSIS — M47812 Spondylosis without myelopathy or radiculopathy, cervical region: Secondary | ICD-10-CM | POA: Diagnosis not present

## 2023-07-30 DIAGNOSIS — I10 Essential (primary) hypertension: Secondary | ICD-10-CM | POA: Diagnosis not present

## 2023-07-30 DIAGNOSIS — M4802 Spinal stenosis, cervical region: Secondary | ICD-10-CM | POA: Diagnosis not present

## 2023-07-30 DIAGNOSIS — M50223 Other cervical disc displacement at C6-C7 level: Secondary | ICD-10-CM | POA: Diagnosis not present

## 2023-08-14 DIAGNOSIS — T84216A Breakdown (mechanical) of internal fixation device of vertebrae, initial encounter: Secondary | ICD-10-CM | POA: Diagnosis not present

## 2023-08-14 DIAGNOSIS — Z981 Arthrodesis status: Secondary | ICD-10-CM | POA: Diagnosis not present

## 2023-08-14 DIAGNOSIS — M4012 Other secondary kyphosis, cervical region: Secondary | ICD-10-CM | POA: Diagnosis not present

## 2023-08-14 DIAGNOSIS — M5412 Radiculopathy, cervical region: Secondary | ICD-10-CM | POA: Diagnosis not present

## 2023-08-17 DIAGNOSIS — Z981 Arthrodesis status: Secondary | ICD-10-CM | POA: Diagnosis not present

## 2023-08-17 DIAGNOSIS — Z09 Encounter for follow-up examination after completed treatment for conditions other than malignant neoplasm: Secondary | ICD-10-CM | POA: Diagnosis not present

## 2023-08-17 DIAGNOSIS — F17200 Nicotine dependence, unspecified, uncomplicated: Secondary | ICD-10-CM | POA: Diagnosis not present

## 2023-08-17 DIAGNOSIS — K219 Gastro-esophageal reflux disease without esophagitis: Secondary | ICD-10-CM | POA: Diagnosis not present

## 2023-08-17 DIAGNOSIS — I10 Essential (primary) hypertension: Secondary | ICD-10-CM | POA: Diagnosis not present

## 2023-09-10 DIAGNOSIS — M5412 Radiculopathy, cervical region: Secondary | ICD-10-CM | POA: Diagnosis not present

## 2023-09-10 DIAGNOSIS — M542 Cervicalgia: Secondary | ICD-10-CM | POA: Diagnosis not present

## 2023-09-10 DIAGNOSIS — M4012 Other secondary kyphosis, cervical region: Secondary | ICD-10-CM | POA: Diagnosis not present

## 2023-09-10 DIAGNOSIS — T84216A Breakdown (mechanical) of internal fixation device of vertebrae, initial encounter: Secondary | ICD-10-CM | POA: Diagnosis not present

## 2023-09-12 DIAGNOSIS — Z113 Encounter for screening for infections with a predominantly sexual mode of transmission: Secondary | ICD-10-CM | POA: Diagnosis not present

## 2023-09-12 DIAGNOSIS — R109 Unspecified abdominal pain: Secondary | ICD-10-CM | POA: Diagnosis not present

## 2023-09-14 DIAGNOSIS — T84216A Breakdown (mechanical) of internal fixation device of vertebrae, initial encounter: Secondary | ICD-10-CM | POA: Diagnosis not present

## 2023-09-14 DIAGNOSIS — M542 Cervicalgia: Secondary | ICD-10-CM | POA: Diagnosis not present

## 2023-09-14 DIAGNOSIS — M5412 Radiculopathy, cervical region: Secondary | ICD-10-CM | POA: Diagnosis not present

## 2023-09-14 DIAGNOSIS — M4012 Other secondary kyphosis, cervical region: Secondary | ICD-10-CM | POA: Diagnosis not present

## 2023-09-15 DIAGNOSIS — R590 Localized enlarged lymph nodes: Secondary | ICD-10-CM | POA: Diagnosis not present

## 2023-10-09 DIAGNOSIS — R590 Localized enlarged lymph nodes: Secondary | ICD-10-CM | POA: Diagnosis not present

## 2023-10-26 ENCOUNTER — Ambulatory Visit (HOSPITAL_COMMUNITY): Attending: Urology

## 2023-10-30 ENCOUNTER — Ambulatory Visit: Payer: Medicaid Other | Admitting: Urology

## 2023-11-03 DIAGNOSIS — Z4889 Encounter for other specified surgical aftercare: Secondary | ICD-10-CM | POA: Diagnosis not present

## 2024-01-08 ENCOUNTER — Telehealth: Payer: Self-pay

## 2024-01-08 DIAGNOSIS — N200 Calculus of kidney: Secondary | ICD-10-CM

## 2024-01-08 NOTE — Telephone Encounter (Signed)
 Left a voice message 01-08-2024  Needing to discuss imaging to be done at [facility].  Please advise.  Call:  [phone]

## 2024-01-08 NOTE — Telephone Encounter (Signed)
 Called pt to let her know we have put in her order for US  and gave her centralized scheduling phone number to schedule imaging

## 2024-01-11 ENCOUNTER — Ambulatory Visit (HOSPITAL_COMMUNITY): Attending: Urology

## 2024-01-11 ENCOUNTER — Ambulatory Visit: Admitting: Urology

## 2024-01-27 ENCOUNTER — Ambulatory Visit (HOSPITAL_COMMUNITY)
Admission: RE | Admit: 2024-01-27 | Discharge: 2024-01-27 | Disposition: A | Discharge status: Home / Self Care | Source: Ambulatory Visit | Attending: Urology | Admitting: Urology

## 2024-01-27 DIAGNOSIS — N200 Calculus of kidney: Secondary | ICD-10-CM | POA: Insufficient documentation

## 2024-07-20 ENCOUNTER — Ambulatory Visit: Admitting: Urology
